# Patient Record
Sex: Male | Born: 1935 | Race: White | Hispanic: No | Smoking: Never smoker
Health system: Southern US, Community
[De-identification: ages and names within clinical notes are randomized; demographics above are authoritative.]

## PROBLEM LIST (undated history)

## (undated) DIAGNOSIS — G8929 Other chronic pain: Secondary | ICD-10-CM

## (undated) DIAGNOSIS — I714 Abdominal aortic aneurysm, without rupture, unspecified: Secondary | ICD-10-CM

## (undated) DIAGNOSIS — M545 Low back pain, unspecified: Secondary | ICD-10-CM

## (undated) DIAGNOSIS — C801 Malignant (primary) neoplasm, unspecified: Secondary | ICD-10-CM

## (undated) DIAGNOSIS — I2510 Atherosclerotic heart disease of native coronary artery without angina pectoris: Secondary | ICD-10-CM

## (undated) DIAGNOSIS — I10 Essential (primary) hypertension: Secondary | ICD-10-CM

## (undated) DIAGNOSIS — C679 Malignant neoplasm of bladder, unspecified: Secondary | ICD-10-CM

## (undated) DIAGNOSIS — E785 Hyperlipidemia, unspecified: Secondary | ICD-10-CM

## (undated) DIAGNOSIS — J984 Other disorders of lung: Secondary | ICD-10-CM

## (undated) DIAGNOSIS — K219 Gastro-esophageal reflux disease without esophagitis: Secondary | ICD-10-CM

## (undated) DIAGNOSIS — I739 Peripheral vascular disease, unspecified: Secondary | ICD-10-CM

## (undated) DIAGNOSIS — R1310 Dysphagia, unspecified: Secondary | ICD-10-CM

## (undated) HISTORY — DX: Other disorders of lung: J98.4

## (undated) HISTORY — PX: ABDOMINAL AORTIC ANEURYSM REPAIR: SUR1152

## (undated) HISTORY — DX: Hyperlipidemia, unspecified: E78.5

## (undated) HISTORY — DX: Essential (primary) hypertension: I10

## (undated) HISTORY — DX: Abdominal aortic aneurysm, without rupture: I71.4

## (undated) HISTORY — DX: Other chronic pain: G89.29

## (undated) HISTORY — DX: Low back pain: M54.5

## (undated) HISTORY — PX: INGUINAL HERNIA REPAIR: SUR1180

## (undated) HISTORY — DX: Gastro-esophageal reflux disease without esophagitis: K21.9

## (undated) HISTORY — PX: ABDOMINAL AORTIC ENDOVASCULAR STENT GRAFT: SHX5707

## (undated) HISTORY — DX: Atherosclerotic heart disease of native coronary artery without angina pectoris: I25.10

## (undated) HISTORY — PX: CORONARY ARTERY BYPASS GRAFT: SHX141

## (undated) HISTORY — DX: Peripheral vascular disease, unspecified: I73.9

## (undated) HISTORY — DX: Low back pain, unspecified: M54.50

## (undated) HISTORY — DX: Abdominal aortic aneurysm, without rupture, unspecified: I71.40

## (undated) HISTORY — DX: Malignant neoplasm of bladder, unspecified: C67.9

---

## 2002-10-05 HISTORY — PX: CORONARY ARTERY BYPASS GRAFT: SHX141

## 2003-10-06 HISTORY — PX: ABDOMINAL AORTIC ANEURYSM REPAIR: SUR1152

## 2005-09-21 ENCOUNTER — Ambulatory Visit (HOSPITAL_COMMUNITY): Admission: RE | Admit: 2005-09-21 | Discharge: 2005-09-21 | Payer: Self-pay | Admitting: Urology

## 2005-10-05 DIAGNOSIS — C801 Malignant (primary) neoplasm, unspecified: Secondary | ICD-10-CM

## 2005-10-05 HISTORY — DX: Malignant (primary) neoplasm, unspecified: C80.1

## 2005-10-05 HISTORY — PX: TRANSURETHRAL RESECTION OF BLADDER: SUR1395

## 2005-10-09 ENCOUNTER — Encounter (INDEPENDENT_AMBULATORY_CARE_PROVIDER_SITE_OTHER): Payer: Self-pay | Admitting: *Deleted

## 2005-10-09 ENCOUNTER — Encounter (INDEPENDENT_AMBULATORY_CARE_PROVIDER_SITE_OTHER): Payer: Self-pay | Admitting: Specialist

## 2005-10-09 ENCOUNTER — Ambulatory Visit (HOSPITAL_COMMUNITY): Admission: RE | Admit: 2005-10-09 | Discharge: 2005-10-10 | Payer: Self-pay | Admitting: Urology

## 2006-10-08 ENCOUNTER — Encounter: Payer: Self-pay | Admitting: Emergency Medicine

## 2008-10-16 ENCOUNTER — Encounter: Payer: Self-pay | Admitting: Emergency Medicine

## 2010-05-06 ENCOUNTER — Encounter (INDEPENDENT_AMBULATORY_CARE_PROVIDER_SITE_OTHER): Payer: Self-pay | Admitting: Internal Medicine

## 2010-05-06 ENCOUNTER — Observation Stay (HOSPITAL_COMMUNITY): Admission: EM | Admit: 2010-05-06 | Discharge: 2010-05-09 | Payer: Self-pay | Admitting: Emergency Medicine

## 2010-05-06 ENCOUNTER — Ambulatory Visit: Payer: Self-pay | Admitting: Pulmonary Disease

## 2010-05-07 ENCOUNTER — Ambulatory Visit: Payer: Self-pay | Admitting: Vascular Surgery

## 2010-05-07 ENCOUNTER — Encounter (INDEPENDENT_AMBULATORY_CARE_PROVIDER_SITE_OTHER): Payer: Self-pay | Admitting: Internal Medicine

## 2010-05-27 DIAGNOSIS — C679 Malignant neoplasm of bladder, unspecified: Secondary | ICD-10-CM | POA: Insufficient documentation

## 2010-05-27 DIAGNOSIS — I739 Peripheral vascular disease, unspecified: Secondary | ICD-10-CM

## 2010-05-27 DIAGNOSIS — I2510 Atherosclerotic heart disease of native coronary artery without angina pectoris: Secondary | ICD-10-CM | POA: Insufficient documentation

## 2010-05-27 DIAGNOSIS — K219 Gastro-esophageal reflux disease without esophagitis: Secondary | ICD-10-CM | POA: Insufficient documentation

## 2010-05-27 DIAGNOSIS — I10 Essential (primary) hypertension: Secondary | ICD-10-CM | POA: Insufficient documentation

## 2010-05-28 ENCOUNTER — Ambulatory Visit: Payer: Self-pay | Admitting: Emergency Medicine

## 2010-05-28 DIAGNOSIS — I714 Abdominal aortic aneurysm, without rupture, unspecified: Secondary | ICD-10-CM | POA: Insufficient documentation

## 2010-05-28 DIAGNOSIS — J984 Other disorders of lung: Secondary | ICD-10-CM | POA: Insufficient documentation

## 2010-05-28 DIAGNOSIS — F17200 Nicotine dependence, unspecified, uncomplicated: Secondary | ICD-10-CM

## 2010-06-10 ENCOUNTER — Ambulatory Visit: Payer: Self-pay | Admitting: Internal Medicine

## 2010-06-10 DIAGNOSIS — E785 Hyperlipidemia, unspecified: Secondary | ICD-10-CM | POA: Insufficient documentation

## 2010-06-10 LAB — CONVERTED CEMR LAB: LDL Goal: 100 mg/dL

## 2010-08-12 ENCOUNTER — Ambulatory Visit: Payer: Self-pay | Admitting: Emergency Medicine

## 2010-08-12 LAB — CONVERTED CEMR LAB
BUN: 14 mg/dL (ref 6–23)
Chloride: 103 meq/L (ref 96–112)
Creatinine, Ser: 0.8 mg/dL (ref 0.4–1.5)

## 2010-08-20 ENCOUNTER — Ambulatory Visit: Payer: Self-pay | Admitting: Cardiology

## 2010-10-15 ENCOUNTER — Ambulatory Visit
Admission: RE | Admit: 2010-10-15 | Discharge: 2010-10-15 | Payer: Self-pay | Source: Home / Self Care | Attending: Internal Medicine | Admitting: Internal Medicine

## 2010-10-15 DIAGNOSIS — K625 Hemorrhage of anus and rectum: Secondary | ICD-10-CM | POA: Insufficient documentation

## 2010-10-15 DIAGNOSIS — K648 Other hemorrhoids: Secondary | ICD-10-CM | POA: Insufficient documentation

## 2010-10-22 ENCOUNTER — Encounter: Payer: Self-pay | Admitting: Internal Medicine

## 2010-11-06 NOTE — Letter (Signed)
Summary: [facility]  [facility]   Imported By: Lester Gramling 06/27/2010 08:23:51  _____________________________________________________________________  External Attachment:    Type:   Image     Comment:   External Document

## 2010-11-06 NOTE — Miscellaneous (Signed)
  Clinical Lists Changes  Orders: Added new Service order of Flu Vaccine 57yrs + MEDICARE PATIENTS ([id number]) - Signed Added new Service order of Administration Flu vaccine - MCR ([id number]) - Signed Added new Service order of Tdap => 9yrs IM (14782) - Signed Added new Service order of Admin 1st Vaccine (95621) - Signed Observations: Added new observation of TD BOOST VIS: 08/22/08 version given June 10, 2010. (06/10/2010 12:42) Added new observation of TD BOOSTERLO: [id number] (06/10/2010 12:42) Added new observation of TD BOOST EXP: 07/31/2010 (06/10/2010 12:42) Added new observation of TD BOOSTERBY: Lakisha Archie CMA (06/10/2010 12:42) Added new observation of TD BOOSTERRT: IM (06/10/2010 12:42) Added new observation of TDBOOSTERDSE: 0.5 ml (06/10/2010 12:42) Added new observation of TD BOOSTERMF: GlaxoSmithKline (06/10/2010 12:42) Added new observation of TD BOOST SIT: right deltoid (06/10/2010 12:42) Added new observation of TD BOOSTER: Tdap (06/10/2010 12:42) Added new observation of FLU VAX VIS: 04/29/2010 version (06/10/2010 12:42) Added new observation of FLU VAXLOT: [id number] (06/10/2010 12:42) Added new observation of FLU VAXMFR: Glaxosmithkline (06/10/2010 12:42) Added new observation of FLU VAX EXP: 04/04/2011 (06/10/2010 12:42) Added new observation of FLU VAX DSE: 0.38ml (06/10/2010 12:42) Added new observation of FLU VAX: Fluvax 3+ (06/10/2010 12:42)Flu Vaccine Consent Questions     Do you have a history of severe allergic reactions to this vaccine? no    Any prior history of allergic reactions to egg and/or gelatin? no    Do you have a sensitivity to the preservative Thimersol? no    Do you have a past history of Guillan-Barre Syndrome? no    Do you currently have an acute febrile illness? no    Have you ever had a severe reaction to latex? no    Vaccine information given and explained to patient? yes    Are you currently pregnant? no    Lot Number:[id number]   Exp  Date:04/04/2011   Site Given  Left Deltoid IMbservation of FLU VAX EXP: 04/04/2011 (06/10/2010 12:42) Added new observation of FLU VAX DSE: 0.52ml (06/10/2010 12:42) Added new observation of FLU VAX: Fluvax 3+ (06/10/2010 12:42)    .lbmedflu    Immunizations Administered:  Tetanus Vaccine:    Vaccine Type: Tdap    Site: right deltoid    Mfr: GlaxoSmithKline    Dose: 0.5 ml    Route: IM    Given by: Rock Nephew CMA    Exp. Date: 07/31/2010    Lot #: [id number]    VIS given: 08/22/08 version given June 10, 2010.

## 2010-11-06 NOTE — Assessment & Plan Note (Signed)
Summary: RECTUM BLEEDING X 2 - 3 WKS--STC   Vital Signs:  Patient profile:   77 year old male Height:      71 inches Weight:      217 pounds BMI:     30.37 O2 Sat:      95 % on Room air Temp:     97.5 degrees F oral Pulse rate:   72 / minute Pulse rhythm:   regular Resp:     16 per minute BP sitting:   128 / 78  (left arm) Cuff size:   large  Vitals Entered By: Rock Nephew CMA (October 15, 2010 11:05 AM)  Nutrition Counseling: Patient's BMI is greater than 25 and therefore counseled on weight management options.  O2 Flow:  Room air CC: Pt c/o bleeding from rectum x 2 wks, Hypertension Management Is Patient Diabetic? No Pain Assessment Patient in pain? no        Primary Care Provider:  Etta Grandchild MD  CC:  Pt c/o bleeding from rectum x 2 wks and Hypertension Management.  History of Present Illness: He returns c/o a several month hx. of painless rectal bleeding, with bright red blood associated with bowel movements. He has been trying Prep H without much relief. He rarely has constipation that resolves after a few doses of a stool softener.  Hypertension History:      He denies headache, chest pain, palpitations, dyspnea with exertion, orthopnea, PND, peripheral edema, visual symptoms, neurologic problems, syncope, and side effects from treatment.  He notes no problems with any antihypertensive medication side effects.        Positive major cardiovascular risk factors include male age 48 years old or older, hyperlipidemia, and hypertension.  Negative major cardiovascular risk factors include negative family history for ischemic heart disease and non-tobacco-user status.        Positive history for target organ damage include ASHD (either angina/prior MI/prior CABG) and peripheral vascular disease.  Further assessment for target organ damage reveals no history of stroke/TIA.     Current Medications (verified): 1)  Xanax 0.5 Mg Tabs (Alprazolam) .Marland Kitchen.. 1 By Mouth Three  Times A Day As Needed 2)  Aspirin 81 Mg Tabs (Aspirin) .Marland Kitchen.. 1 By Mouth Daily 3)  Hydrochlorothiazide 25 Mg Tabs (Hydrochlorothiazide) .Marland Kitchen.. 1 By Mouth Daily 4)  Omeprazole 20 Mg Cpdr (Omeprazole) .Marland Kitchen.. 1 By Mouth Two Times A Day 5)  Ranitidine Hcl 150 Mg Caps (Ranitidine Hcl) .Marland Kitchen.. 1 By Mouth Two Times A Day 6)  Crestor 20 Mg Tabs (Rosuvastatin Calcium) .... Take 1 Tab By Mouth At Bedtime 7)  Fish Oil .Marland Kitchen.. 1-2 Three Times A Day  Allergies (verified): No Known Drug Allergies  Past History:  Past Medical History: Last updated: 06/10/2010 Current Problems:  ABDOMINAL ANEURYSM WITHOUT MENTION OF RUPTURE (ICD-441.4) PULMONARY NODULE (ICD-518.89) BLADDER CANCER (ICD-188.9) CORONARY ARTERY DISEASE (ICD-414.00) PERIPHERAL VASCULAR DISEASE (ICD-443.9) HYPERTENSION (ICD-401.9) G E R D (ICD-530.81) Hyperlipidemia  Past Surgical History: Last updated: 05/27/2010 C A B G: S/P AAA repair Bilateral inguinal hernia repair  Family History: Last updated: 05/27/2010 Family History Hypertension Family History Prostate Cancer  Social History: Last updated: 05/28/2010 Widowed Grandson lives with the patient No ETOH use Patient states former smoker.  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  significant 2nd hand smoker exposure.   Risk Factors: Alcohol Use: 0 (06/10/2010)  Risk Factors: Smoking Status: quit (06/10/2010) Packs/Day: 1.0 (05/28/2010)  Family History: Reviewed history from 05/27/2010 and no changes required.  Family History Hypertension Family History Prostate Cancer  Social History: Reviewed history from 05/28/2010 and no changes required. Widowed Grandson lives with the patient No ETOH use Patient states former smoker.  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  significant 2nd hand smoker exposure.   Review of Systems       The patient complains of hematochezia.  The patient denies anorexia, fever, weight  loss, weight gain, chest pain, syncope, dyspnea on exertion, peripheral edema, prolonged cough, headaches, hemoptysis, abdominal pain, melena, severe indigestion/heartburn, hematuria, suspicious skin lesions, unusual weight change, abnormal bleeding, and enlarged lymph nodes.   GI:  Complains of bloody stools, constipation, and hemorrhoids; denies abdominal pain, change in bowel habits, dark tarry stools, diarrhea, excessive appetite, gas, indigestion, loss of appetite, nausea, vomiting, vomiting blood, and yellowish skin color.  Physical Exam  General:  alert, well-developed, well-nourished, well-hydrated, appropriate dress, normal appearance, healthy-appearing, and cooperative to examination.   Head:  normocephalic, atraumatic, no abnormalities observed, and no abnormalities palpated.   Mouth:  Oral mucosa and oropharynx without lesions or exudates.  Teeth in good repair. Neck:  no masses, thyromegaly, or abnormal cervical nodes Lungs:  normal respiratory effort, no intercostal retractions, no accessory muscle use, normal breath sounds, no dullness, no crackles, and no wheezes.   Heart:  regular, 2/6 Mnormal rate, regular rhythm, no gallop, and no rub.   Abdomen:  soft, non-tender, normal bowel sounds, no distention, no masses, no guarding, no rigidity, no rebound tenderness, no hepatomegaly, no splenomegaly, abdominal scar(s), and incisional hernia.   Rectal:  normal sphincter tone, no masses, no tenderness, no fissures, no fistulae, no perianal rash, stool positive for occult blood, external hemorrhoid(s), and internal hemorrhoid(s).   Genitalia:  uncircumcised, no hydrocele, no varicocele, no scrotal masses, no testicular masses or atrophy, no cutaneous lesions, and no urethral discharge.   Prostate:  Prostate gland firm and smooth, no enlargement, nodularity, tenderness, mass, asymmetry or induration. Msk:  normal ROM, no joint tenderness, no joint swelling, no joint warmth, no redness over  joints, no joint deformities, no joint instability, no crepitation, and no muscle atrophy.   Pulses:  R and L carotid,radial,femoral,dorsalis pedis and posterior tibial pulses are full and equal bilaterally Extremities:  No clubbing, cyanosis, edema, or deformity noted with normal full range of motion of all joints.   Neurologic:  No cranial nerve deficits noted. Station and gait are normal. Plantar reflexes are down-going bilaterally. DTRs are symmetrical throughout. Sensory, motor and coordinative functions appear intact. Skin:  Intact without suspicious lesions or rashes Cervical Nodes:  no anterior cervical adenopathy and no posterior cervical adenopathy.   Axillary Nodes:  no R axillary adenopathy and no L axillary adenopathy.   Psych:  Cognition and judgment appear intact. Alert and cooperative with normal attention span and concentration. No apparent delusions, illusions, hallucinations   Impression & Recommendations:  Problem # 1:  RECTAL BLEEDING (ICD-569.3) Assessment New  Orders: DRE (G0102) Hemoccult Guaiac-1 spec.(in office) (82270)  Problem # 2:  INTERNAL HEMORRHOIDS (ICD-455.0) Assessment: New I think his hemorrhoids are large enough and cause enough symptoms that he should consider a surgical intervention. Orders: Surgical Referral (Surgery) DRE (G0102) Hemoccult Guaiac-1 spec.(in office) (82270)  Problem # 3:  HYPERTENSION (ICD-401.9) Assessment: Unchanged  His updated medication list for this problem includes:    Hydrochlorothiazide 25 Mg Tabs (Hydrochlorothiazide) .Marland Kitchen... 1 by mouth daily  BP today: 128/78 Prior BP: 112/70 (06/10/2010)  Prior 10 Yr Risk Heart Disease: N/A (06/10/2010)  Labs Reviewed: K+: 3.9 (08/12/2010) Creat: : 0.8 (08/12/2010)     Complete Medication List: 1)  Xanax 0.5 Mg Tabs (Alprazolam) .Marland Kitchen.. 1 by mouth three times a day as needed 2)  Aspirin 81 Mg Tabs (Aspirin) .Marland Kitchen.. 1 by mouth daily 3)  Hydrochlorothiazide 25 Mg Tabs  (Hydrochlorothiazide) .Marland Kitchen.. 1 by mouth daily 4)  Omeprazole 20 Mg Cpdr (Omeprazole) .Marland Kitchen.. 1 by mouth two times a day 5)  Ranitidine Hcl 150 Mg Caps (Ranitidine hcl) .Marland Kitchen.. 1 by mouth two times a day 6)  Crestor 20 Mg Tabs (Rosuvastatin calcium) .... Take 1 tab by mouth at bedtime 7)  Fish Oil  .Marland Kitchen.. 1-2 three times a day  Hypertension Assessment/Plan:      The patient's hypertensive risk group is category C: Target organ damage and/or diabetes.  Today's blood pressure is 128/78.  His blood pressure goal is < 140/90.  Patient Instructions: 1)  Please schedule a follow-up appointment in 1 month. 2)  Check your Blood Pressure regularly. If it is above 130/80: you should make an appointment.   Orders Added: 1)  Surgical Referral [Surgery] 2)  DRE [G0102] 3)  Hemoccult Guaiac-1 spec.(in office) [82270] 4)  Est. Patient Level IV [09811]

## 2010-11-06 NOTE — Assessment & Plan Note (Signed)
Summary: pulm nodules, LAD   Visit Type:  Hospital Follow-up Copy to:  [facility]  CC:  Hospital follow-up.  Miguel Swanson  History of Present Illness: 77 yo man, former smoker, hx HTN, CAD/CABG, GERD, thoracoabdominal AA, bladder CA followed by Dr Laverle Patter. Was hospitalized 8/2 - 8/6 for presyncope, found to be bradycardic and his b-blockade was stopped. Part of that workup included a CT chest abd and pelvis, showed bilateral scattered small (sub-cm) nodules with some mild paratracheal and mediastinal LAD. Has done fairly well since d/c from hosp, has had a single episode pre-syncope when standing. Remains off b-blocker. No breathing complaints. Some occas dry cough. Some nasal congestion and allergies. Able to get out and walk. No phlegm or hemoptysis. He believes that he has had imiaging in the past, [facility] or [facility] [place]   Preventive Screening-Counseling & Management  Alcohol-Tobacco     Smoking Status: quit     Smoking Cessation Counseling: yes     Smoke Cessation Stage: quit     Packs/Day: 1.0     Year Started: 1950?     Year Quit: 1977  Current Medications (verified): 1)  Xanax 0.5 Mg Tabs (Alprazolam) .Miguel Swanson.. 1 By Mouth Three Times A Day As Needed 2)  Aspirin 81 Mg Tabs (Aspirin) .Miguel Swanson.. 1 By Mouth Daily 3)  Gemfibrozil 600 Mg Tabs (Gemfibrozil) .Miguel Swanson.. 1 By Mouth Two Times A Day 4)  Hydrochlorothiazide 25 Mg Tabs (Hydrochlorothiazide) .Miguel Swanson.. 1 By Mouth Daily 5)  Omeprazole 20 Mg Cpdr (Omeprazole) .Miguel Swanson.. 1 By Mouth Two Times A Day 6)  Ranitidine Hcl 150 Mg Caps (Ranitidine Hcl) .Miguel Swanson.. 1 By Mouth Two Times A Day 7)  Simvastatin 80 Mg Tabs (Simvastatin) .Miguel Swanson.. 1 By Mouth At Bedtime  Allergies (verified): No Known Drug Allergies  Past History:  Past Medical History: Current Problems:  ABDOMINAL ANEURYSM WITHOUT MENTION OF RUPTURE (ICD-441.4) PULMONARY NODULE (ICD-518.89) BLADDER CANCER (ICD-188.9) CORONARY ARTERY DISEASE (ICD-414.00) PERIPHERAL VASCULAR DISEASE (ICD-443.9) HYPERTENSION  (ICD-401.9) G E R D (ICD-530.81)  Family History: Reviewed history from 05/27/2010 and no changes required. Family History Hypertension Family History Prostate Cancer  Social History: Reviewed history from 05/27/2010 and no changes required. Widowed Grandson lives with the patient No ETOH use Patient states former smoker.  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  significant 2nd hand smoker exposure.  Packs/Day:  1.0  Review of Systems       The patient complains of non-productive cough, nasal congestion/difficulty breathing through nose, and joint stiffness or pain.    Vital Signs:  Patient profile:   77 year old male Height:      71 inches (180.34 cm) Weight:      210.50 pounds (95.68 kg) BMI:     29.46 O2 Sat:      94 % on Room air Temp:     97.8 degrees F (36.56 degrees C) oral Pulse rate:   75 / minute BP sitting:   118 / 70  (left arm) Cuff size:   regular  Vitals Entered By: Michel Bickers CMA (May 28, 2010 9:00 AM)  O2 Sat at Rest %:  94 O2 Flow:  Room air CC: Hospital follow-up.   Comments Medications reviewed with the patient. Daytime phone verified. Michel Bickers CMA  May 28, 2010 9:16 AM   Physical Exam  General:  normal appearance, healthy appearing, and obese.   Head:  normocephalic and atraumatic Eyes:  conjunctiva and sclera clear Nose:  no deformity, discharge, inflammation,  or lesions Mouth:  no deformity or lesions Neck:  no masses, thyromegaly, or abnormal cervical nodes Lungs:  Few LLL insp crackles, otherwise clear.  Heart:  regular, 2/6 M Abdomen:  obese, benign Msk:  no deformity or scoliosis noted with normal posture Extremities:  no clubbing, cyanosis, edema, or deformity noted Neurologic:  non-focal Psych:  alert and cooperative; normal mood and affect; normal attention span and concentration   Impression & Recommendations:  Problem # 1:  PULMONARY NODULE (ICD-518.89) Conservative management at  this time, acknowledging the risk that this could be metastatic from bladder or primary lung CA - get old CT's from urology - repeat CT chest in November  Orders: Radiology Referral (Radiology) New Patient Level IV (16109)  Problem # 2:  Hx of TOBACCO ABUSE (ICD-305.1) - will defer meds or PFt' s for now as he does not feel limited by his breathing. May decide to pursue in future depending on symptoms.   Patient Instructions: 1)  We will repeat your CT scan of the chest in November 2011 to compare with 05/2010.  2)  We will obtain copies of your prior CT's from [facility] 3)  Follow with Dr Delton Coombes in November after your CT scan.

## 2010-11-06 NOTE — Assessment & Plan Note (Signed)
Summary: NEW/[employer]/#/LB   Vital Signs:  Patient profile:   77 year old male Height:      71 inches Weight:      205.75 pounds BMI:     28.80 O2 Sat:      95 % on Room air Temp:     97.6 degrees F oral Pulse rate:   73 / minute Pulse rhythm:   regular Resp:     16 per minute BP sitting:   112 / 70  (left arm) Cuff size:   large  Vitals Entered By: Rock Nephew CMA (June 10, 2010 11:01 AM)  Nutrition Counseling: Patient's BMI is greater than 25 and therefore counseled on weight management options.  O2 Flow:  Room air  Primary Care Jayin Derousse:  Etta Grandchild MD   History of Present Illness: New to me he needs a PCP. He was admitted one month ago after he had a spell of dizziness and diaphoresis, he was an inpt. for 7 days and was found to have 3 spots on his lungs that are being monitored for now. He feels well today and he tells me that he is back to his normal activities of gardening and yardwork.  Lipid Management History:      Positive NCEP/ATP III risk factors include male age 90 years old or older, hypertension, ASHD (either angina/prior MI/prior CABG), peripheral vascular disease, and aortic aneurysm.  Negative NCEP/ATP III risk factors include no family history for ischemic heart disease, non-tobacco-user status, and no prior stroke/TIA.        The patient states that he knows about the "Therapeutic Lifestyle Change" diet.  His compliance with the TLC diet is good.  The patient expresses understanding of adjunctive measures for cholesterol lowering.  Adjunctive measures started by the patient include aerobic exercise, fiber, ASA, omega-3 supplements, limit alcohol consumpton, and weight reduction.  He expresses no side effects from his lipid-lowering medication.  The patient denies any symptoms to suggest myopathy or liver disease.     Preventive Screening-Counseling & Management  Alcohol-Tobacco     Alcohol drinks/day: 0     Smoking Status:  quit     Year Quit: 1977     Tobacco Counseling: to remain off tobacco products  Hep-HIV-STD-Contraception     Hepatitis Risk: no risk noted     HIV Risk: no risk noted     STD Risk: no risk noted      Sexual History:  not active.        Drug Use:  never.        Blood Transfusions:  no.    Medications Prior to Update: 1)  Xanax 0.5 Mg Tabs (Alprazolam) .Marland Kitchen.. 1 By Mouth Three Times A Day As Needed 2)  Aspirin 81 Mg Tabs (Aspirin) .Marland Kitchen.. 1 By Mouth Daily 3)  Gemfibrozil 600 Mg Tabs (Gemfibrozil) .Marland Kitchen.. 1 By Mouth Two Times A Day 4)  Hydrochlorothiazide 25 Mg Tabs (Hydrochlorothiazide) .Marland Kitchen.. 1 By Mouth Daily 5)  Omeprazole 20 Mg Cpdr (Omeprazole) .Marland Kitchen.. 1 By Mouth Two Times A Day 6)  Ranitidine Hcl 150 Mg Caps (Ranitidine Hcl) .Marland Kitchen.. 1 By Mouth Two Times A Day 7)  Simvastatin 80 Mg Tabs (Simvastatin) .Marland Kitchen.. 1 By Mouth At Bedtime  Current Medications (verified): 1)  Xanax 0.5 Mg Tabs (Alprazolam) .Marland Kitchen.. 1 By Mouth Three Times A Day As Needed 2)  Aspirin 81 Mg Tabs (Aspirin) .Marland Kitchen.. 1 By Mouth Daily 3)  Hydrochlorothiazide 25 Mg Tabs (Hydrochlorothiazide) .Marland Kitchen.. 1 By  Mouth Daily 4)  Omeprazole 20 Mg Cpdr (Omeprazole) .Marland Kitchen.. 1 By Mouth Two Times A Day 5)  Ranitidine Hcl 150 Mg Caps (Ranitidine Hcl) .Marland Kitchen.. 1 By Mouth Two Times A Day 6)  Simvastatin 80 Mg Tabs (Simvastatin) .Marland Kitchen.. 1 By Mouth At Bedtime  Allergies (verified): No Known Drug Allergies  Past History:  Past Medical History: Current Problems:  ABDOMINAL ANEURYSM WITHOUT MENTION OF RUPTURE (ICD-441.4) PULMONARY NODULE (ICD-518.89) BLADDER CANCER (ICD-188.9) CORONARY ARTERY DISEASE (ICD-414.00) PERIPHERAL VASCULAR DISEASE (ICD-443.9) HYPERTENSION (ICD-401.9) G E R D (ICD-530.81) Hyperlipidemia  Social History: Hepatitis Risk:  no risk noted HIV Risk:  no risk noted STD Risk:  no risk noted Sexual History:  not active Drug Use:  never Blood Transfusions:  no  Review of Systems  The patient denies anorexia, fever, weight loss, weight  gain, chest pain, syncope, dyspnea on exertion, peripheral edema, prolonged cough, headaches, hemoptysis, abdominal pain, hematuria, suspicious skin lesions, difficulty walking, and depression.    Physical Exam  General:  alert, well-developed, well-nourished, well-hydrated, appropriate dress, normal appearance, healthy-appearing, and cooperative to examination.   Head:  normocephalic, atraumatic, no abnormalities observed, and no abnormalities palpated.   Mouth:  Oral mucosa and oropharynx without lesions or exudates.  Teeth in good repair. Neck:  no masses, thyromegaly, or abnormal cervical nodes Chest Wall:  no deformities, no masses, and sternotomy scar.   Lungs:  normal respiratory effort, no intercostal retractions, no accessory muscle use, normal breath sounds, no dullness, no crackles, and no wheezes.   Heart:  regular, 2/6 M Abdomen:  soft, non-tender, normal bowel sounds, no distention, no masses, no guarding, no rigidity, no rebound tenderness, no hepatomegaly, no splenomegaly, abdominal scar(s), and incisional hernia.   Msk:  normal ROM, no joint tenderness, no joint swelling, no joint warmth, no redness over joints, no joint deformities, no joint instability, no crepitation, and no muscle atrophy.   Pulses:  R and L carotid,radial,femoral,dorsalis pedis and posterior tibial pulses are full and equal bilaterally Extremities:  No clubbing, cyanosis, edema, or deformity noted with normal full range of motion of all joints.   Neurologic:  No cranial nerve deficits noted. Station and gait are normal. Plantar reflexes are down-going bilaterally. DTRs are symmetrical throughout. Sensory, motor and coordinative functions appear intact. Skin:  Intact without suspicious lesions or rashes Cervical Nodes:  no anterior cervical adenopathy and no posterior cervical adenopathy.   Axillary Nodes:  no R axillary adenopathy and no L axillary adenopathy.   Psych:  Cognition and judgment appear intact.  Alert and cooperative with normal attention span and concentration. No apparent delusions, illusions, hallucinations   Impression & Recommendations:  Problem # 1:  HYPERLIPIDEMIA (ICD-272.4) i stopped gemfib due to its hx of side effects and complications with myopathy, etc. The following medications were removed from the medication list:    Gemfibrozil 600 Mg Tabs (Gemfibrozil) .Marland Kitchen... 1 by mouth two times a day His updated medication list for this problem includes:    Simvastatin 80 Mg Tabs (Simvastatin) .Marland Kitchen... 1 by mouth at bedtime  Problem # 2:  HYPERTENSION (ICD-401.9) Assessment: Improved  His updated medication list for this problem includes:    Hydrochlorothiazide 25 Mg Tabs (Hydrochlorothiazide) .Marland Kitchen... 1 by mouth daily  BP today: 112/70 Prior BP: 118/70 (05/28/2010)  Complete Medication List: 1)  Xanax 0.5 Mg Tabs (Alprazolam) .Marland Kitchen.. 1 by mouth three times a day as needed 2)  Aspirin 81 Mg Tabs (Aspirin) .Marland Kitchen.. 1 by mouth daily 3)  Hydrochlorothiazide 25 Mg Tabs (Hydrochlorothiazide) .Marland KitchenMarland KitchenMarland Kitchen  1 by mouth daily 4)  Omeprazole 20 Mg Cpdr (Omeprazole) .Marland Kitchen.. 1 by mouth two times a day 5)  Ranitidine Hcl 150 Mg Caps (Ranitidine hcl) .Marland Kitchen.. 1 by mouth two times a day 6)  Simvastatin 80 Mg Tabs (Simvastatin) .Marland Kitchen.. 1 by mouth at bedtime  Lipid Assessment/Plan:      Based on NCEP/ATP III, the patient's risk factor category is "history of coronary disease, peripheral vascular disease, cerebrovascular disease, or aortic aneurysm".  The patient's lipid goals are as follows: Total cholesterol goal is 200; LDL cholesterol goal is 100; HDL cholesterol goal is 40; Triglyceride goal is 150.    Colorectal Screening:  Current Recommendations:    Hemoccult: NEG X 1 today  PSA Screening:    Reviewed PSA screening recommendations: patient defers PSA but will reconsider in the future  Patient Instructions: 1)  Please schedule a follow-up appointment in 3 months. 2)  It is important that you exercise  regularly at least 20 minutes 5 times a week. If you develop chest pain, have severe difficulty breathing, or feel very tired , stop exercising immediately and seek medical attention. 3)  You need to lose weight. Consider a lower calorie diet and regular exercise.   Preventive Care Screening  Colonoscopy:    Date:  10/05/2005    Results:  normal

## 2010-11-12 NOTE — Consult Note (Signed)
Summary: [facility]   Imported By: Sherian Rein 11/03/2010 15:00:09  _____________________________________________________________________  External Attachment:    Type:   Image     Comment:   External Document

## 2010-11-13 ENCOUNTER — Encounter: Payer: Self-pay | Admitting: Internal Medicine

## 2010-11-17 ENCOUNTER — Other Ambulatory Visit: Payer: 59

## 2010-11-17 ENCOUNTER — Other Ambulatory Visit: Payer: Self-pay | Admitting: Internal Medicine

## 2010-11-17 ENCOUNTER — Encounter: Payer: Self-pay | Admitting: Internal Medicine

## 2010-11-17 ENCOUNTER — Ambulatory Visit (INDEPENDENT_AMBULATORY_CARE_PROVIDER_SITE_OTHER): Payer: 59 | Admitting: Internal Medicine

## 2010-11-17 ENCOUNTER — Encounter (INDEPENDENT_AMBULATORY_CARE_PROVIDER_SITE_OTHER): Payer: Self-pay | Admitting: *Deleted

## 2010-11-17 DIAGNOSIS — C679 Malignant neoplasm of bladder, unspecified: Secondary | ICD-10-CM

## 2010-11-17 DIAGNOSIS — K219 Gastro-esophageal reflux disease without esophagitis: Secondary | ICD-10-CM

## 2010-11-17 DIAGNOSIS — K5900 Constipation, unspecified: Secondary | ICD-10-CM

## 2010-11-17 DIAGNOSIS — I10 Essential (primary) hypertension: Secondary | ICD-10-CM

## 2010-11-17 DIAGNOSIS — E785 Hyperlipidemia, unspecified: Secondary | ICD-10-CM

## 2010-11-17 LAB — CBC WITH DIFFERENTIAL/PLATELET
Basophils Relative: 0.8 % (ref 0.0–3.0)
Eosinophils Relative: 5.2 % — ABNORMAL HIGH (ref 0.0–5.0)
HCT: 44.1 % (ref 39.0–52.0)
Hemoglobin: 15.3 g/dL (ref 13.0–17.0)
Lymphs Abs: 3 10*3/uL (ref 0.7–4.0)
MCV: 96.7 fl (ref 78.0–100.0)
Monocytes Absolute: 0.6 10*3/uL (ref 0.1–1.0)
Neutro Abs: 4.1 10*3/uL (ref 1.4–7.7)
RBC: 4.56 Mil/uL (ref 4.22–5.81)
WBC: 8.2 10*3/uL (ref 4.5–10.5)

## 2010-11-17 LAB — HEPATIC FUNCTION PANEL
ALT: 20 U/L (ref 0–53)
AST: 18 U/L (ref 0–37)
Total Protein: 7.1 g/dL (ref 6.0–8.3)

## 2010-11-17 LAB — CONVERTED CEMR LAB: Bacteria, UA: NONE SEEN

## 2010-11-17 LAB — LIPID PANEL
Cholesterol: 148 mg/dL (ref 0–200)
LDL Cholesterol: 87 mg/dL (ref 0–99)
Total CHOL/HDL Ratio: 4

## 2010-11-17 LAB — TSH: TSH: 2.7 u[IU]/mL (ref 0.35–5.50)

## 2010-11-17 LAB — BASIC METABOLIC PANEL
BUN: 12 mg/dL (ref 6–23)
Chloride: 96 mEq/L (ref 96–112)
Potassium: 3.7 mEq/L (ref 3.5–5.1)
Sodium: 139 mEq/L (ref 135–145)

## 2010-11-26 NOTE — Assessment & Plan Note (Signed)
Summary: 1 MO FU / Miguel Swanson   Vital Signs:  Patient profile:   77 year old male Height:      71 inches Weight:      212 pounds BMI:     29.67 O2 Sat:      95 % on Room air Temp:     97.4 degrees F oral Pulse rate:   64 / minute Pulse rhythm:   regular Resp:     16 per minute BP sitting:   136 / 80  (left arm) Cuff size:   large  Vitals Entered By: Rock Nephew CMA (November 17, 2010 8:45 AM)  Nutrition Counseling: Patient's BMI is greater than 25 and therefore counseled on weight management options.  O2 Flow:  Room air CC: follow-up visit, Preventive Care, Lipid Management, Abdominal Pain Is Patient Diabetic? No Pain Assessment Patient in pain? no        Primary Care Provider:  Etta Grandchild MD  CC:  follow-up visit, Preventive Care, Lipid Management, and Abdominal Pain.  History of Present Illness: He returns to me c/o constipation and he tells me that he saw Dr. Abbey Chatters last week and he is having some rubber band ligations of his hemorrhoids done. He had some bleeding last week after the procedure was done and mild discomfort but that has improved. He tells me that for the last few days his stools have been hard and difficult to pass.  Dyspepsia History:      There is a prior history of GERD.  The patient does not have a prior history of documented ulcer disease.  The dominant symptom is heartburn or acid reflux.  An H-2 blocker medication is currently being taken.  He notes that the symptoms have improved with the H-2 blocker therapy.  Symptoms have not persisted after 4 weeks of H-2 blocker treatment.  He has no history of a positive H. Pylori serology.  No previous upper endoscopy has been done.    Lipid Management History:      Positive NCEP/ATP III risk factors include male age 91 years old or older, hypertension, ASHD (either angina/prior MI/prior CABG), peripheral vascular disease, and aortic aneurysm.  Negative NCEP/ATP III risk factors include no family  history for ischemic heart disease, non-tobacco-user status, and no prior stroke/TIA.        The patient states that he knows about the "Therapeutic Lifestyle Change" diet.  His compliance with the TLC diet is good.  The patient expresses understanding of adjunctive measures for cholesterol lowering.  Adjunctive measures started by the patient include aerobic exercise, fiber, limit alcohol consumpton, and weight reduction.  He expresses no side effects from his lipid-lowering medication.  The patient denies any symptoms to suggest myopathy or liver disease.    Preventive Screening-Counseling & Management  Alcohol-Tobacco     Alcohol drinks/day: 0     Alcohol Counseling: not indicated; patient does not drink     Smoking Status: quit     Smoking Cessation Counseling: yes     Smoke Cessation Stage: quit     Packs/Day: 1.0     Year Started: 1950?     Year Quit: 1977     Tobacco Counseling: to remain off tobacco products  Hep-HIV-STD-Contraception     Hepatitis Risk: no risk noted     HIV Risk: no risk noted     STD Risk: no risk noted      Sexual History:  not active.  Drug Use:  never.        Blood Transfusions:  no.    Clinical Review Panels:  Prevention   Last Colonoscopy:  normal (10/05/2005)  Immunizations   Last Tetanus Booster:  Tdap (06/10/2010)   Last Flu Vaccine:  Fluvax 3+ (06/10/2010)  Diabetes Management   Creatinine:  0.8 (08/12/2010)   Last Flu Vaccine:  Fluvax 3+ (06/10/2010)  Complete Metabolic Panel   Glucose:  114 (08/12/2010)   Sodium:  138 (08/12/2010)   Potassium:  3.9 (08/12/2010)   Chloride:  103 (08/12/2010)   CO2:  28 (08/12/2010)   BUN:  14 (08/12/2010)   Creatinine:  0.8 (08/12/2010)   Calcium:  9.1 (08/12/2010)   Medications Prior to Update: 1)  Xanax 0.5 Mg Tabs (Alprazolam) .Marland Kitchen.. 1 By Mouth Three Times A Day As Needed 2)  Aspirin 81 Mg Tabs (Aspirin) .Marland Kitchen.. 1 By Mouth Daily 3)  Hydrochlorothiazide 25 Mg Tabs (Hydrochlorothiazide)  .Marland Kitchen.. 1 By Mouth Daily 4)  Omeprazole 20 Mg Cpdr (Omeprazole) .Marland Kitchen.. 1 By Mouth Two Times A Day 5)  Ranitidine Hcl 150 Mg Caps (Ranitidine Hcl) .Marland Kitchen.. 1 By Mouth Two Times A Day 6)  Crestor 20 Mg Tabs (Rosuvastatin Calcium) .... Take 1 Tab By Mouth At Bedtime 7)  Fish Oil .Marland Kitchen.. 1-2 Three Times A Day  Current Medications (verified): 1)  Xanax 0.5 Mg Tabs (Alprazolam) .Marland Kitchen.. 1 By Mouth Three Times A Day As Needed 2)  Aspirin 81 Mg Tabs (Aspirin) .Marland Kitchen.. 1 By Mouth Daily 3)  Hydrochlorothiazide 25 Mg Tabs (Hydrochlorothiazide) .Marland Kitchen.. 1 By Mouth Daily 4)  Omeprazole 20 Mg Cpdr (Omeprazole) .Marland Kitchen.. 1 By Mouth Two Times A Day 5)  Ranitidine Hcl 150 Mg Caps (Ranitidine Hcl) .Marland Kitchen.. 1 By Mouth Two Times A Day 6)  Crestor 20 Mg Tabs (Rosuvastatin Calcium) .... Take 1 Tab By Mouth At Bedtime 7)  Fish Oil .Marland Kitchen.. 1-2 Three Times A Day 8)  Preparation H 0.25-3-14-71.9 % Oint (Phenyleph-Shark Liv Oil-Mo-Pet) 9)  Ibuprofen 10)  Miralax  Powd (Polyethylene Glycol 3350) .... One Scoop in 8 Ounces of Juice Once Daily For Constipation  Allergies (verified): No Known Drug Allergies  Past History:  Past Medical History: Last updated: 06/10/2010 Current Problems:  ABDOMINAL ANEURYSM WITHOUT MENTION OF RUPTURE (ICD-441.4) PULMONARY NODULE (ICD-518.89) BLADDER CANCER (ICD-188.9) CORONARY ARTERY DISEASE (ICD-414.00) PERIPHERAL VASCULAR DISEASE (ICD-443.9) HYPERTENSION (ICD-401.9) G E R D (ICD-530.81) Hyperlipidemia  Past Surgical History: Last updated: 05/27/2010 C A B G: S/P AAA repair Bilateral inguinal hernia repair  Family History: Last updated: 05/27/2010 Family History Hypertension Family History Prostate Cancer  Social History: Last updated: 05/28/2010 Widowed Grandson lives with the patient No ETOH use Patient states former smoker.  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  significant 2nd hand smoker exposure.   Risk Factors: Alcohol Use: 0 (11/17/2010)  Risk  Factors: Smoking Status: quit (11/17/2010) Packs/Day: 1.0 (11/17/2010)  Family History: Reviewed history from 05/27/2010 and no changes required. Family History Hypertension Family History Prostate Cancer  Social History: Reviewed history from 05/28/2010 and no changes required. Widowed Grandson lives with the patient No ETOH use Patient states former smoker.  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  significant 2nd hand smoker exposure.   Review of Systems  The patient denies anorexia, fever, weight loss, weight gain, chest pain, syncope, dyspnea on exertion, peripheral edema, prolonged cough, headaches, hemoptysis, abdominal pain, melena, hematochezia, severe indigestion/heartburn, hematuria, muscle weakness, suspicious skin lesions, difficulty walking, depression, enlarged  lymph nodes, and angioedema.   GI:  Complains of change in bowel habits and constipation; denies abdominal pain, bloody stools, dark tarry stools, diarrhea, gas, hemorrhoids, indigestion, loss of appetite, nausea, vomiting, vomiting blood, and yellowish skin color. GU:  Denies discharge, dysuria, erectile dysfunction, hematuria, incontinence, nocturia, urinary frequency, and urinary hesitancy.  Physical Exam  General:  alert, well-developed, well-nourished, well-hydrated, appropriate dress, normal appearance, healthy-appearing, cooperative to examination, and good hygiene.   Head:  normocephalic, atraumatic, no abnormalities observed, and no abnormalities palpated.   Mouth:  Oral mucosa and oropharynx without lesions or exudates.  Teeth in good repair. Neck:  supple, full ROM, no masses, no thyromegaly, no thyroid nodules or tenderness, no JVD, normal carotid upstroke, no carotid bruits, no cervical lymphadenopathy, and no neck tenderness.   Lungs:  normal respiratory effort, no intercostal retractions, no accessory muscle use, normal breath sounds, no dullness, no crackles, and no  wheezes.   Heart:  regular, 2/6 Mnormal rate, regular rhythm, no gallop, and no rub.   Abdomen:  soft, non-tender, normal bowel sounds, no distention, no masses, no guarding, no rigidity, no rebound tenderness, no hepatomegaly, no splenomegaly, abdominal scar(s), and incisional hernia.   Msk:  normal ROM, no joint tenderness, no joint swelling, no joint warmth, no redness over joints, no joint deformities, no joint instability, no crepitation, and no muscle atrophy.   Pulses:  R and L carotid,radial,femoral,dorsalis pedis and posterior tibial pulses are full and equal bilaterally Extremities:  No clubbing, cyanosis, edema, or deformity noted with normal full range of motion of all joints.   Neurologic:  No cranial nerve deficits noted. Station and gait are normal. Plantar reflexes are down-going bilaterally. DTRs are symmetrical throughout. Sensory, motor and coordinative functions appear intact. Skin:  Intact without suspicious lesions or rashes Cervical Nodes:  no anterior cervical adenopathy and no posterior cervical adenopathy.   Axillary Nodes:  no R axillary adenopathy and no L axillary adenopathy.   Psych:  Cognition and judgment appear intact. Alert and cooperative with normal attention span and concentration. No apparent delusions, illusions, hallucinations   Impression & Recommendations:  Problem # 1:  CONSTIPATION (ICD-564.00) Assessment New  His updated medication list for this problem includes:    Miralax Powd (Polyethylene glycol 3350) ..... One scoop in 8 ounces of juice once daily for constipation  Orders: Venipuncture (16109) TLB-Lipid Panel (80061-LIPID) TLB-BMP (Basic Metabolic Panel-BMET) (80048-METABOL) TLB-CBC Platelet - w/Differential (85025-CBCD) TLB-Hepatic/Liver Function Pnl (80076-HEPATIC) TLB-TSH (Thyroid Stimulating Hormone) (84443-TSH) T-Urine Microscopic (60454-09811)  Problem # 2:  HYPERLIPIDEMIA (ICD-272.4) Assessment: Unchanged  His updated  medication list for this problem includes:    Crestor 20 Mg Tabs (Rosuvastatin calcium) .Marland Kitchen... Take 1 tab by mouth at bedtime  Orders: Venipuncture (91478) TLB-Lipid Panel (80061-LIPID) TLB-BMP (Basic Metabolic Panel-BMET) (80048-METABOL) TLB-CBC Platelet - w/Differential (85025-CBCD) TLB-Hepatic/Liver Function Pnl (80076-HEPATIC) TLB-TSH (Thyroid Stimulating Hormone) (84443-TSH) T-Urine Microscopic (29562-13086)  Problem # 3:  BLADDER CANCER (ICD-188.9) Assessment: Unchanged  Orders: Venipuncture (57846) TLB-Lipid Panel (80061-LIPID) TLB-BMP (Basic Metabolic Panel-BMET) (80048-METABOL) TLB-CBC Platelet - w/Differential (85025-CBCD) TLB-Hepatic/Liver Function Pnl (80076-HEPATIC) TLB-TSH (Thyroid Stimulating Hormone) (84443-TSH) T-Urine Microscopic (96295-28413)  Problem # 4:  HYPERTENSION (ICD-401.9) Assessment: Improved  His updated medication list for this problem includes:    Hydrochlorothiazide 25 Mg Tabs (Hydrochlorothiazide) .Marland Kitchen... 1 by mouth daily  Orders: Venipuncture (24401) TLB-Lipid Panel (80061-LIPID) TLB-BMP (Basic Metabolic Panel-BMET) (80048-METABOL) TLB-CBC Platelet - w/Differential (85025-CBCD) TLB-Hepatic/Liver Function Pnl (80076-HEPATIC) TLB-TSH (Thyroid Stimulating Hormone) (84443-TSH) T-Urine Microscopic (02725-36644)  BP today: 136/80 Prior BP: 128/78 (  10/15/2010)  Prior 10 Yr Risk Heart Disease: N/A (06/10/2010)  Labs Reviewed: K+: 3.9 (08/12/2010) Creat: : 0.8 (08/12/2010)     Problem # 5:  G E R D (ICD-530.81) Assessment: Improved  His updated medication list for this problem includes:    Omeprazole 20 Mg Cpdr (Omeprazole) .Marland Kitchen... 1 by mouth two times a day    Ranitidine Hcl 150 Mg Caps (Ranitidine hcl) .Marland Kitchen... 1 by mouth two times a day  Orders: Venipuncture (21308) TLB-Lipid Panel (80061-LIPID) TLB-BMP (Basic Metabolic Panel-BMET) (80048-METABOL) TLB-CBC Platelet - w/Differential (85025-CBCD) TLB-Hepatic/Liver Function Pnl  (80076-HEPATIC) TLB-TSH (Thyroid Stimulating Hormone) (84443-TSH) T-Urine Microscopic (65784-69629)  Complete Medication List: 1)  Xanax 0.5 Mg Tabs (Alprazolam) .Marland Kitchen.. 1 by mouth three times a day as needed 2)  Aspirin 81 Mg Tabs (Aspirin) .Marland Kitchen.. 1 by mouth daily 3)  Hydrochlorothiazide 25 Mg Tabs (Hydrochlorothiazide) .Marland Kitchen.. 1 by mouth daily 4)  Omeprazole 20 Mg Cpdr (Omeprazole) .Marland Kitchen.. 1 by mouth two times a day 5)  Ranitidine Hcl 150 Mg Caps (Ranitidine hcl) .Marland Kitchen.. 1 by mouth two times a day 6)  Crestor 20 Mg Tabs (Rosuvastatin calcium) .... Take 1 tab by mouth at bedtime 7)  Fish Oil  .Marland Kitchen.. 1-2 three times a day 8)  Preparation H 0.25-3-14-71.9 % Oint (Phenyleph-shark liv oil-mo-pet) 9)  Ibuprofen  10)  Miralax Powd (Polyethylene glycol 3350) .... One scoop in 8 ounces of juice once daily for constipation  Lipid Assessment/Plan:      Based on NCEP/ATP III, the patient's risk factor category is "history of coronary disease, peripheral vascular disease, cerebrovascular disease, or aortic aneurysm".  The patient's lipid goals are as follows: Total cholesterol goal is 200; LDL cholesterol goal is 100; HDL cholesterol goal is 40; Triglyceride goal is 150.    Colorectal Screening:  Current Recommendations:    Hemoccult: patient refused  PSA Screening:    Reviewed PSA screening recommendations: patient defers PSA but will reconsider in the future  Immunization & Chemoprophylaxis:    Tetanus vaccine: Tdap  (06/10/2010)    Influenza vaccine: Fluvax 3+  (06/10/2010)    Patient Instructions: 1)  Please schedule a follow-up appointment in 2 months. 2)  Check your Blood Pressure regularly. If it is above 130/80: you should make an appointment. Prescriptions: MIRALAX  POWD (POLYETHYLENE GLYCOL 3350) One scoop in 8 ounces of juice once daily for constipation  #1 month x 11   Entered and Authorized by:   Etta Grandchild MD   Signed by:   Etta Grandchild MD on 11/17/2010   Method used:   Print then  Give to Patient   RxID:   [id number]  POWD (POLYETHYLENE GLYCOL 3350) One scoop in 8 ounces of juice once daily for constipation  #1 month x 11   Entered and Authorized by:   Etta Grandchild MD   Signed by:   Etta Grandchild MD on 11/17/2010   Method used:   Electronically to        [facility] [id number] (retail)       [address] [place], [place]  [address]       Ph: [phone]       Fax: [phone]   RxID:   [id number]    Orders Added: 1)  Venipuncture [73220] 2)  TLB-Lipid Panel [80061-LIPID] 3)  TLB-BMP (Basic Metabolic Panel-BMET) [80048-METABOL] 4)  TLB-CBC Platelet - w/Differential [85025-CBCD] 5)  TLB-Hepatic/Liver Function Pnl [80076-HEPATIC] 6)  TLB-TSH (Thyroid Stimulating Hormone) [84443-TSH] 7)  T-Urine Microscopic [16109-60454] 8)  Est. Patient Level IV [09811]

## 2010-12-02 NOTE — Letter (Signed)
Summary: Adolph Pollack MD  Adolph Pollack MD   Imported By: Lester [place] 11/26/2010 08:03:00  _____________________________________________________________________  External Attachment:    Type:   Image     Comment:   External Document

## 2010-12-03 ENCOUNTER — Encounter: Payer: Self-pay | Admitting: Internal Medicine

## 2010-12-16 NOTE — Letter (Signed)
Summary: [facility]   Imported By: Sherian Rein 12/12/2010 11:46:50  _____________________________________________________________________  External Attachment:    Type:   Image     Comment:   External Document

## 2010-12-19 LAB — DIFFERENTIAL
Basophils Absolute: 0.1 10*3/uL (ref 0.0–0.1)
Eosinophils Relative: 3 % (ref 0–5)
Lymphocytes Relative: 29 % (ref 12–46)
Lymphs Abs: 1.8 10*3/uL (ref 0.7–4.0)
Neutro Abs: 3.7 10*3/uL (ref 1.7–7.7)

## 2010-12-19 LAB — POCT I-STAT, CHEM 8
BUN: 16 mg/dL (ref 6–23)
Chloride: 106 mEq/L (ref 96–112)
Creatinine, Ser: 0.8 mg/dL (ref 0.4–1.5)
Sodium: 140 mEq/L (ref 135–145)
TCO2: 22 mmol/L (ref 0–100)

## 2010-12-19 LAB — COMPREHENSIVE METABOLIC PANEL
BUN: 13 mg/dL (ref 6–23)
CO2: 22 mEq/L (ref 19–32)
Calcium: 8.7 mg/dL (ref 8.4–10.5)
Chloride: 106 mEq/L (ref 96–112)
Creatinine, Ser: 0.83 mg/dL (ref 0.4–1.5)
GFR calc Af Amer: >60 mL/min (ref >60)
GFR calc non Af Amer: >60 mL/min (ref >60)
Glucose, Bld: 125 mg/dL — ABNORMAL HIGH (ref 70–99)
Total Bilirubin: 0.9 mg/dL (ref 0.3–1.2)

## 2010-12-19 LAB — URINE CULTURE: Colony Count: 9000

## 2010-12-19 LAB — GLUCOSE, CAPILLARY
Glucose-Capillary: 118 mg/dL — ABNORMAL HIGH (ref 70–99)
Glucose-Capillary: 119 mg/dL — ABNORMAL HIGH (ref 70–99)
Glucose-Capillary: 143 mg/dL — ABNORMAL HIGH (ref 70–99)
Glucose-Capillary: 145 mg/dL — ABNORMAL HIGH (ref 70–99)
Glucose-Capillary: 92 mg/dL (ref 70–99)

## 2010-12-19 LAB — CARDIAC PANEL(CRET KIN+CKTOT+MB+TROPI)
CK, MB: 1.5 ng/mL (ref 0.3–4.0)
CK, MB: 1.6 ng/mL (ref 0.3–4.0)
Relative Index: 1.1 (ref 0.0–2.5)
Relative Index: 1.4 (ref 0.0–2.5)
Relative Index: 1.5 (ref 0.0–2.5)
Total CK: 104 U/L (ref 7–232)
Total CK: 110 U/L (ref 7–232)
Troponin I: 0.01 ng/mL (ref 0.00–0.06)
Troponin I: 0.02 ng/mL (ref 0.00–0.06)

## 2010-12-19 LAB — BASIC METABOLIC PANEL
BUN: 9 mg/dL (ref 6–23)
CO2: 27 mEq/L (ref 19–32)
Chloride: 105 mEq/L (ref 96–112)
Chloride: 107 mEq/L (ref 96–112)
Creatinine, Ser: 0.82 mg/dL (ref 0.4–1.5)
Creatinine, Ser: 0.84 mg/dL (ref 0.4–1.5)
GFR calc Af Amer: >60 mL/min (ref >60)
GFR calc Af Amer: >60 mL/min (ref >60)
GFR calc non Af Amer: >60 mL/min (ref >60)
Potassium: 3.4 mEq/L — ABNORMAL LOW (ref 3.5–5.1)
Potassium: 3.7 mEq/L (ref 3.5–5.1)

## 2010-12-19 LAB — URINALYSIS, ROUTINE W REFLEX MICROSCOPIC
Bilirubin Urine: NEGATIVE
Glucose, UA: NEGATIVE mg/dL
Hgb urine dipstick: NEGATIVE
Protein, ur: NEGATIVE mg/dL
Urobilinogen, UA: 1 mg/dL (ref 0.0–1.0)

## 2010-12-19 LAB — CBC
HCT: 39.9 % (ref 39.0–52.0)
HCT: 40.5 % (ref 39.0–52.0)
MCV: 92.7 fL (ref 78.0–100.0)
MCV: 93.4 fL (ref 78.0–100.0)
Platelets: 160 10*3/uL (ref 150–400)
Platelets: 161 10*3/uL (ref 150–400)
Platelets: 161 10*3/uL (ref 150–400)
RBC: 4.21 MIL/uL — ABNORMAL LOW (ref 4.22–5.81)
RBC: 4.27 MIL/uL (ref 4.22–5.81)
RBC: 4.37 MIL/uL (ref 4.22–5.81)
RDW: 12.8 % (ref 11.5–15.5)
RDW: 12.8 % (ref 11.5–15.5)
WBC: 6.1 10*3/uL (ref 4.0–10.5)
WBC: 7.7 10*3/uL (ref 4.0–10.5)
WBC: 7.8 10*3/uL (ref 4.0–10.5)

## 2010-12-19 LAB — POCT CARDIAC MARKERS
CKMB, poc: <1 ng/mL — ABNORMAL LOW (ref 1.0–8.0)
Myoglobin, poc: 50.1 ng/mL (ref 12–200)
Myoglobin, poc: 77.4 ng/mL (ref 12–200)
Troponin i, poc: <0.05 ng/mL (ref 0.00–0.09)

## 2010-12-19 LAB — PROTIME-INR
INR: 1.13 (ref 0.00–1.49)
Prothrombin Time: 14.7 seconds (ref 11.6–15.2)

## 2010-12-19 LAB — LIPID PANEL
Cholesterol: 148 mg/dL (ref 0–200)
HDL: 27 mg/dL — ABNORMAL LOW (ref >39)
LDL Cholesterol: 83 mg/dL (ref 0–99)
Triglycerides: 192 mg/dL — ABNORMAL HIGH (ref <150)

## 2010-12-19 LAB — MAGNESIUM: Magnesium: 1.8 mg/dL (ref 1.5–2.5)

## 2010-12-19 LAB — APTT: aPTT: 28 seconds (ref 24–37)

## 2011-01-16 ENCOUNTER — Encounter: Payer: Self-pay | Admitting: Internal Medicine

## 2011-01-16 ENCOUNTER — Ambulatory Visit (INDEPENDENT_AMBULATORY_CARE_PROVIDER_SITE_OTHER): Payer: 59 | Admitting: Internal Medicine

## 2011-01-16 VITALS — BP 110/64 | HR 66 | Temp 97.7°F | Ht 71.0 in | Wt 213.0 lb

## 2011-01-16 DIAGNOSIS — K5900 Constipation, unspecified: Secondary | ICD-10-CM

## 2011-01-16 DIAGNOSIS — M1990 Unspecified osteoarthritis, unspecified site: Secondary | ICD-10-CM

## 2011-01-16 DIAGNOSIS — I10 Essential (primary) hypertension: Secondary | ICD-10-CM

## 2011-01-16 MED ORDER — TRAMADOL HCL 50 MG PO TABS: 50.0000 mg | ORAL_TABLET | Freq: Four times a day (QID) | ORAL | Status: DC | PRN

## 2011-01-16 NOTE — Progress Notes (Signed)
Subjective:    Patient ID: Miguel Swanson, male    DOB: 08-26-1926, 77 y.o.   MRN: [id number]  HPI He returns c/o arthritis pain in his hands and hips for 40 years, he gets some relief with ibuprofen but he wants to try something else for the more severe pain. The joints do not get swollen. He does not have muscle aches or weakness.    Review of Systems  Constitutional: Negative for fever, chills, diaphoresis, activity change, appetite change, fatigue and unexpected weight change.  HENT: Negative for facial swelling, neck pain and neck stiffness.   Respiratory: Negative for apnea, cough, choking, chest tightness, shortness of breath, wheezing and stridor.   Cardiovascular: Negative for chest pain, palpitations and leg swelling.  Gastrointestinal: Negative for nausea, vomiting, abdominal pain, diarrhea, constipation, blood in stool and abdominal distention.  Genitourinary: Negative for dysuria, urgency, frequency, hematuria, decreased urine volume and difficulty urinating.  Musculoskeletal: Positive for arthralgias. Negative for myalgias, back pain, joint swelling and gait problem.  Skin: Negative for color change, pallor and rash.  Neurological: Negative for dizziness, tremors, seizures, syncope, facial asymmetry, speech difficulty, weakness, light-headedness, numbness and headaches.  Hematological: Negative for adenopathy. Does not bruise/bleed easily.  Psychiatric/Behavioral: Negative for hallucinations, behavioral problems, confusion, sleep disturbance, self-injury, dysphoric mood, decreased concentration and agitation. The patient is not nervous/anxious and is not hyperactive.    Lab Results  Component Value Date   WBC 8.2 11/17/2010   HGB 15.3 11/17/2010   HCT 44.1 11/17/2010   PLT 192.0 11/17/2010   CHOL 148 11/17/2010   TRIG 135.0 11/17/2010   HDL 33.90* 11/17/2010   ALT 20 11/17/2010   AST 18 11/17/2010   NA 139 11/17/2010   K 3.7 11/17/2010   CL 96 11/17/2010   CREATININE 0.8  11/17/2010   BUN 12 11/17/2010   CO2 28 11/17/2010   TSH 2.70 11/17/2010   INR 1.13 05/06/2010   HGBA1C  Value: 5.7 (NOTE)                                                                       According to the ADA Clinical Practice Recommendations for 2011, when HbA1c is used as a screening test:   >=6.5%   Diagnostic of Diabetes Mellitus           (if abnormal result  is confirmed)  5.7-6.4%   Increased risk of developing Diabetes Mellitus  References:Diagnosis and Classification of Diabetes Mellitus,Diabetes Care,2011,34(Suppl 1):S62-S69 and Standards of Medical Care in         Diabetes - 2011,Diabetes Care,2011,34  (Suppl 1):S11-S61.* 05/06/2010      Objective:   Physical Exam  Constitutional: He is oriented to person, place, and time. He appears well-developed and well-nourished. No distress.  HENT:  Head: Normocephalic and atraumatic.  Right Ear: External ear normal.  Left Ear: External ear normal.  Mouth/Throat: No oropharyngeal exudate.  Eyes: Conjunctivae and EOM are normal. Pupils are equal, round, and reactive to light. Right eye exhibits no discharge. Left eye exhibits no discharge. No scleral icterus.  Neck: Normal range of motion. Neck supple. No JVD present. No thyromegaly present.  Cardiovascular: Normal rate, regular rhythm, normal heart sounds and intact distal pulses.  Exam reveals no gallop  and no friction rub.   No murmur heard. Pulmonary/Chest: Effort normal and breath sounds normal. No respiratory distress. He has no wheezes. He has no rales. He exhibits no tenderness.  Abdominal: Soft. Bowel sounds are normal. He exhibits no distension. There is no tenderness. There is no rebound and no guarding.  Musculoskeletal: Normal range of motion. He exhibits no edema and no tenderness.       Right hand: He exhibits normal range of motion, no tenderness, no bony tenderness and no deformity. normal sensation noted. Normal strength noted.       Left hand: He exhibits normal range of  motion, no tenderness, no bony tenderness and no deformity. normal sensation noted. Normal strength noted.  Lymphadenopathy:    He has no cervical adenopathy.  Neurological: He is alert and oriented to person, place, and time. He has normal reflexes. He displays normal reflexes. No cranial nerve deficit. He exhibits normal muscle tone. Coordination normal.  Skin: Skin is warm and dry. No rash noted. He is not diaphoretic. No erythema. No pallor.  Psychiatric: He has a normal mood and affect. His behavior is normal. Judgment and thought content normal.          Assessment & Plan:

## 2011-01-16 NOTE — Assessment & Plan Note (Signed)
He can continue ibuprofen prn and will add tramadol as well, he was given pt ed material about OA

## 2011-01-16 NOTE — Assessment & Plan Note (Signed)
This has improved.

## 2011-01-16 NOTE — Patient Instructions (Signed)
Arthritis - Degenerative, Osteoarthritis You have osteoarthritis. This is the wear and tear arthritis that comes with aging. It is also called degenerative arthritis. This is common in people past middle age. It is caused by stress on the joints from living. The large weight bearing joints of the lower extremities are most often affected. The knees, hips, back, neck, and hands can become painful, swollen, and stiff. This is the most common type of arthritis. It comes on with age, carrying too much weight, and from injury. Treatment includes resting the sore joint until the pain and swelling improve. Crutches or a walker may be needed for severe flares. Only take over-the-counter or prescription medicines for pain, discomfort, or fever as directed by your caregiver. Local heat therapy may improve motion. Cortisone shots into the joint are sometimes used to reduce pain and swelling during flares. Osteoarthritis is usually not crippling and progresses slowly. There are things you can do to decrease pain:  Avoid high impact activities.   Exercise regularly.   Low impact exercises such as walking, biking and swimming help to keep the muscles strong and keep normal joint function.   Stretching helps to keep your range of motion.   Lose weight if you are overweight. This reduces joint stress.  In severe cases when you have pain at rest or increasing disability, joint surgery may be helpful. See your caregiver for follow-up treatment as recommended.  SEEK IMMEDIATE MEDICAL CARE IF:  You have severe joint pain.   Marked swelling and redness in your joint develops.   You develop a high fever.  Document Released: 09/21/2005 Document Re-Released: 01/21/2008 [employer] Patient Information 2011 [employer]. 

## 2011-01-16 NOTE — Assessment & Plan Note (Signed)
His BP is well controlled 

## 2011-02-20 NOTE — Discharge Summary (Signed)
NAMELOI, RENNAKER NO.:  [id number]   MEDICAL RECORD NO.:  [id number]          PATIENT TYPE:  OIB   LOCATION:  1409                         FACILITY:  [facility]   PHYSICIAN:  Heloise Purpura, MD      DATE OF BIRTH:  01-12-1926   DATE OF ADMISSION:  10/09/2005  DATE OF DISCHARGE:  10/10/2005                                 DISCHARGE SUMMARY   ADMISSION DIAGNOSIS:  Bladder tumor.   DISCHARGE DIAGNOSIS:  Bladder tumor.   PROCEDURES:  1.  Cystoscopy.  2.  Transurethral resection of bladder tumor.  3.  Bilateral retrograde pyelograms  4.  Examination under anesthesia.  5.  Right ureteral stent placement.   HISTORY:  For full details please see admission history and physical.  Briefly, Mr. Fiorella who is a gentleman who was recently found to have a  bladder tumor noted on cystoscopy. After discussing management options, it  was decided to proceed with transurethral resection.   HOSPITAL COURSE:  The patient was admitted to the hospital after undergoing  an uneventful transurethral resection of his bladder tumor. He did receive  postoperative mitomycin C in the recovery room. He was then transferred to a  regular hospital room for postoperative care. On postoperative day one, his  urine had cleared; and he was otherwise doing well. He was tolerating a  regular diet and able to be discharged home in good condition.   DISPOSITION:  Home.   DISCHARGE MEDICATIONS:  The patient was instructed to resume his regular  medications except for any aspirin or nonsteroidal anti-inflammatory drugs  for a period of 2 weeks. He was given a prescription for Vicodin to take as  needed for pain; and told to take Colace as a stool softener. He was also  given a prescription for Cipro to begin 1 day prior to his return visit.   FOLLOWUP:  An appointment will be made for Mr. Normington to followup in 1 week  for Foley catheter removal and to review his pathology.     ______________________________  Heloise Purpura, MD  Electronically Signed     LB/MEDQ  D:  10/12/2005  T:  10/13/2005  Job:  [id number]

## 2011-02-20 NOTE — H&P (Signed)
NAMEABRIAN, Miguel Swanson NO.:  [id number]   MEDICAL RECORD NO.:  [id number]          PATIENT TYPE:  OIB   LOCATION:  1409                         FACILITY:  [facility]   PHYSICIAN:  Heloise Purpura, MD      DATE OF BIRTH:  [birth year]   DATE OF ADMISSION:  10/09/2005  DATE OF DISCHARGE:                                HISTORY & PHYSICAL   CHIEF COMPLAINT:  Bladder tumor.   HISTORY OF PRESENT ILLNESS:  Miguel Swanson is a 77 year old gentleman who was  evaluated for urethral bleeding. He underwent a cystoscopy by Miguel Swanson and was found to have a bladder tumor. He was subsequently seen  in consultation for further management of his bladder tumor.  After  discussing options, the patient elected to proceed with transurethral  resection of his bladder tumor for diagnostic and staging purposes.   PAST MEDICAL HISTORY:  1.  Coronary artery disease.  2.  Gastroesophageal reflux disease.  3.  Peripheral vascular disease.  4.  Hypertension.   PAST SURGICAL HISTORY:  1.  Coronary artery bypass grafting.  2.  Abdominal aortic aneurysm repair.  3.  Bilateral inguinal hernia repair.   MEDICATIONS:  1.  Lipitor.  2.  Klonopin.  3.  Pepcid.  4.  Lopid.  5.  Hydrochlorothiazide.  6.  Lopressor.  7.  Protonix.  8.  Xanax.   ALLERGIES:  No known drug allergies.   FAMILY HISTORY:  Pertinent for hypertension, kidney stones, kidney failure  and prostate cancer.   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  He  does have a history of tobacco but quit in 1977.   PHYSICAL EXAMINATION:  GENERAL APPEARANCE:  Alert and oriented in no acute  distress.  HEENT:  Normocephalic, atraumatic.  Oropharynx is clear.  NECK:  No jugular venous distention or carotid bruits.  LUNGS:  Clear bilaterally.  CARDIOVASCULAR:  Regular rate and rhythm.  ABDOMEN:  Soft, nontender, nondistended without abdominal masses.  BACK:  No costovertebral angle tenderness.  GENITOURINARY:  The patient has a normal uncircumcised male phallus without  urethral discharge.  Testes are descended bilaterally and are nontender  without masses.  DIGITAL RECTAL:  The patient has normal sphincter tone and no rectal masses.  Prostate is 40 grams and without nodularity or induration.  EXTREMITIES:  No edema.  NEUROLOGICAL:  Grossly intact.   IMPRESSION:  Bladder tumor.   PLAN:  Miguel Swanson will undergo a cystoscopy with transurethral resection of  his bladder tumor, bilateral retrograde pyelograms, saline bladder wash and  any other necessary procedures.  He will then be admitted to the hospital  overnight with plans to be discharged home the next day.           ______________________________  Heloise Purpura, MD  Electronically Signed     LB/MEDQ  D:  10/10/2005  T:  10/10/2005  Job:  [id number]

## 2011-02-20 NOTE — Op Note (Signed)
NAMEJAGDEEP, ANCHETA NO.:  [id number]   MEDICAL RECORD NO.:  [id number]          PATIENT TYPE:  OIB   LOCATION:  1409                         [facility]ACILITY[facility]   PHYSICIAN:  Heloise Purpura, MD      DATE OF BIRTH:  03-15-1926   DATE OF PROCEDURE:  10/09/2005  DATE OF DISCHARGE:                                 OPERATIVE REPORT   PREOPERATIVE DIAGNOSIS:  Bladder tumor.   POSTOPERATIVE DIAGNOSIS:  Bladder tumor.   PROCEDURE:  1.  Cystoscopy.  2.  Transurethral resection of large bladder tumor greater than 5 cm.  3.  Bilateral retrograde pyelography.  4.  Saline bladder wash.  5.  Examination under anesthesia.  6.  Right ureteral stent placement (6 x 26 double-J)  7.  Instillation of intravesical Mitomycin C   SURGEON:  Dr. Heloise Purpura.   ANESTHESIA:  General.   COMPLICATIONS:  None.   INDICATIONS:  Mr. Miguel Swanson is a 77 year old gentleman with recent urethral  bleeding. He was evaluated and cystoscopy was performed as part of his  hematuria evaluation. This demonstrated a bladder tumor. After discussing  management options, the patient elected to proceed with the above  procedures. Potential risks and benefits of these procedures were discussed  with the patient and he consented.   DESCRIPTION OF PROCEDURE:  The patient was taken to the operating room and a  general anesthetic was administered. He was given preoperative antibiotics,  placed in the dorsal lithotomy position, and prepped and draped in the usual  sterile fashion. Next a preoperative time-out was performed and  cystourethroscopy was then performed. Both the 12 and 70 degree lens were  used to examine first the urethra and then the entire bladder  systematically. This revealed a normal anterior urethra. The posterior  urethra demonstrated bilateral lateral lobe enlargement of the prostate. On  examination of the bladder, the left ureteral orifice was in the normal  anatomic position and  effluxing clear urine. A large sessile bladder tumor  was noted from the medial aspect of the right trigone across the trigone of  the bladder and lateral to the right ureteral orifice. In fact, the right  ureteral orifice was not readily identifiable. The remainder of the bladder  was free of any tumor, stones, or other mucosal pathology. A saline bladder  wash was then obtained. A left retrograde pyelogram was performed with a 6-  French ureteral catheter. This demonstrated no evidence of ureteral or renal  pelvic filling defects. Indigo carmine was then administered intravenously.  This allowed identification of the location of the right ureteral orifice.  The cystoscope was removed and replaced with the resectoscope. Loop  electrocautery resection was then performed of the bladder tumor down to the  level of the muscularis propria. In the area of the right ureteral orifice,  care was taken to only use cutting current. Once the entire tumor was  resected and the specimen removed from the bladder for permanent pathologic  analysis, a 0.038 Glidewire was inserted into the right ureteral orifice. A  6-French stent was then placed over it and contrast  was injected. This  demonstrated no evidence of fixed filling defects in the ureter or renal  pelvis. However, there was evidence of air bubbles in the proximal ureter.  However, on real time imaging these did not appear to be fixed defects. The  Glidewire was inserted up into the renal pelvis and a 6 x 26 double-J  ureteral stent was placed over the wire and appropriately positioned under  fluoroscopic and cystoscopic guidance. The wire was then removed with a good  curl noted in the renal pelvis as well as in the bladder. Cautery was then  used to achieve hemostasis. The resectoscope was then removed and the  bladder was emptied.  An exam under anesthesia was performed and there was  no evidence of any 3-dimensional mass.  A 22-French  three-way Foley catheter  was inserted into the bladder. 40 mg of mitomycin was then instilled into  the bladder and the Foley catheter was capped. The patient appeared to  tolerate the procedure well and without complications. He was able to be  transferred to the recovery unit in satisfactory condition.           ______________________________  Heloise Purpura, MD  Electronically Signed     LB/MEDQ  D:  10/09/2005  T:  10/09/2005  Job:  [id number]   cc:   Martina Sinner, MD  Fax: [phone]

## 2011-05-19 ENCOUNTER — Ambulatory Visit (INDEPENDENT_AMBULATORY_CARE_PROVIDER_SITE_OTHER): Payer: 59 | Admitting: Internal Medicine

## 2011-05-19 ENCOUNTER — Encounter: Payer: Self-pay | Admitting: Internal Medicine

## 2011-05-19 DIAGNOSIS — M1990 Unspecified osteoarthritis, unspecified site: Secondary | ICD-10-CM

## 2011-05-19 DIAGNOSIS — E785 Hyperlipidemia, unspecified: Secondary | ICD-10-CM

## 2011-05-19 DIAGNOSIS — I10 Essential (primary) hypertension: Secondary | ICD-10-CM

## 2011-05-19 MED ORDER — CELECOXIB 200 MG PO CAPS: 200.0000 mg | ORAL_CAPSULE | Freq: Every day | ORAL | Status: DC

## 2011-05-19 NOTE — Assessment & Plan Note (Signed)
Try celebrex

## 2011-05-19 NOTE — Patient Instructions (Signed)
Arthritis - Degenerative, Osteoarthritis You have osteoarthritis. This is the wear and tear arthritis that comes with aging. It is also called degenerative arthritis. This is common in people past middle age. It is caused by stress on the joints from living. The large weight bearing joints of the lower extremities are most often affected. The knees, hips, back, neck, and hands can become painful, swollen, and stiff. This is the most common type of arthritis. It comes on with age, carrying too much weight, and from injury. Treatment includes resting the sore joint until the pain and swelling improve. Crutches or a walker may be needed for severe flares. Only take over-the-counter or prescription medicines for pain, discomfort, or fever as directed by your caregiver. Local heat therapy may improve motion. Cortisone shots into the joint are sometimes used to reduce pain and swelling during flares. Osteoarthritis is usually not crippling and progresses slowly. There are things you can do to decrease pain:  Avoid high impact activities.   Exercise regularly.   Low impact exercises such as walking, biking and swimming help to keep the muscles strong and keep normal joint function.   Stretching helps to keep your range of motion.   Lose weight if you are overweight. This reduces joint stress.  In severe cases when you have pain at rest or increasing disability, joint surgery may be helpful. See your caregiver for follow-up treatment as recommended.  SEEK IMMEDIATE MEDICAL CARE IF:  You have severe joint pain.   Marked swelling and redness in your joint develops.   You develop a high fever.  Document Released: 09/21/2005 Document Re-Released: 01/21/2008 [employer] Patient Information 2011 [employer]. 

## 2011-05-19 NOTE — Assessment & Plan Note (Signed)
BP is well controlled 

## 2011-05-19 NOTE — Assessment & Plan Note (Signed)
Continue crestor 

## 2011-05-19 NOTE — Progress Notes (Signed)
Subjective:    Patient ID: Miguel Swanson, male    DOB: [birth year], 77 y.o.   MRN: [id number]  Knee Pain  The incident occurred more than 1 week ago. There was no injury mechanism. The pain is present in the right knee and left knee. The quality of the pain is described as aching. The pain is at a severity of 1/10. The pain is mild. The pain has been intermittent since onset. Pertinent negatives include no inability to bear weight, loss of motion, loss of sensation, muscle weakness, numbness or tingling. He reports no foreign bodies present. The symptoms are aggravated by movement. He has tried nothing for the symptoms.      Review of Systems  Constitutional: Negative for fever, chills, diaphoresis, activity change, appetite change, fatigue and unexpected weight change.  HENT: Negative for sore throat, facial swelling, neck pain, neck stiffness and voice change.   Eyes: Negative for photophobia, redness and visual disturbance.  Respiratory: Negative for apnea, cough, choking, chest tightness, shortness of breath, wheezing and stridor.   Cardiovascular: Negative for chest pain, palpitations and leg swelling.  Gastrointestinal: Negative for nausea, vomiting, abdominal pain, diarrhea, constipation and abdominal distention.  Genitourinary: Negative.   Musculoskeletal: Positive for back pain (chronic, unchanged low back pain) and arthralgias (shoulders and knees). Negative for myalgias, joint swelling and gait problem.  Neurological: Negative.  Negative for tingling and numbness.  Hematological: Negative.   Psychiatric/Behavioral: Negative.        Objective:   Physical Exam  Vitals reviewed. Constitutional: He is oriented to person, place, and time. He appears well-developed and well-nourished. No distress.  HENT:  Head: Normocephalic and atraumatic.  Right Ear: External ear normal.  Left Ear: External ear normal.  Nose: Nose normal.  Mouth/Throat: No oropharyngeal exudate.  Eyes:  Conjunctivae and EOM are normal. Pupils are equal, round, and reactive to light. Right eye exhibits no discharge. Left eye exhibits no discharge. No scleral icterus.  Neck: Normal range of motion. Neck supple. No JVD present. No tracheal deviation present. No thyromegaly present.  Cardiovascular: Normal rate, regular rhythm, normal heart sounds and intact distal pulses.  Exam reveals no gallop and no friction rub.   No murmur heard. Pulmonary/Chest: Effort normal and breath sounds normal. No stridor. No respiratory distress. He has no wheezes. He has no rales. He exhibits no tenderness.  Abdominal: Soft. Bowel sounds are normal. He exhibits no distension and no mass. There is no tenderness. There is no rebound and no guarding.  Musculoskeletal: Normal range of motion. He exhibits no edema and no tenderness.  Lymphadenopathy:    He has no cervical adenopathy.  Neurological: He is alert and oriented to person, place, and time. He has normal reflexes.  Skin: Skin is warm and dry. No rash noted. He is not diaphoretic. No erythema. No pallor.  Psychiatric: He has a normal mood and affect. His behavior is normal. Judgment and thought content normal.     Lab Results  Component Value Date   WBC 8.2 11/17/2010   HGB 15.3 11/17/2010   HCT 44.1 11/17/2010   PLT 192.0 11/17/2010   CHOL 148 11/17/2010   TRIG 135.0 11/17/2010   HDL 33.90* 11/17/2010   ALT 20 11/17/2010   AST 18 11/17/2010   NA 139 11/17/2010   K 3.7 11/17/2010   CL 96 11/17/2010   CREATININE 0.8 11/17/2010   BUN 12 11/17/2010   CO2 28 11/17/2010   TSH 2.70 11/17/2010   INR 1.13 05/06/2010  HGBA1C  Value: 5.7 (NOTE)                                                                       According to the ADA Clinical Practice Recommendations for 2011, when HbA1c is used as a screening test:   >=6.5%   Diagnostic of Diabetes Mellitus           (if abnormal result  is confirmed)  5.7-6.4%   Increased risk of developing Diabetes Mellitus   References:Diagnosis and Classification of Diabetes Mellitus,Diabetes Care,2011,34(Suppl 1):S62-S69 and Standards of Medical Care in         Diabetes - 2011,Diabetes Care,2011,34  (Suppl 1):S11-S61.* 05/06/2010       Assessment & Plan:

## 2011-07-21 ENCOUNTER — Other Ambulatory Visit (INDEPENDENT_AMBULATORY_CARE_PROVIDER_SITE_OTHER): Payer: Medicare Other

## 2011-07-21 ENCOUNTER — Encounter: Payer: Self-pay | Admitting: Internal Medicine

## 2011-07-21 ENCOUNTER — Ambulatory Visit (INDEPENDENT_AMBULATORY_CARE_PROVIDER_SITE_OTHER): Payer: Medicare Other | Admitting: Internal Medicine

## 2011-07-21 DIAGNOSIS — I714 Abdominal aortic aneurysm, without rupture: Secondary | ICD-10-CM

## 2011-07-21 DIAGNOSIS — M1990 Unspecified osteoarthritis, unspecified site: Secondary | ICD-10-CM

## 2011-07-21 DIAGNOSIS — R739 Hyperglycemia, unspecified: Secondary | ICD-10-CM | POA: Insufficient documentation

## 2011-07-21 DIAGNOSIS — R7309 Other abnormal glucose: Secondary | ICD-10-CM

## 2011-07-21 DIAGNOSIS — F0390 Unspecified dementia without behavioral disturbance: Secondary | ICD-10-CM | POA: Insufficient documentation

## 2011-07-21 DIAGNOSIS — R079 Chest pain, unspecified: Secondary | ICD-10-CM | POA: Insufficient documentation

## 2011-07-21 DIAGNOSIS — I10 Essential (primary) hypertension: Secondary | ICD-10-CM

## 2011-07-21 DIAGNOSIS — R609 Edema, unspecified: Secondary | ICD-10-CM

## 2011-07-21 DIAGNOSIS — R0609 Other forms of dyspnea: Secondary | ICD-10-CM

## 2011-07-21 DIAGNOSIS — E785 Hyperlipidemia, unspecified: Secondary | ICD-10-CM

## 2011-07-21 DIAGNOSIS — F068 Other specified mental disorders due to known physiological condition: Secondary | ICD-10-CM

## 2011-07-21 DIAGNOSIS — I2510 Atherosclerotic heart disease of native coronary artery without angina pectoris: Secondary | ICD-10-CM

## 2011-07-21 LAB — CBC WITH DIFFERENTIAL/PLATELET
Basophils Absolute: 0.1 10*3/uL (ref 0.0–0.1)
Eosinophils Absolute: 0.7 10*3/uL (ref 0.0–0.7)
Lymphocytes Relative: 36.3 % (ref 12.0–46.0)
MCHC: 33.5 g/dL (ref 30.0–36.0)
Neutrophils Relative %: 46.6 % (ref 43.0–77.0)
Platelets: 160 10*3/uL (ref 150.0–400.0)
RBC: 4.3 Mil/uL (ref 4.22–5.81)
RDW: 14.1 % (ref 11.5–14.6)

## 2011-07-21 LAB — COMPREHENSIVE METABOLIC PANEL
Albumin: 4.3 g/dL (ref 3.5–5.2)
CO2: 28 mEq/L (ref 19–32)
Calcium: 9.1 mg/dL (ref 8.4–10.5)
GFR: 85.55 mL/min (ref >60.00)
Glucose, Bld: 101 mg/dL — ABNORMAL HIGH (ref 70–99)
Potassium: 3.7 mEq/L (ref 3.5–5.1)
Sodium: 139 mEq/L (ref 135–145)
Total Protein: 7.3 g/dL (ref 6.0–8.3)

## 2011-07-21 LAB — TSH: TSH: 3.28 u[IU]/mL (ref 0.35–5.50)

## 2011-07-21 LAB — LIPID PANEL
LDL Cholesterol: 77 mg/dL (ref 0–99)
Total CHOL/HDL Ratio: 4

## 2011-07-21 NOTE — Assessment & Plan Note (Signed)
He DID NOT report any chest pain today, this diagnosis was entered by the CMA for coding the EKG

## 2011-07-21 NOTE — Assessment & Plan Note (Signed)
He is due for a f/up US of this

## 2011-07-21 NOTE — Assessment & Plan Note (Signed)
This is age related and I do not think meds will help him

## 2011-07-21 NOTE — Assessment & Plan Note (Signed)
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

## 2011-07-21 NOTE — Patient Instructions (Signed)

## 2011-07-21 NOTE — Progress Notes (Signed)
Subjective:    Patient ID: Miguel Swanson, male    DOB: August 27, 1926, 77 y.o.   MRN: [id number]  Hypertension This is a chronic problem. The current episode started more than 1 year ago. The problem is unchanged. The problem is controlled. Associated symptoms include peripheral edema. Pertinent negatives include no anxiety, blurred vision, chest pain, headaches, malaise/fatigue, neck pain, orthopnea, palpitations, PND, shortness of breath or sweats. There are no associated agents to hypertension. Past treatments include diuretics. The current treatment provides moderate improvement. Compliance problems include exercise and diet.  Hypertensive end-organ damage includes CAD/MI.      Review of Systems  Constitutional: Negative for fever, chills, malaise/fatigue, diaphoresis, activity change, appetite change, fatigue and unexpected weight change.  HENT: Negative for sore throat, facial swelling, trouble swallowing, neck pain and voice change.   Eyes: Negative.  Negative for blurred vision.  Respiratory: Negative for apnea, cough, choking, chest tightness, shortness of breath, wheezing and stridor.   Cardiovascular: Negative for chest pain, palpitations, orthopnea, leg swelling and PND.  Gastrointestinal: Negative for nausea, vomiting, abdominal pain, diarrhea, constipation, blood in stool, abdominal distention and anal bleeding.  Genitourinary: Negative for dysuria, urgency, frequency, hematuria, flank pain, decreased urine volume, enuresis and difficulty urinating.  Musculoskeletal: Positive for arthralgias (hand joints are painful). Negative for myalgias, back pain, joint swelling and gait problem.  Skin: Negative for color change, pallor, rash and wound.  Neurological: Negative for dizziness, tremors, seizures, syncope, facial asymmetry, speech difficulty, weakness, light-headedness, numbness and headaches.  Hematological: Negative for adenopathy. Does not bruise/bleed easily.    Psychiatric/Behavioral: Positive for behavioral problems (forgetful) and decreased concentration. Negative for suicidal ideas, hallucinations, confusion, sleep disturbance, self-injury, dysphoric mood and agitation. The patient is not nervous/anxious and is not hyperactive.        Objective:   Physical Exam  Vitals reviewed. Constitutional: He is oriented to person, place, and time. He appears well-developed. No distress.  HENT:  Head: Normocephalic and atraumatic.  Mouth/Throat: Oropharynx is clear and moist. No oropharyngeal exudate.  Eyes: Conjunctivae are normal. Right eye exhibits no discharge. Left eye exhibits no discharge. No scleral icterus.  Neck: Normal range of motion. Neck supple. No JVD present. No tracheal deviation present. No thyromegaly present.  Cardiovascular: Normal rate, regular rhythm and intact distal pulses.  Exam reveals no gallop and no friction rub.   Murmur heard. Pulmonary/Chest: Effort normal and breath sounds normal. No stridor. No respiratory distress. He has no wheezes. He has no rales. He exhibits no tenderness.  Abdominal: Soft. Bowel sounds are normal. He exhibits no distension and no mass. There is no tenderness. There is no rebound and no guarding.  Musculoskeletal: Normal range of motion. He exhibits edema (trace edema in both legs). He exhibits no tenderness.       Right hand: Normal. He exhibits no tenderness, no bony tenderness and no deformity.       Left hand: Normal. He exhibits no tenderness, no bony tenderness and no deformity.  Lymphadenopathy:    He has no cervical adenopathy.  Neurological: He is oriented to person, place, and time. He displays normal reflexes. He exhibits normal muscle tone. Coordination normal.  Skin: Skin is warm and dry. No rash noted. He is not diaphoretic. No erythema. No pallor.  Psychiatric: He has a normal mood and affect. Thought content normal. His mood appears not anxious. His affect is not angry, not blunt, not  labile and not inappropriate. His speech is delayed and tangential. His speech is not  rapid and/or pressured and not slurred. He is slowed. He is not agitated, not aggressive, is not hyperactive, not withdrawn, not actively hallucinating and not combative. Thought content is not paranoid and not delusional. Cognition and memory are impaired. He does not express impulsivity or inappropriate judgment. He does not exhibit a depressed mood. He expresses no homicidal and no suicidal ideation. He expresses no suicidal plans and no homicidal plans. He is communicative. He exhibits abnormal recent memory and abnormal remote memory. He is inattentive.      Lab Results  Component Value Date   WBC 8.2 11/17/2010   HGB 15.3 11/17/2010   HCT 44.1 11/17/2010   PLT 192.0 11/17/2010   GLUCOSE 108* 11/17/2010   CHOL 148 11/17/2010   TRIG 135.0 11/17/2010   HDL 33.90* 11/17/2010   LDLCALC 87 11/17/2010   ALT 20 11/17/2010   AST 18 11/17/2010   NA 139 11/17/2010   K 3.7 11/17/2010   CL 96 11/17/2010   CREATININE 0.8 11/17/2010   BUN 12 11/17/2010   CO2 28 11/17/2010   TSH 2.70 11/17/2010   INR 1.13 05/06/2010   HGBA1C  Value: 5.7 (NOTE)                                                                       According to the ADA Clinical Practice Recommendations for 2011, when HbA1c is used as a screening test:   >=6.5%   Diagnostic of Diabetes Mellitus           (if abnormal result  is confirmed)  5.7-6.4%   Increased risk of developing Diabetes Mellitus  References:Diagnosis and Classification of Diabetes Mellitus,Diabetes Care,2011,34(Suppl 1):S62-S69 and Standards of Medical Care in         Diabetes - 2011,Diabetes Care,2011,34  (Suppl 1):S11-S61.* 05/06/2010      Assessment & Plan:

## 2011-07-21 NOTE — Assessment & Plan Note (Signed)
i will check his labs to look for chf, renal insufficiency, abnormal lytes, etc

## 2011-07-22 ENCOUNTER — Encounter: Payer: Self-pay | Admitting: Internal Medicine

## 2011-07-22 NOTE — Assessment & Plan Note (Signed)
He needs to have an A1C done

## 2011-07-22 NOTE — Assessment & Plan Note (Signed)
Continue OTC meds

## 2011-07-22 NOTE — Assessment & Plan Note (Signed)
I will check his FLP today 

## 2011-07-22 NOTE — Assessment & Plan Note (Signed)
His BP is well controlled, I will check his lytes and renal function 

## 2011-07-24 ENCOUNTER — Ambulatory Visit
Admission: RE | Admit: 2011-07-24 | Discharge: 2011-07-24 | Disposition: A | Discharge status: Home / Self Care | Payer: Medicare Other | Source: Ambulatory Visit | Attending: Internal Medicine | Admitting: Internal Medicine

## 2011-07-24 DIAGNOSIS — I714 Abdominal aortic aneurysm, without rupture: Secondary | ICD-10-CM

## 2011-08-14 ENCOUNTER — Encounter: Payer: Self-pay | Admitting: Cardiovascular Disease

## 2011-08-14 ENCOUNTER — Ambulatory Visit (INDEPENDENT_AMBULATORY_CARE_PROVIDER_SITE_OTHER): Payer: Medicare Other | Admitting: Cardiovascular Disease

## 2011-08-14 DIAGNOSIS — I2510 Atherosclerotic heart disease of native coronary artery without angina pectoris: Secondary | ICD-10-CM

## 2011-08-14 DIAGNOSIS — I714 Abdominal aortic aneurysm, without rupture: Secondary | ICD-10-CM

## 2011-08-14 DIAGNOSIS — I10 Essential (primary) hypertension: Secondary | ICD-10-CM

## 2011-08-14 DIAGNOSIS — E785 Hyperlipidemia, unspecified: Secondary | ICD-10-CM

## 2011-08-14 DIAGNOSIS — R9431 Abnormal electrocardiogram [ECG] [EKG]: Secondary | ICD-10-CM | POA: Insufficient documentation

## 2011-08-14 NOTE — Assessment & Plan Note (Signed)
Commented on by Dr Yetta Barre but no change from 8/11.  Appears to have SSS and agree with stopping beta blockers.  F/U ECG in 6 months

## 2011-08-14 NOTE — Assessment & Plan Note (Signed)
No details of previous surgery.  Needs F/U with vascular Someone like [facility] who does covered stents Will forward to Dr Yetta Barre who ordered US in October to make sure appr F/U done

## 2011-08-14 NOTE — Assessment & Plan Note (Signed)
Well controlled.  Continue current medications and low sodium Dash type diet.    

## 2011-08-14 NOTE — Assessment & Plan Note (Signed)
Cholesterol is at goal.  Continue current dose of statin and diet Rx.  No myalgias or side effects.  F/U  LFT's in 6 months. Lab Results  Component Value Date   LDLCALC 77 07/21/2011

## 2011-08-14 NOTE — Progress Notes (Signed)
77 yo previously seen by Dr Ladona Ridgel in [place].  Distant history of CABG ? 95 with Dr Andrey Campanile No records in [place].  Seen by Dr Yetta Barre with abnormal ECG. ECG reviewed from 07/21/11  NSR rate 57 poor R wave progression LAD and IVCD.  Compared to ECG done 05/06/10 no significant change except rate then only 50. In August last year had presyncope and beta blocker dose decreased.  Activity limited by arthritis.  No SSCP or dyspnea.  Pain in legs is arthritic not PVD.  Prevoius AAA repair with no abdominal pain US done 10/19 reviewed  Report by Dr Denny Levy indicates aneurysm over 6cm.  Review of images to my eye shows cross sectional diameter closer to 5-5.5 cm.  ? F/U arranged.  His activity is limited by arthritis.  Widowed last 4 years has a son that looks on him regularly.  No presyncope since ER visit last August when beta blocker was decreased.  Echo at that time showed EF 50-55% with mild AR and mild MR  ROS: Denies fever, malais, weight loss, blurry vision, decreased visual acuity, cough, sputum, SOB, hemoptysis, pleuritic pain, palpitaitons, heartburn, abdominal pain, melena, lower extremity edema, claudication, or rash.  All other systems reviewed and negative   General: Affect appropriate Healthy:  appears stated age HEENT: normal Neck supple with no adenopathy JVP normal no bruits no thyromegaly Lungs clear with no wheezing and good diaphragmatic motion Heart:  S1/S2 SEM murmur,rub, gallop or click PMI normal Abdomen: benighn, BS positve, no tenderness,  AAA palpable previous repair not tender no bruit.  No HSM or HJR Distal pulses intact with no bruits No edema Neuro non-focal Skin warm and dry Arthritis in hand No muscular weakness  Medications Current Outpatient Prescriptions  Medication Sig Dispense Refill  . ALPRAZolam (XANAX) 0.5 MG tablet Take 0.5 mg by mouth 3 (three) times daily as needed.        Marland Kitchen aspirin 81 MG EC tablet Take 81 mg by mouth daily.        . fish oil-omega-3  fatty acids 1000 MG capsule Take 1-2 g by mouth 3 (three) times daily.        . hydrochlorothiazide 25 MG tablet Take 25 mg by mouth daily.        Marland Kitchen ibuprofen (ADVIL,MOTRIN) 200 MG tablet Take 200 mg by mouth every 6 (six) hours as needed.        Marland Kitchen omeprazole (PRILOSEC) 20 MG capsule Take 20 mg by mouth 2 (two) times daily.        . phenylephrine-shark liver oil-mineral oil-petrolatum (PREPARATION H) 0.25-3-14-71.9 % rectal ointment        . polyethylene glycol powder (GLYCOLAX/MIRALAX) powder Take 17 g by mouth daily.        . ranitidine (ZANTAC) 150 MG capsule Take 150 mg by mouth 2 (two) times daily.        . rosuvastatin (CRESTOR) 20 MG tablet Take 20 mg by mouth at bedtime.        . traMADol (ULTRAM) 50 MG tablet prn        Allergies Review of patient's allergies indicates no known allergies.  Family History: Family History  Problem Relation Age of Onset  . Prostate cancer Other   . Hypertension Other     Social History: History   Social History  . Marital Status: Widowed    Spouse Name: N/A    Number of Children: N/A  . Years of Education: N/A   Occupational History  .  Retired/[employer]    Social History Main Topics  . Smoking status: Former Smoker    Quit date: 10/06/1975  . Smokeless tobacco: Not on file  . Alcohol Use: No  . Drug Use: No  . Sexually Active: Not Currently   Other Topics Concern  . 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

## 2011-08-14 NOTE — Assessment & Plan Note (Signed)
Distant CABG.  No angina  ASA no indication for further w/u

## 2011-08-14 NOTE — Patient Instructions (Signed)
Your physician wants you to follow-up in: ONE YEAR You will receive a reminder letter in the mail two months in advance. If you don't receive a letter, please call our office to schedule the follow-up appointment.  

## 2011-08-17 ENCOUNTER — Other Ambulatory Visit: Payer: Self-pay | Admitting: Internal Medicine

## 2011-08-17 DIAGNOSIS — I714 Abdominal aortic aneurysm, without rupture: Secondary | ICD-10-CM

## 2011-09-18 ENCOUNTER — Encounter: Payer: Self-pay | Admitting: Surgery

## 2011-10-02 ENCOUNTER — Encounter: Payer: Self-pay | Admitting: Surgery

## 2011-10-05 ENCOUNTER — Other Ambulatory Visit: Payer: Medicare Other

## 2011-10-05 ENCOUNTER — Encounter: Payer: Self-pay | Admitting: Surgery

## 2011-10-05 ENCOUNTER — Ambulatory Visit (INDEPENDENT_AMBULATORY_CARE_PROVIDER_SITE_OTHER): Payer: Medicare Other | Admitting: Surgery

## 2011-10-05 VITALS — BP 140/84 | HR 74 | Resp 16 | Ht 69.0 in | Wt 205.0 lb

## 2011-10-05 DIAGNOSIS — I714 Abdominal aortic aneurysm, without rupture: Secondary | ICD-10-CM | POA: Insufficient documentation

## 2011-10-05 NOTE — Progress Notes (Signed)
[facility]   Patient name: Miguel Swanson MRN: [id number] DOB: 1926/04/30 Sex: male   Referred by: Dr. Eden Emms  Reason for referral:  Chief Complaint  Patient presents with  . Aneurysm    Abdom. aneurysm Ref by Dr. Eden Emms    HISTORY OF PRESENT ILLNESS: The patient comes in today for evaluation of an abdominal aortic aneurysm. The patient has undergone open abdominal aortic aneurysm by Dr. early in the mid 1990s. A recent ultrasound was done in October which indicates a greater than 6 cm aneurysm. This is similar and appearance to CT angiogram performed in 2011. The patient denies any abdominal or back pain.  The patient is status post coronary artery bypass grafting in the 1990s. He has a recent echo which shows an ejection fraction of 50-55% with mild aortic and mitral regurgitation. Otherwise, the patient has no cardiac symptoms.  The patient suffers from hypertension which is well controlled on his current medication and low-sodium diet. He also suffers from hyperlipidemia his most recent LDL was 77. He continues to take a statin. The patient is a former smoker he quit in 1977.  Past Medical History  Diagnosis Date  . Abdominal aneurysm without mention of rupture   . Other diseases of lung, not elsewhere classified   . Malignant neoplasm of bladder, part unspecified   . Coronary atherosclerosis of unspecified type of vessel, native or graft   . Peripheral vascular disease, unspecified   . Unspecified essential hypertension   . Esophageal reflux   . Hyperlipidemia   . AAA (abdominal aortic aneurysm)     Past Surgical History  Procedure Date  . Coronary artery bypass graft   . Abdominal aortic aneurysm repair   . Inguinal hernia repair     Bilateral    History   Social History  . Marital Status: Widowed    Spouse Name: N/A    Number of Children: N/A  . Years of Education: N/A   Occupational History  . Retired/[employer]     Social History Main Topics  . Smoking status: Former Smoker    Quit date: 10/06/1975  . Smokeless tobacco: Not on file  . Alcohol Use: No  . Drug Use: No  . Sexually Active: Not Currently   Other Topics Concern  . 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 Prostate cancer Other   . Hypertension Other   . Cancer Father   . Heart disease Son     Allergies as of 10/05/2011  . (No Known Allergies)    Current Outpatient Prescriptions on File Prior to Visit  Medication Sig Dispense Refill  . ALPRAZolam (XANAX) 0.5 MG tablet Take 0.5 mg by mouth 3 (three) times daily as needed.        Marland Kitchen aspirin 81 MG EC tablet Take 81 mg by mouth daily.        . fish oil-omega-3 fatty acids 1000 MG capsule Take 1-2 g by mouth 3 (three) times daily.        . hydrochlorothiazide 25 MG tablet Take 25 mg by mouth daily.        Marland Kitchen ibuprofen (ADVIL,MOTRIN) 200 MG tablet Take 200 mg by mouth every 6 (six) hours as needed.        Marland Kitchen omeprazole (PRILOSEC) 20 MG capsule Take 20 mg by  mouth 2 (two) times daily.        . phenylephrine-shark liver oil-mineral oil-petrolatum (PREPARATION H) 0.25-3-14-71.9 % rectal ointment        . polyethylene glycol powder (GLYCOLAX/MIRALAX) powder Take 17 g by mouth daily.        . ranitidine (ZANTAC) 150 MG capsule Take 150 mg by mouth 2 (two) times daily.        . rosuvastatin (CRESTOR) 20 MG tablet Take 20 mg by mouth at bedtime.        . traMADol (ULTRAM) 50 MG tablet prn         REVIEW OF SYSTEMS: Positive for shortness of breath when lying flat and pain in his feet when lying flat on occasion. Positive for wheezing. Neuro: Positive for weakness in his arms and legs as well as numbness. Positive for dizziness. GI is negative for blood in the stool. GU is positive for blood in the urine. He is followed by urology for a bladder tumor. All other systems  are negative  PHYSICAL EXAMINATION: General: The patient appears their stated age.  Vital signs are BP 140/84  Pulse 74  Resp 16  Ht 5\' 9"  (1.753 m)  Wt 205 lb (92.987 kg)  BMI 30.27 kg/m2  SpO2 96% HEENT:  No gross abnormalities Pulmonary: Respirations are non-labored Abdomen: Soft and non-tender  Musculoskeletal: There are no major deformities.   Neurologic: No focal weakness or paresthesias are detected, Skin: There are no ulcer or rashes noted. Psychiatric: The patient has normal affect. Cardiovascular: There is a regular rate and rhythm without significant murmur appreciated. No carotid bruits palpable femoral popliteal and pedal pulses bilaterally  Diagnostic Studies: None performed today  Outside Studies/Documentation Historical records were reviewed.  They showed greater than 6 cm suprarenal abdominal aortic aneurysm  Medication Changes: None  Assessment:   supra-renal abdominal aortic aneurysm Plan: I discussed the severity of the size of his aneurysm with the patient. I do not feel the patient is an ideal open surgical candidate and based on his CT scan from a year ago he would require a complex branched graft in order to keep this minimally invasive. This has not procedure and we offer. I have elected to send the patient to [facility] to see Dr. Bennie Pierini for further discussions. I have also discussed this with Dr. early who is in agreement     V. Charlena Cross, M.D. [facility] [place] Office: [phone] Pager:  [phone]

## 2011-11-13 ENCOUNTER — Other Ambulatory Visit: Payer: Self-pay | Admitting: Internal Medicine

## 2011-12-18 ENCOUNTER — Other Ambulatory Visit: Payer: Self-pay | Admitting: Internal Medicine

## 2012-01-07 ENCOUNTER — Other Ambulatory Visit: Payer: Self-pay | Admitting: Internal Medicine

## 2012-02-09 ENCOUNTER — Other Ambulatory Visit: Payer: Self-pay | Admitting: Internal Medicine

## 2012-03-16 ENCOUNTER — Other Ambulatory Visit: Payer: Self-pay | Admitting: Internal Medicine

## 2012-04-20 ENCOUNTER — Other Ambulatory Visit: Payer: Self-pay | Admitting: Internal Medicine

## 2012-06-02 ENCOUNTER — Other Ambulatory Visit: Payer: Self-pay | Admitting: Internal Medicine

## 2012-07-08 ENCOUNTER — Encounter: Payer: Self-pay | Admitting: Internal Medicine

## 2012-07-08 ENCOUNTER — Telehealth: Payer: Self-pay | Admitting: Internal Medicine

## 2012-07-08 ENCOUNTER — Ambulatory Visit (INDEPENDENT_AMBULATORY_CARE_PROVIDER_SITE_OTHER): Payer: Medicare Other | Admitting: Internal Medicine

## 2012-07-08 VITALS — BP 122/70 | HR 75 | Temp 98.2°F | Ht 69.0 in | Wt 188.0 lb

## 2012-07-08 DIAGNOSIS — R5381 Other malaise: Secondary | ICD-10-CM

## 2012-07-08 DIAGNOSIS — M479 Spondylosis, unspecified: Secondary | ICD-10-CM

## 2012-07-08 DIAGNOSIS — I714 Abdominal aortic aneurysm, without rupture: Secondary | ICD-10-CM

## 2012-07-08 DIAGNOSIS — M47819 Spondylosis without myelopathy or radiculopathy, site unspecified: Secondary | ICD-10-CM | POA: Insufficient documentation

## 2012-07-08 MED ORDER — TRAMADOL HCL 50 MG PO TABS: 50.0000 mg | ORAL_TABLET | Freq: Four times a day (QID) | ORAL | Status: DC | PRN

## 2012-07-08 MED ORDER — HYDROCODONE-ACETAMINOPHEN 5-500 MG PO TABS: 1.0000 | ORAL_TABLET | Freq: Four times a day (QID) | ORAL | Status: DC | PRN

## 2012-07-08 NOTE — Telephone Encounter (Signed)
Caller: Michael/Son; Patient Name: Miguel Swanson; PCP: Sanda Linger (Adults only); Best Callback Phone Number: [phone]  Son states patient has history of arthritic back pain. States patient was prescribed Tramadol in the past for back pain. Patient has history of Abdominal Aortic Aneurysm Repair 01/2012. States patient is status post repair of stent in Aorta 07/04/12 per Dr. Bennie Pierini at [facility]. States patient developed increase in lower back pain since procedure 07/04/12. States he has spoken with Dr. Danae Orleans nurse 07/05/12 and placed another call on 07/06/12 but has not received a return call. States pain increases with movement. States pain occasionally radiates to abdomen. States pain has not increased since onset 07/04/12. Denies new weakness, dizziness, numbness or tingling. States patient is able to ambulate in an upright position. Afebrile. Deneis urinary symptoms or flank pain. Denies chest pain or shortness of breath. States pain does increase with coughing or straining. Triage per Back Symptoms Protocol. No emergent symptoms identified. Care advice given per guidelines related to positive triage assessment for " Pain intensifies with coughing, sneezing or straining." Call back parameters reviewed. Son verbalizes understanding. Appointment scheduled for 07/08/12 1430 with Dr. Rene Paci.

## 2012-07-08 NOTE — Progress Notes (Signed)
Subjective:    Patient ID: Miguel Swanson, male    DOB: October 05, 1926, 78 y.o.   MRN: [id number]  HPI  Complains of postoperative pain Onset Monday night following second repair of AAA at [facility] 07/04/12 Discharge instructions reviewed: Instructed to call for abdominal or back pain unrelieved by pain pills, no pain prescription provided at discharge Previously used oxycodone after first AAA repair in March 2013 Pt son has called [facility] surgeons office > told that they would call hydrocodone to his pharmacy, but pharmacy has not received prescription for same Reports current abdominal and back pain are different than usual back arthritis symptoms Denies radiation of pain into the legs or chest. Not associated with fever, nausea, vomiting, diarrhea -good by mouth intake Also reports he is out of tramadol which he takes for usual arthritis pain  Past Medical History  Diagnosis Date  . Other diseases of lung, not elsewhere classified   . Malignant neoplasm of bladder, part unspecified   . Coronary atherosclerosis of unspecified type of vessel, native or graft     cabg  . Peripheral vascular disease, unspecified   . Unspecified essential hypertension   . Esophageal reflux   . Hyperlipidemia   . AAA (abdominal aortic aneurysm) 12/2011, 9/20131    s/p endovasc graft repair [facility], repeat 06/2012  . Chronic low back pain     ddd    Review of Systems  Constitutional: Positive for fatigue. Negative for fever.  Respiratory: Negative for cough and shortness of breath.   Cardiovascular: Negative for chest pain and leg swelling.  Neurological: Negative for headaches.       Objective:   Physical Exam BP 122/70  Pulse 75  Temp 98.2 F (36.8 C) (Oral)  Ht 5\' 9"  (1.753 m)  Wt 188 lb (85.276 kg)  BMI 27.76 kg/m2  SpO2 97% Wt Readings from Last 3 Encounters:  07/08/12 188 lb (85.276 kg)  10/05/11 205 lb (92.987 kg)  08/14/11 208 lb (94.348 kg)   Constitutional:  He is  elderly and hard of hearing, but appears well-developed and well-nourished. No distress. Son at side Neck: Normal range of motion. Neck supple. No JVD present. No thyromegaly present.  Cardiovascular: Normal rate, regular rhythm and normal heart sounds.  No murmur heard. no BLE edema Pulmonary/Chest: Effort normal and breath sounds normal. No respiratory distress. no wheezes.  Abdominal: Soft. Bowel sounds are normal. Patient exhibits no distension. There is no tenderness.  Musculoskeletal: Thoracic and lumbar vertebra nontender to palpation. No gross deformities, no joint effusions Skin: Skin is warm and dry.  No erythema or ulceration.  Psychiatric: he has a normal mood and affect. behavior is normal. Judgment and thought content normal.   Lab Results  Component Value Date   WBC 7.4 07/21/2011   HGB 13.9 07/21/2011   HCT 41.7 07/21/2011   PLT 160.0 07/21/2011   GLUCOSE 101* 07/21/2011   CHOL 135 07/21/2011   TRIG 99.0 07/21/2011   HDL 37.90* 07/21/2011   LDLCALC 77 07/21/2011   ALT 22 07/21/2011   AST 23 07/21/2011   NA 139 07/21/2011   K 3.7 07/21/2011   CL 104 07/21/2011   CREATININE 0.9 07/21/2011   BUN 19 07/21/2011   CO2 28 07/21/2011   TSH 3.28 07/21/2011   INR 1.13 05/06/2010   HGBA1C 6.0 07/21/2011      Assessment & Plan:   Postop pain s/p AAA re-repair 07/04/12 -  symptoms in abdomen and back, reports different than  usual arthritis pain- GI and vascular exam intact/benign  Deconditioning, related to low back arthritis and acute decompensation following surgery   No pain medications prescribed at discharge 07/04/12 per patient instruction review, on oxycodone after 12/2011 discharge  Prescribe hydrocodone to use as needed for postop pain Refill tramadol to use after hydrocodone complete for usual arthritis and chronic low back pain Home health PT/OT for stability and conditioning following surgery  Patient and son reassured, and they agree to call if symptoms  worse or unimproved

## 2012-07-08 NOTE — Patient Instructions (Addendum)
It was good to see you today. We have reviewed your prior records including labs and tests today Use hydrocodone for control of postoperative pain as discussed After you have completed hydrocodone, okay to use tramadol as needed for arthritis pain Your prescription(s) have been submitted to your pharmacy. Please take as directed and contact our office if you believe you are having problem(s) with the medication(s). we'll make referral to home health for therapy to help with reconditioning and strength. Our office will contact you regarding appointment(s) once made. Followup with Dr. Yetta Barre and Dr. Pearlean Brownie at [facility] as planned, call sooner if problems

## 2012-08-23 ENCOUNTER — Encounter: Payer: Self-pay | Admitting: Internal Medicine

## 2012-08-23 ENCOUNTER — Ambulatory Visit (INDEPENDENT_AMBULATORY_CARE_PROVIDER_SITE_OTHER): Payer: Medicare Other | Admitting: Internal Medicine

## 2012-08-23 VITALS — BP 148/92 | HR 82 | Temp 97.8°F | Ht 69.0 in | Wt 181.0 lb

## 2012-08-23 DIAGNOSIS — Z23 Encounter for immunization: Secondary | ICD-10-CM

## 2012-08-23 DIAGNOSIS — G2581 Restless legs syndrome: Secondary | ICD-10-CM

## 2012-08-23 MED ORDER — ROPINIROLE HCL 0.25 MG PO TABS: ORAL_TABLET | ORAL | Status: DC

## 2012-08-23 NOTE — Progress Notes (Signed)
Subjective:    Patient ID: Miguel Swanson, male    DOB: March 12, 1926, 78 y.o.   MRN: [id number]  HPI  Pt presents to the clinic today with c/o insomnia due to a weird feeling in his legs. This started 1-2 years ago. It only occurs when he sleeps. He has a hard time describing the feeling in his legs but says it is not painful. It almost feels like something is crawling on me, a tingling kind of feeling. He is only able to sleep for a couple of hours at a time. He is constantly moving his legs to try to find a comfortable position. He comes in today looking for evaluation.  Review of Systems      Past Medical History  Diagnosis Date  . Other diseases of lung, not elsewhere classified   . Malignant neoplasm of bladder, part unspecified   . Coronary atherosclerosis of unspecified type of vessel, native or graft     cabg  . Peripheral vascular disease, unspecified   . Unspecified essential hypertension   . Esophageal reflux   . Hyperlipidemia   . AAA (abdominal aortic aneurysm) 12/2011, 9/20131    s/p endovasc graft repair [facility], repeat 06/2012  . Chronic low back pain     ddd    Current Outpatient Prescriptions  Medication Sig Dispense Refill  . ALPRAZolam (XANAX) 0.5 MG tablet Take 0.5 mg by mouth 3 (three) times daily as needed.        Marland Kitchen aspirin 81 MG EC tablet Take 81 mg by mouth daily.        . fish oil-omega-3 fatty acids 1000 MG capsule Take 1-2 g by mouth 3 (three) times daily.        . hydrochlorothiazide 25 MG tablet Take 25 mg by mouth daily.        . metoprolol succinate (TOPROL-XL) 25 MG 24 hr tablet Take 25 mg by mouth 2 (two) times daily.      Marland Kitchen omeprazole (PRILOSEC) 20 MG capsule Take 20 mg by mouth 2 (two) times daily.        . polyethylene glycol powder (GLYCOLAX/MIRALAX) powder Take 17 g by mouth daily.        . ranitidine (ZANTAC) 150 MG capsule Take 150 mg by mouth 2 (two) times daily.        . rosuvastatin (CRESTOR) 20 MG tablet Take 20 mg by mouth at bedtime.         . traMADol (ULTRAM) 50 MG tablet Take 1 tablet (50 mg total) by mouth every 6 (six) hours as needed for pain.  75 tablet  0  . HYDROcodone-acetaminophen (VICODIN) 5-500 MG per tablet Take 1 tablet by mouth every 6 (six) hours as needed for pain.  40 tablet  0  . rOPINIRole (REQUIP) 0.25 MG tablet Take 1 tablet 2 hours before bedtime  30 tablet  2    No Known Allergies  Family History  Problem Relation Age of Onset  . Prostate cancer Other   . Hypertension Other   . Cancer Father   . Heart disease Son     History   Social History  . Marital Status: Widowed    Spouse Name: N/A    Number of Children: N/A  . Years of Education: N/A   Occupational History  . Retired/[employer]    Social History Main Topics  . Smoking status: Former Smoker    Quit date: 10/06/1975  . Smokeless tobacco: Not on file  .  Alcohol Use: No  . Drug Use: No  . Sexually Active: Not Currently   Other Topics Concern  . 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  Musculoskeletal: Pt reports abnormal sensation in bilateral legs. Denies decrease in range of motion, difficulty with gait, muscle pain or joint pain and swelling.  Skin: Denies redness, rashes, lesions or ulcercations.  Neurological: Denies dizziness, difficulty with memory, difficulty with speech or problems with balance and coordination.   No other specific complaints in a complete review of systems (except as listed in HPI above).  Objective:   Physical Exam   BP 148/92  Pulse 82  Temp 97.8 F (36.6 C) (Oral)  Ht 5\' 9"  (1.753 m)  Wt 181 lb (82.101 kg)  BMI 26.73 kg/m2  SpO2 94% Wt Readings from Last 3 Encounters:  08/23/12 181 lb (82.101 kg)  07/08/12 188 lb (85.276 kg)  10/05/11 205 lb (92.987 kg)    General: Appears his stated age, well developed, well  nourished in NAD. Cardiovascular: Normal rate and rhythm. S1,S2 noted.  No murmur, rubs or gallops noted. No JVD or BLE edema. No carotid bruits noted. Pulmonary/Chest: Normal effort and positive vesicular breath sounds. No respiratory distress. No wheezes, rales or ronchi noted.  Musculoskeletal: Normal range of motion. No signs of joint swelling. No difficulty with gait.  Neurological: Alert and oriented. Cranial nerves II-XII intact. Coordination normal. +DTRs bilaterally. Psychiatric: Mood and affect normal. Behavior is normal. Judgment and thought content normal.       Assessment & Plan:   Restless Leg Syndrome, new onset with additional workup required:  Take Requip .25 mg 2 hours before bedtime  RTC in 2 weeks for follow up

## 2012-08-23 NOTE — Patient Instructions (Addendum)
Restless Legs Syndrome Restless legs syndrome is a movement disorder. It may also be called a sensori-motor disorder.  CAUSES  No one knows what specifically causes restless legs syndrome, but it tends to run in families. It is also more common in people with low iron, in pregnancy, in people who need dialysis, and those with nerve damage (neuropathy).Some medications may make restless legs syndrome worse.Those medications include drugs to treat high blood pressure, some heart conditions, nausea, colds, allergies, and depression. SYMPTOMS Symptoms include uncomfortable sensations in the legs. These leg sensations are worse during periods of inactivity or rest. They are also worse while sitting or lying down. Individuals that have the disorder describe sensations in the legs that feel like:  Pulling.  Drawing.  Crawling.  Worming.  Boring.  Tingling.  Pins and needles.  Prickling.  Pain. The sensations are usually accompanied by an overwhelming urge to move the legs. Sudden muscle jerks may also occur. Movement provides temporary relief from the discomfort. In rare cases, the arms may also be affected. Symptoms may interfere with going to sleep (sleep onset insomnia). Restless legs syndrome may also be related to periodic limb movement disorder (PLMD). PLMD is another more common motor disorder. It also causes interrupted sleep. The symptoms from PLMD usually occur most often when you are awake. TREATMENT  Treatment for restless legs syndrome is symptomatic. This means that the symptoms are treated.   Massage and cold compresses may provide temporary relief.  Walk, stretch, or take a cold or hot bath.  Get regular exercise and a good night's sleep.  Avoid caffeine, alcohol, nicotine, and medications that can make it worse.  Do activities that provide mental stimulation like discussions, needlework, and video games. These may be helpful if you are not able to walk or  stretch. Some medications are effective in relieving the symptoms. However, many of these medications have side effects. Ask your caregiver about medications that may help your symptoms. Correcting iron deficiency may improve symptoms for some patients. Document Released: 09/11/2002 Document Revised: 12/14/2011 Document Reviewed: 12/18/2010 Exit[employer] Patient Information 2013 [employer].  

## 2012-09-06 ENCOUNTER — Other Ambulatory Visit: Payer: Self-pay | Admitting: Internal Medicine

## 2012-09-23 ENCOUNTER — Telehealth: Payer: Self-pay

## 2012-09-23 NOTE — Telephone Encounter (Signed)
I don't know anything about this

## 2012-09-23 NOTE — Telephone Encounter (Signed)
Patient son called [facility] stating that they feel current strength of requip is too strong 2.5mg . They would like to decrease dose. Thanks Please advise

## 2012-09-26 NOTE — Telephone Encounter (Signed)
Pt's son informed of NP's advisement. Is it okay for pt to take 1/2 tablet instead to lessen the medication's side effects or if pt needs to come in for OV.

## 2012-09-26 NOTE — Telephone Encounter (Signed)
Ash, Can you please call Mr. Devan and let him know the dose is 0.25 mg not 2.5 mg. This is the standard dose. Thx! Rene Kocher

## 2012-09-26 NOTE — Telephone Encounter (Signed)
Left message for pt's son to callback office.

## 2012-09-26 NOTE — Telephone Encounter (Signed)
Ash, It is okay for the patient to take 1/2 tablet, but it may not be as effective. Thx! Rene Kocher

## 2012-09-27 NOTE — Telephone Encounter (Signed)
Pt's son informed of NP's advisement.

## 2012-10-06 ENCOUNTER — Other Ambulatory Visit (INDEPENDENT_AMBULATORY_CARE_PROVIDER_SITE_OTHER): Payer: Medicare Other

## 2012-10-06 ENCOUNTER — Ambulatory Visit (INDEPENDENT_AMBULATORY_CARE_PROVIDER_SITE_OTHER)
Admission: RE | Admit: 2012-10-06 | Discharge: 2012-10-06 | Disposition: A | Discharge status: Home / Self Care | Payer: Medicare Other | Source: Ambulatory Visit | Attending: Internal Medicine | Admitting: Internal Medicine

## 2012-10-06 ENCOUNTER — Encounter: Payer: Self-pay | Admitting: Internal Medicine

## 2012-10-06 ENCOUNTER — Ambulatory Visit (INDEPENDENT_AMBULATORY_CARE_PROVIDER_SITE_OTHER): Payer: Medicare Other | Admitting: Internal Medicine

## 2012-10-06 VITALS — BP 136/74 | HR 67 | Temp 97.9°F | Resp 10 | Wt 172.1 lb

## 2012-10-06 DIAGNOSIS — I714 Abdominal aortic aneurysm, without rupture, unspecified: Secondary | ICD-10-CM

## 2012-10-06 DIAGNOSIS — R0602 Shortness of breath: Secondary | ICD-10-CM

## 2012-10-06 DIAGNOSIS — I10 Essential (primary) hypertension: Secondary | ICD-10-CM

## 2012-10-06 DIAGNOSIS — R110 Nausea: Secondary | ICD-10-CM

## 2012-10-06 DIAGNOSIS — K219 Gastro-esophageal reflux disease without esophagitis: Secondary | ICD-10-CM

## 2012-10-06 LAB — CBC WITH DIFFERENTIAL/PLATELET
Basophils Relative: 0.7 % (ref 0.0–3.0)
Hemoglobin: 12.2 g/dL — ABNORMAL LOW (ref 13.0–17.0)
Lymphocytes Relative: 21.4 % (ref 12.0–46.0)
MCHC: 33.9 g/dL (ref 30.0–36.0)
Monocytes Relative: 5.9 % (ref 3.0–12.0)
Neutro Abs: 5.5 10*3/uL (ref 1.4–7.7)
RBC: 3.88 Mil/uL — ABNORMAL LOW (ref 4.22–5.81)

## 2012-10-06 LAB — HEPATIC FUNCTION PANEL
ALT: 16 U/L (ref 0–53)
Alkaline Phosphatase: 117 U/L (ref 39–117)
Bilirubin, Direct: 0.4 mg/dL — ABNORMAL HIGH (ref 0.0–0.3)
Total Protein: 7.5 g/dL (ref 6.0–8.3)

## 2012-10-06 LAB — COMPREHENSIVE METABOLIC PANEL
AST: 21 U/L (ref 0–37)
BUN: 24 mg/dL — ABNORMAL HIGH (ref 6–23)
Calcium: 9.1 mg/dL (ref 8.4–10.5)
Chloride: 96 mEq/L (ref 96–112)
Creatinine, Ser: 1.2 mg/dL (ref 0.4–1.5)
GFR: 62.4 mL/min (ref >60.00)
Total Bilirubin: 1.7 mg/dL — ABNORMAL HIGH (ref 0.3–1.2)

## 2012-10-06 NOTE — Patient Instructions (Addendum)
1. Stomach and digestion - exam is not revealing the problem. Plan X-ray of abdomen today  Increase prilosec to 2 doses (40 mg ) every morning.  Take Rantidine in the PM  Take milk of magnesia 1 capful every 4 hours x 3 doses and then once a day for constipation and regulation of your bowels  Lab work today to check for liver or pancrease problems.  2. Shortness of breath - lungs do not have the sound of fluid or pneumonia. Oxygen level is very good when we measure it. Plan Chest x-ray today  Will schedule you for a 2 D echo - an ultrasound of the heart to evaluate heart function as possible cause of shortness of breath.  3. Aneurysm - doubt any complications from this causing your symptoms.  Continue all your other medications.

## 2012-10-06 NOTE — Progress Notes (Signed)
Subjective:    Patient ID: Miguel Swanson, male    DOB: [birth year], 79 y.o.   MRN: [id number]  HPI Miguel Swanson presents with complaint of early satiety and nausea with eating. He has been constipated but reports that his stools are hard but not black or dark.  He also complains of having a hard time getting his breath - he feels short of breath. Oxygen saturation is 98%  He was seen by Mrs. Baity Nov 19th and was started on Requip for restless leg syndrome. He reports the onset of GI symptoms within several days of starting medication. He stopped taking medication after 4 days. He also reports that he couldn't sleep; complains of hallucinations.Marland Kitchen   He reports that he had repair of AAA in March '13. He was [facility] at [facility] for post-op care. Last note is from April '13 - he had anuerysmal leak at the renal artery. He had a repeat angiogram in Sept '13 followed by some type of outpatient surgery - ?stent placement. He has not felt well since.  He does report that he checks his blood pressure and weight daily. BP is variable but in range; weight is down 10 lbs since Christmas. He has no appetite but has been trying to eat for the calories.   Chart reviewed: Carotid dopplers in '11 with non-obstructive disease; 2 D echo Aug '11 - EF 50-55% w/o wall motion abnormality.   Past Medical History  Diagnosis Date  . Other diseases of lung, not elsewhere classified   . Malignant neoplasm of bladder, part unspecified   . Coronary atherosclerosis of unspecified type of vessel, native or graft     cabg  . Peripheral vascular disease, unspecified   . Unspecified essential hypertension   . Esophageal reflux   . Hyperlipidemia   . AAA (abdominal aortic aneurysm) 12/2011, 9/20131    s/p endovasc graft repair [facility], repeat 06/2012  . Chronic low back pain     ddd   Past Surgical History  Procedure Date  . Coronary artery bypass graft   . Abdominal aortic aneurysm repair 12/2011, 07/04/12  .  Inguinal hernia repair     Bilateral   Family History  Problem Relation Age of Onset  . Prostate cancer Other   . Hypertension Other   . Cancer Father   . Heart disease Son    History   Social History  . Marital Status: Widowed    Spouse Name: N/A    Number of Children: N/A  . Years of Education: N/A   Occupational History  . Retired/[employer]    Social History Main Topics  . Smoking status: Former Smoker    Quit date: 10/06/1975  . Smokeless tobacco: Not on file  . Alcohol Use: No  . Drug Use: No  . Sexually Active: Not Currently   Other Topics Concern  . 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 ALPRAZolam (XANAX) 0.5 MG tablet Take 0.5 mg by mouth 3 (three) times daily as needed.        Marland Kitchen aspirin 81 MG EC tablet Take 81 mg by mouth daily.        . fish oil-omega-3 fatty acids 1000 MG capsule Take 1-2 g by mouth 3 (three) times daily.        . hydrochlorothiazide 25  MG tablet Take 25 mg by mouth daily.        Marland Kitchen HYDROcodone-acetaminophen (VICODIN) 5-500 MG per tablet Take 1 tablet by mouth every 6 (six) hours as needed for pain.  40 tablet  0  . metoprolol succinate (TOPROL-XL) 25 MG 24 hr tablet Take 25 mg by mouth 2 (two) times daily.      Marland Kitchen omeprazole (PRILOSEC) 20 MG capsule Take 20 mg by mouth 2 (two) times daily.        . polyethylene glycol powder (GLYCOLAX/MIRALAX) powder Take 17 g by mouth daily.        . ranitidine (ZANTAC) 150 MG capsule Take 150 mg by mouth 2 (two) times daily.        . rosuvastatin (CRESTOR) 20 MG tablet Take 20 mg by mouth at bedtime.        . traMADol (ULTRAM) 50 MG tablet Take 1 tablet (50 mg total) by mouth every 6 (six) hours as needed for pain.  75 tablet  0  . traMADol (ULTRAM) 50 MG tablet TAKE 1 TABLET BY MOUTH EVERY 6 HOURS AS NEEDED FOR PAIN  75 tablet  0  .  rOPINIRole (REQUIP) 0.25 MG tablet Take 1 tablet 2 hours before bedtime  30 tablet  2        Review of Systems  Constitutional: Positive for activity change and fatigue.  HENT: Positive for trouble swallowing. Negative for facial swelling.   Eyes: Negative.   Respiratory: Positive for cough, shortness of breath and wheezing.   Cardiovascular: Positive for chest pain.  Gastrointestinal: Positive for nausea and abdominal pain.  Genitourinary: Negative for frequency, flank pain and difficulty urinating.  Musculoskeletal: Positive for joint swelling and arthralgias.  Neurological: Negative for tremors, facial asymmetry and numbness.  Hematological: Negative.   Psychiatric/Behavioral: Negative.        Objective:   Physical Exam Filed Vitals:   10/06/12 1533  BP: 136/74  Pulse: 67  Temp: 97.9 F (36.6 C)  Resp: 10   Wt Readings from Last 3 Encounters:  10/06/12 172 lb 1.3 oz (78.055 kg)  08/23/12 181 lb (82.101 kg)  07/08/12 188 lb (85.276 kg)   Gen'l- Chronically ill appearing older man in no acute distress HEENT - C&S clear, PERRLA Neck - supple, no thyromegaly Nodes - negative cervical, axillary Cor - 2+ radial, regular; no JVD, no carotid bruits, heart sounds a bit distant, S4 gallop, regular rate with frequent PVCs Pulm - normal respirations. No increased WOB. Lungs - CTAP Abd - scaphoid, large midline scar from chest to umbilicus, hypoactive BS, no guarding or rebound, no marked tenderness to palpation, no fixed masses, doughy intestine with moveable masses.  Neuro - HOH, CN II-XII normal, MS good grip, able to get up to exam table without assist. Speech is clear, cognition seems normal. Slow gait.  Lab Results  Component Value Date   WBC 7.9 10/06/2012   HGB 12.2* 10/06/2012   HCT 36.0* 10/06/2012   PLT 198.0 10/06/2012   GLUCOSE 110* 10/06/2012   CHOL 135 07/21/2011   TRIG 99.0 07/21/2011   HDL 37.90* 07/21/2011   LDLCALC 77 07/21/2011        ALT 16 10/06/2012   AST  21 10/06/2012        NA 134* 10/06/2012   K 3.3* 10/06/2012   CL 96 10/06/2012   CREATININE 1.2 10/06/2012   BUN 24* 10/06/2012   CO2 26 10/06/2012   TSH 3.28 07/21/2011   INR 1.13 05/06/2010  HGBA1C 6.0 07/21/2011       pBNP                      1958      Amylase and Lipase normal ABDOMEN - 1 VIEW  Comparison: Scout image for CT scan dated 05/06/2010  Findings: Multiple vascular stents are in place. No dilated large  or small bowel. Stool is seen in the ascending and transverse  portions of the nondistended colon. No fecal impaction.  Rotoscoliosis of the lumbar spine. No acute osseous abnormality.  IMPRESSION:  No acute abnormalities.  CHEST - 2 VIEW  Comparison: Chest radiograph 05/06/2010  Findings: Sternotomy wires overlie enlarged heart silhouette.  There is an endo vascular stent within the descending aorta which  appears patent. Bibasilar chronic appearing interstitial lung with  superimposed air space disease. Small bilateral effusions.  IMPRESSION:  1. Mild air space disease the lower lobe superimposed on chronic  interstitial lung disease. Favor edema over infection.       Assessment & Plan:  Shortness of Breath - exam is unrevealing. Lab with normal WBC but CXR with bibasilar ASD raising concern for atypical respiratory infection: H. Influenza vs. Mycoplasma vs. Viral. BNP is mildly elevated with bilateral pleural effusions on X-ray.  Plan Will cover with azithromycin 500 mg qd x 3  Furosemide 40 mg daily x 5 days then resume HCTZ  2 D echo pending (attempted to call patient 10/09/12 - left msg. will call patient with new instructions on Monday, Jan 6th. Rx's sent to pharmacy)

## 2012-10-09 MED ORDER — AZITHROMYCIN 500 MG PO TABS: 500.0000 mg | ORAL_TABLET | Freq: Every day | ORAL | Status: DC

## 2012-10-09 MED ORDER — FUROSEMIDE 40 MG PO TABS: 40.0000 mg | ORAL_TABLET | Freq: Every day | ORAL | Status: DC

## 2012-10-09 NOTE — Assessment & Plan Note (Signed)
BP Readings from Last 3 Encounters:  10/06/12 136/74  08/23/12 148/92  07/08/12 122/70   Adequate  Control on present medications but with elevated BNP will substitue furosemide for HCTZ temporarily

## 2012-10-09 NOTE — Assessment & Plan Note (Signed)
Hgb stable. Plain abdominal film and CXR w/o evidence of stent failure or leak. No critical abdominal or chest pain to suggest leak.

## 2012-10-10 ENCOUNTER — Telehealth: Payer: Self-pay | Admitting: Internal Medicine

## 2012-10-10 ENCOUNTER — Telehealth: Payer: Self-pay | Admitting: *Deleted

## 2012-10-10 MED ORDER — AZITHROMYCIN 500 MG PO TABS: 500.0000 mg | ORAL_TABLET | Freq: Every day | ORAL | Status: DC

## 2012-10-10 MED ORDER — FUROSEMIDE 40 MG PO TABS: 40.0000 mg | ORAL_TABLET | Freq: Every day | ORAL | Status: DC

## 2012-10-10 NOTE — Telephone Encounter (Signed)
Tried calling patient to schedule, left voice mail msg on machine.

## 2012-10-10 NOTE — Telephone Encounter (Signed)
Message copied by Newell Coral on Mon Oct 10, 2012  2:26 PM ------      Message from: Jacques Navy      Created: Sun Oct 09, 2012 11:24 AM       Needs follow up appointment with Dr. Yetta Barre Thursday or Friday. thanks

## 2012-10-10 NOTE — Telephone Encounter (Signed)
Complete

## 2012-10-10 NOTE — Telephone Encounter (Signed)
Patient's son called back , pt scheduled 10/14/2012

## 2012-10-12 ENCOUNTER — Other Ambulatory Visit: Payer: Self-pay | Admitting: Internal Medicine

## 2012-10-14 ENCOUNTER — Encounter: Payer: Self-pay | Admitting: Internal Medicine

## 2012-10-14 ENCOUNTER — Ambulatory Visit (INDEPENDENT_AMBULATORY_CARE_PROVIDER_SITE_OTHER)
Admission: RE | Admit: 2012-10-14 | Discharge: 2012-10-14 | Disposition: A | Discharge status: Home / Self Care | Payer: Medicare Other | Source: Ambulatory Visit | Attending: Internal Medicine | Admitting: Internal Medicine

## 2012-10-14 ENCOUNTER — Ambulatory Visit (INDEPENDENT_AMBULATORY_CARE_PROVIDER_SITE_OTHER): Payer: Medicare Other | Admitting: Internal Medicine

## 2012-10-14 VITALS — BP 128/68 | HR 83 | Temp 98.4°F | Resp 12 | Wt 164.0 lb

## 2012-10-14 DIAGNOSIS — R0609 Other forms of dyspnea: Secondary | ICD-10-CM

## 2012-10-14 DIAGNOSIS — R0989 Other specified symptoms and signs involving the circulatory and respiratory systems: Secondary | ICD-10-CM

## 2012-10-14 DIAGNOSIS — I2510 Atherosclerotic heart disease of native coronary artery without angina pectoris: Secondary | ICD-10-CM

## 2012-10-14 DIAGNOSIS — I10 Essential (primary) hypertension: Secondary | ICD-10-CM

## 2012-10-14 DIAGNOSIS — M479 Spondylosis, unspecified: Secondary | ICD-10-CM

## 2012-10-14 DIAGNOSIS — M47819 Spondylosis without myelopathy or radiculopathy, site unspecified: Secondary | ICD-10-CM

## 2012-10-14 DIAGNOSIS — M1990 Unspecified osteoarthritis, unspecified site: Secondary | ICD-10-CM

## 2012-10-14 MED ORDER — TRAMADOL HCL 50 MG PO TABS: 50.0000 mg | ORAL_TABLET | Freq: Three times a day (TID) | ORAL | Status: DC | PRN

## 2012-10-14 NOTE — Progress Notes (Signed)
Subjective:    Patient ID: Miguel Swanson, male    DOB: 01-25-1926, 79 y.o.   MRN: [id number]  Arthritis Presents for follow-up visit. He complains of pain. He reports no stiffness, joint swelling or joint warmth. The symptoms have been worsening. Affected locations include the right knee, left knee, left hip and right hip. His pain is at a severity of 4/10. Associated symptoms include fatigue, pain at night and pain while resting. Pertinent negatives include no diarrhea, dry eyes, dry mouth, dysuria, fever, rash, Raynaud's syndrome, uveitis or weight loss.      Review of Systems  Constitutional: Positive for appetite change and fatigue. Negative for fever, chills, weight loss, diaphoresis, activity change and unexpected weight change.  HENT: Negative.   Eyes: Negative.   Respiratory: Positive for shortness of breath. Negative for apnea, cough, choking, chest tightness, wheezing and stridor.   Gastrointestinal: Negative for nausea, vomiting, abdominal pain, diarrhea and constipation.  Genitourinary: Negative.  Negative for dysuria.  Musculoskeletal: Positive for back pain, arthralgias and arthritis. Negative for myalgias, joint swelling, gait problem and stiffness.  Skin: Negative for color change, pallor, rash and wound.  Neurological: Negative for dizziness, tremors, syncope, weakness and light-headedness.  Hematological: Negative for adenopathy. Does not bruise/bleed easily.  Psychiatric/Behavioral: Negative.        Objective:   Physical Exam  Vitals reviewed. Constitutional: He is oriented to person, place, and time. He appears well-developed and well-nourished. No distress.  HENT:  Head: Normocephalic and atraumatic.  Mouth/Throat: Oropharynx is clear and moist. No oropharyngeal exudate.  Eyes: Conjunctivae normal are normal. Right eye exhibits no discharge. Left eye exhibits no discharge. No scleral icterus.  Neck: Normal range of motion. Neck supple. No JVD present. No  tracheal deviation present. No thyromegaly present.  Cardiovascular: Normal rate, regular rhythm, normal heart sounds and intact distal pulses.  Exam reveals no gallop and no friction rub.   No murmur heard. Pulmonary/Chest: Effort normal and breath sounds normal. No stridor. No respiratory distress. He has no wheezes. He has no rales. He exhibits no tenderness.  Abdominal: Soft. Bowel sounds are normal. He exhibits no distension and no mass. There is no tenderness. There is no rebound and no guarding.  Musculoskeletal: Normal range of motion. He exhibits no edema and no tenderness.  Lymphadenopathy:    He has no cervical adenopathy.  Neurological: He is oriented to person, place, and time.  Skin: Skin is warm and dry. No rash noted. He is not diaphoretic. No erythema. No pallor.  Psychiatric: He has a normal mood and affect. Judgment and thought content normal. His mood appears not anxious. His affect is not angry, not blunt, not labile and not inappropriate. His speech is delayed and tangential. His speech is not rapid and/or pressured and not slurred. He is slowed and withdrawn. He is not aggressive, is not hyperactive, not actively hallucinating and not combative. Cognition and memory are normal. He does not exhibit a depressed mood. He is communicative. He is inattentive.      Lab Results  Component Value Date   WBC 7.9 10/06/2012   HGB 12.2* 10/06/2012   HCT 36.0* 10/06/2012   PLT 198.0 10/06/2012   GLUCOSE 110* 10/06/2012   CHOL 135 07/21/2011   TRIG 99.0 07/21/2011   HDL 37.90* 07/21/2011   LDLCALC 77 07/21/2011   ALT 16 10/06/2012   ALT 16 10/06/2012   AST 21 10/06/2012   AST 21 10/06/2012   NA 134* 10/06/2012   K 3.3* 10/06/2012  CL 96 10/06/2012   CREATININE 1.2 10/06/2012   BUN 24* 10/06/2012   CO2 26 10/06/2012   TSH 3.28 07/21/2011   INR 1.13 05/06/2010   HGBA1C 6.0 07/21/2011      Assessment & Plan:

## 2012-10-14 NOTE — Patient Instructions (Signed)
Heart Failure  Heart failure (HF) is a condition in which the heart has trouble pumping blood. This means your heart does not pump blood efficiently for your body to work well. In some cases of HF, fluid may back up into your lungs or you may have swelling (edema) in your lower legs. HF is a long-term (chronic) condition. It is important for you to take good care of yourself and follow your caregiver's treatment plan.  CAUSES   · Health conditions:  · High blood pressure (hypertension) causes the heart muscle to work harder than normal. When pressure in the blood vessels is high, the heart needs to pump (contract) with more force in order to circulate blood throughout the body. High blood pressure eventually causes the heart to become stiff and weak.  · Coronary artery disease (CAD) is the buildup of cholesterol and fat (plaques) in the arteries of the heart. The blockage in the arteries deprives the heart muscle of oxygen and blood. This can cause chest pain and may lead to a heart attack. High blood pressure can also contribute to CAD.  · Heart attack (myocardial infarction) occurs when 1 or more arteries in the heart become blocked. The loss of oxygen damages the muscle tissue of the heart. When this happens, part of the heart muscle dies. The injured tissue does not contract as well and weakens the heart's ability to pump blood.  · Abnormal heart valves can cause HF when the heart valves do not open and close properly. This makes the heart muscle pump harder to keep the blood flowing.  · Heart muscle disease (cardiomyopathy or myocarditis) is damage to the heart muscle from a variety of causes. These can include drug or alcohol abuse, infections, or unknown reasons. These can increase the risk of HF.  · Lung disease makes the heart work harder because the lungs do not work properly. This can cause a strain on the heart leading it to fail.  · Diabetes increases the risk of HF. High blood sugar contributes to high  fat (lipid) levels in the blood. Diabetes can also cause slow damage to tiny blood vessels that carry important nutrients to the heart muscle. When the heart does not get enough oxygen and food, it can cause the heart to become weak and stiff. This leads to a heart that does not contract efficiently.  · Other diseases can contribute to HF. These include abnormal heart rhythms, thyroid problems, and low blood counts (anemia).  · Unhealthy lifestyle habits:  · Obesity.  · Smoking.  · Eating foods high in fat and cholesterol.  · Eating or drinking beverages high in salt.  · Drug or alcohol abuse.  · Lack of exercise.  SYMPTOMS   HF symptoms may vary and can be hard to detect. Symptoms may include:  · Shortness of breath with activity, such as climbing stairs.  · Persistent cough.  · Swelling of the feet, ankles, legs, or abdomen.  · Unexplained weight gain.  · Difficulty breathing when lying flat.  · Waking from sleep because of the need to sit up and get more air.  · Rapid heartbeat.  · Fatigue and loss of energy.  · Feeling lightheaded or close to fainting.  DIAGNOSIS   A diagnosis of HF is based on your history, symptoms, physical examination, and diagnostic tests.  Diagnostic tests for HF may include:  · EKG.  · Chest X-ray.  · Blood tests.  · Exercise stress test.  · Blood   oxygen test (arterial blood gas).  · Evaluation by a heart doctor (cardiologist).  · Ultrasound evaluation of the heart (echocardiogram).  · Heart artery test to look for blockages (angiogram).  · Radioactive imaging to look at the heart (radionuclide test).  TREATMENT   Treatment is aimed at managing the symptoms of HF. Medicines, lifestyle changes, or surgical intervention may be necessary to treat HF.  · Medicines to help treat HF may include:  · Angiotensin-converting enzyme (ACE) inhibitors. These block the effects of a blood protein called angiotensin-converting enzyme. ACE inhibitors relax (dilate) the blood vessels and help lower blood  pressure. This decreases the workload of the heart, slows the progression of HF, and improves symptoms.  · Angiotensin receptor blockers (ARBs). These medications work similar to ACE inhibitors. ARBs may be an alternative for people who cannot tolerate an ACE inhibitor.  · Aldosterone antagonists. This medication helps get rid of extra fluid from your body. This lowers the volume of blood the heart has to pump.  · Water pills (diuretics). Diuretics cause the kidneys to remove salt and water from the blood. The extra fluid is removed by urination. By removing extra fluid from the body, diuretics help lower the workload of the heart and help prevent fluid buildup in the lungs so breathing is easier.  · Beta blockers. These prevent the heart from beating too fast and improve heart muscle strength. Beta blockers help maintain a normal heart rate, control blood pressure, and improve HF symptoms.  · Digitalis. This increases the force of the heartbeat and may be helpful to people with HF or heart rhythm problems.  · Healthy lifestyle changes include:  · Stopping smoking.  · Eating a healthy diet. Avoid foods high in fat. Avoid foods fried in oil or made with fat. A dietician can help with healthy food choices.  · Limiting how much salt you eat.  · Limiting alcohol intake to no more than 1 drink per day for women and 2 drinks per day for men. Drinking more than that is harmful to your heart. If your heart has already been damaged by alcohol or you have severe HF, drinking alcohol should be stopped completely.  · Exercising as directed by your caregiver.  · Surgical treatment for HF may include:  · Procedures to open blocked arteries, repair damaged heart valves, or remove damaged heart muscle tissue.  · A pacemaker to help heart muscle function and to control certain abnormal heart rhythms.  · A defibrillator to possibly prevent sudden cardiac death.  HOME CARE INSTRUCTIONS   · Activity level. Your caregiver can help you  determine what type of exercise program may be helpful. It is important to maintain your strength. Pace your physical activity to avoid shortness of breath or chest pain. Rest for 1 hour before and after meals. A cardiac rehabilitation program may be helpful to some people with HF.  · Diet. Eat a heart healthy diet. Food choices should be low in saturated fat and cholesterol. Talk to a dietician to learn about heart healthy foods.  · Salt intake. When you have HF, you need to limit the amount of salt you eat. Eat less than 1500 milligrams (mg) of salt per day or as recommended by your caregiver.  · Weight monitoring. Weigh yourself every day. You should weigh yourself in the morning after you urinate and before you eat breakfast. Wear the same amount of clothing each time you weigh yourself. Record your weight daily. Bring your recorded   weights to your clinic visits. Tell your caregiver right away if you have gained 3 lb/1.4 kg in 1 day, or 5 lb/2.3 kg in a week or whatever amount you were told to report.  · Blood pressure monitoring. This should be done as directed by your caregiver. A home blood pressure cuff can be purchased at a drugstore. Record your blood pressure numbers and bring them to your clinic visits. Tell your caregiver if you become dizzy or lightheaded upon standing up.  · Smoking. If you are currently a smoker, it is time to quit. Nicotine makes your heart work harder by causing your blood vessels to constrict. Do not use nicotine gum or patches before talking to your caregiver.  · Follow up. Be sure to schedule a follow-up visit with your caregiver. Keep all your appointments.  SEEK MEDICAL CARE IF:   · Your weight increases by 3 lb/1.4 kg in 1 day or 5 lb/2.3 kg in a week.  · You notice increasing shortness of breath that is unusual for you. This may happen during rest, sleep, or with activity.  · You cough more than normal, especially with physical activity.  · You notice more swelling in your  hands, feet, ankles, or belly (abdomen).  · You are unable to sleep because it is hard to breathe.  · You cough up bloody mucus (sputum).  · You begin to feel "jumping" or "fluttering" sensations (palpitations) in your chest.  SEEK IMMEDIATE MEDICAL CARE IF:   · You have severe chest pain or pressure which may include symptoms such as:  · Pain or pressure in the arms, neck, jaw, or back.  · Feeling sweaty.  · Feeling sick to your stomach (nauseous).  · Feeling short of breath while at rest.  · Having a fast or irregular heartbeat.  · You experience stroke symptoms. These symptoms include:  · Facial weakness or numbness.  · Weakness or numbness in an arm, leg, or on one side of your body.  · Blurred vision.  · Difficulty talking or thinking.  · Dizziness or fainting.  · Severe headache.  MAKE SURE YOU:   · Understand these instructions.  · Will watch your condition.  · Will get help right away if you are not doing well or get worse.  Document Released: 09/21/2005 Document Revised: 03/22/2012 Document Reviewed: 01/03/2010  [employer]® Patient Information ©2013 [employer].

## 2012-10-15 NOTE — Assessment & Plan Note (Signed)
I am concerned that he is having some s/s of CHF so I have asked him to f/up with cardiology

## 2012-10-15 NOTE — Assessment & Plan Note (Signed)
I will recheck his CXR today to see if the edema and effusions have worsened

## 2012-10-15 NOTE — Assessment & Plan Note (Signed)
Continue tramadol as needed 

## 2012-10-15 NOTE — Assessment & Plan Note (Signed)
His BP is well controlled 

## 2012-10-17 ENCOUNTER — Other Ambulatory Visit: Payer: Self-pay | Admitting: *Deleted

## 2012-10-17 MED ORDER — FUROSEMIDE 40 MG PO TABS: 40.0000 mg | ORAL_TABLET | Freq: Every day | ORAL | Status: DC

## 2012-10-29 ENCOUNTER — Encounter (HOSPITAL_COMMUNITY): Payer: Self-pay | Admitting: Family Medicine

## 2012-10-29 ENCOUNTER — Emergency Department (HOSPITAL_COMMUNITY): Payer: Medicare Other

## 2012-10-29 ENCOUNTER — Inpatient Hospital Stay (HOSPITAL_COMMUNITY): Payer: Medicare Other

## 2012-10-29 ENCOUNTER — Other Ambulatory Visit: Payer: Self-pay

## 2012-10-29 ENCOUNTER — Inpatient Hospital Stay (HOSPITAL_COMMUNITY)
Admission: EM | Admit: 2012-10-29 | Discharge: 2012-11-05 | DRG: 871 | Disposition: A | Discharge status: Skilled Nursing Facility | Payer: Medicare Other | Attending: Internal Medicine | Admitting: Internal Medicine

## 2012-10-29 DIAGNOSIS — B3781 Candidal esophagitis: Secondary | ICD-10-CM

## 2012-10-29 DIAGNOSIS — J189 Pneumonia, unspecified organism: Secondary | ICD-10-CM

## 2012-10-29 DIAGNOSIS — S1093XA Contusion of unspecified part of neck, initial encounter: Secondary | ICD-10-CM | POA: Diagnosis present

## 2012-10-29 DIAGNOSIS — R1311 Dysphagia, oral phase: Secondary | ICD-10-CM | POA: Diagnosis present

## 2012-10-29 DIAGNOSIS — Z79899 Other long term (current) drug therapy: Secondary | ICD-10-CM

## 2012-10-29 DIAGNOSIS — I498 Other specified cardiac arrhythmias: Secondary | ICD-10-CM | POA: Diagnosis present

## 2012-10-29 DIAGNOSIS — I959 Hypotension, unspecified: Secondary | ICD-10-CM

## 2012-10-29 DIAGNOSIS — R001 Bradycardia, unspecified: Secondary | ICD-10-CM

## 2012-10-29 DIAGNOSIS — I5040 Unspecified combined systolic (congestive) and diastolic (congestive) heart failure: Secondary | ICD-10-CM | POA: Diagnosis present

## 2012-10-29 DIAGNOSIS — Z951 Presence of aortocoronary bypass graft: Secondary | ICD-10-CM

## 2012-10-29 DIAGNOSIS — R634 Abnormal weight loss: Secondary | ICD-10-CM | POA: Diagnosis present

## 2012-10-29 DIAGNOSIS — S0003XA Contusion of scalp, initial encounter: Secondary | ICD-10-CM | POA: Diagnosis present

## 2012-10-29 DIAGNOSIS — R1310 Dysphagia, unspecified: Secondary | ICD-10-CM

## 2012-10-29 DIAGNOSIS — Z8551 Personal history of malignant neoplasm of bladder: Secondary | ICD-10-CM

## 2012-10-29 DIAGNOSIS — I509 Heart failure, unspecified: Secondary | ICD-10-CM | POA: Diagnosis present

## 2012-10-29 DIAGNOSIS — J9600 Acute respiratory failure, unspecified whether with hypoxia or hypercapnia: Secondary | ICD-10-CM | POA: Diagnosis present

## 2012-10-29 DIAGNOSIS — A419 Sepsis, unspecified organism: Secondary | ICD-10-CM

## 2012-10-29 DIAGNOSIS — R7881 Bacteremia: Secondary | ICD-10-CM

## 2012-10-29 DIAGNOSIS — Z9889 Other specified postprocedural states: Secondary | ICD-10-CM

## 2012-10-29 DIAGNOSIS — X58XXXA Exposure to other specified factors, initial encounter: Secondary | ICD-10-CM | POA: Diagnosis present

## 2012-10-29 DIAGNOSIS — Y92009 Unspecified place in unspecified non-institutional (private) residence as the place of occurrence of the external cause: Secondary | ICD-10-CM

## 2012-10-29 DIAGNOSIS — R4182 Altered mental status, unspecified: Secondary | ICD-10-CM

## 2012-10-29 DIAGNOSIS — A4159 Other Gram-negative sepsis: Principal | ICD-10-CM | POA: Diagnosis present

## 2012-10-29 DIAGNOSIS — IMO0002 Reserved for concepts with insufficient information to code with codable children: Secondary | ICD-10-CM

## 2012-10-29 DIAGNOSIS — Z8679 Personal history of other diseases of the circulatory system: Secondary | ICD-10-CM

## 2012-10-29 DIAGNOSIS — I5043 Acute on chronic combined systolic (congestive) and diastolic (congestive) heart failure: Secondary | ICD-10-CM | POA: Diagnosis present

## 2012-10-29 DIAGNOSIS — J9601 Acute respiratory failure with hypoxia: Secondary | ICD-10-CM

## 2012-10-29 DIAGNOSIS — I059 Rheumatic mitral valve disease, unspecified: Secondary | ICD-10-CM | POA: Diagnosis present

## 2012-10-29 DIAGNOSIS — X31XXXA Exposure to excessive natural cold, initial encounter: Secondary | ICD-10-CM | POA: Diagnosis present

## 2012-10-29 DIAGNOSIS — M6282 Rhabdomyolysis: Secondary | ICD-10-CM | POA: Diagnosis present

## 2012-10-29 DIAGNOSIS — G4700 Insomnia, unspecified: Secondary | ICD-10-CM | POA: Diagnosis present

## 2012-10-29 DIAGNOSIS — E875 Hyperkalemia: Secondary | ICD-10-CM | POA: Diagnosis present

## 2012-10-29 DIAGNOSIS — R64 Cachexia: Secondary | ICD-10-CM | POA: Diagnosis present

## 2012-10-29 DIAGNOSIS — I2510 Atherosclerotic heart disease of native coronary artery without angina pectoris: Secondary | ICD-10-CM | POA: Diagnosis present

## 2012-10-29 DIAGNOSIS — D131 Benign neoplasm of stomach: Secondary | ICD-10-CM | POA: Diagnosis present

## 2012-10-29 DIAGNOSIS — R5381 Other malaise: Secondary | ICD-10-CM | POA: Diagnosis present

## 2012-10-29 DIAGNOSIS — J690 Pneumonitis due to inhalation of food and vomit: Secondary | ICD-10-CM | POA: Diagnosis present

## 2012-10-29 DIAGNOSIS — E785 Hyperlipidemia, unspecified: Secondary | ICD-10-CM

## 2012-10-29 DIAGNOSIS — I214 Non-ST elevation (NSTEMI) myocardial infarction: Secondary | ICD-10-CM | POA: Diagnosis present

## 2012-10-29 DIAGNOSIS — L02519 Cutaneous abscess of unspecified hand: Secondary | ICD-10-CM | POA: Diagnosis present

## 2012-10-29 DIAGNOSIS — Z7982 Long term (current) use of aspirin: Secondary | ICD-10-CM

## 2012-10-29 DIAGNOSIS — K219 Gastro-esophageal reflux disease without esophagitis: Secondary | ICD-10-CM | POA: Diagnosis present

## 2012-10-29 DIAGNOSIS — K224 Dyskinesia of esophagus: Secondary | ICD-10-CM | POA: Diagnosis present

## 2012-10-29 DIAGNOSIS — N179 Acute kidney failure, unspecified: Secondary | ICD-10-CM | POA: Diagnosis present

## 2012-10-29 DIAGNOSIS — T68XXXA Hypothermia, initial encounter: Secondary | ICD-10-CM | POA: Diagnosis present

## 2012-10-29 DIAGNOSIS — R195 Other fecal abnormalities: Secondary | ICD-10-CM | POA: Clinically undetermined

## 2012-10-29 HISTORY — DX: Malignant (primary) neoplasm, unspecified: C80.1

## 2012-10-29 HISTORY — DX: Atherosclerotic heart disease of native coronary artery without angina pectoris: I25.10

## 2012-10-29 HISTORY — DX: Dysphagia, unspecified: R13.10

## 2012-10-29 LAB — URINE MICROSCOPIC-ADD ON

## 2012-10-29 LAB — BASIC METABOLIC PANEL
BUN: 55 mg/dL — ABNORMAL HIGH (ref 6–23)
CO2: 22 mEq/L (ref 19–32)
Chloride: 94 mEq/L — ABNORMAL LOW (ref 96–112)
GFR calc Af Amer: 33 mL/min — ABNORMAL LOW (ref >90)
Glucose, Bld: 95 mg/dL (ref 70–99)
Potassium: 2.9 mEq/L — ABNORMAL LOW (ref 3.5–5.1)

## 2012-10-29 LAB — COMPREHENSIVE METABOLIC PANEL
ALT: 34 U/L (ref 0–53)
ALT: 35 U/L (ref 0–53)
AST: 49 U/L — ABNORMAL HIGH (ref 0–37)
Alkaline Phosphatase: 127 U/L — ABNORMAL HIGH (ref 39–117)
BUN: 51 mg/dL — ABNORMAL HIGH (ref 6–23)
CO2: 20 mEq/L (ref 19–32)
Calcium: 9.9 mg/dL (ref 8.4–10.5)
Chloride: 93 mEq/L — ABNORMAL LOW (ref 96–112)
GFR calc Af Amer: 32 mL/min — ABNORMAL LOW (ref >90)
GFR calc non Af Amer: 28 mL/min — ABNORMAL LOW (ref >90)
Glucose, Bld: 109 mg/dL — ABNORMAL HIGH (ref 70–99)
Potassium: 3.1 mEq/L — ABNORMAL LOW (ref 3.5–5.1)
Sodium: 136 mEq/L (ref 135–145)
Sodium: 136 mEq/L (ref 135–145)
Total Bilirubin: 1.1 mg/dL (ref 0.3–1.2)
Total Protein: 7 g/dL (ref 6.0–8.3)

## 2012-10-29 LAB — CBC WITH DIFFERENTIAL/PLATELET
Basophils Absolute: 0.1 10*3/uL (ref 0.0–0.1)
Basophils Relative: 0 % (ref 0–1)
Eosinophils Relative: 0 % (ref 0–5)
HCT: 35.5 % — ABNORMAL LOW (ref 39.0–52.0)
Hemoglobin: 12.4 g/dL — ABNORMAL LOW (ref 13.0–17.0)
Lymphocytes Relative: 11 % — ABNORMAL LOW (ref 12–46)
Lymphocytes Relative: 6 % — ABNORMAL LOW (ref 12–46)
Lymphs Abs: 1.3 10*3/uL (ref 0.7–4.0)
MCV: 88.3 fL (ref 78.0–100.0)
Monocytes Absolute: 1.2 10*3/uL — ABNORMAL HIGH (ref 0.1–1.0)
Monocytes Relative: 5 % (ref 3–12)
Neutro Abs: 16.1 10*3/uL — ABNORMAL HIGH (ref 1.7–7.7)
Neutro Abs: 19.8 10*3/uL — ABNORMAL HIGH (ref 1.7–7.7)
Neutrophils Relative %: 82 % — ABNORMAL HIGH (ref 43–77)
Neutrophils Relative %: 89 % — ABNORMAL HIGH (ref 43–77)
Platelets: 227 10*3/uL (ref 150–400)
RBC: 3.97 MIL/uL — ABNORMAL LOW (ref 4.22–5.81)
RDW: 13.9 % (ref 11.5–15.5)
WBC: 19.7 10*3/uL — ABNORMAL HIGH (ref 4.0–10.5)
WBC: 22.3 10*3/uL — ABNORMAL HIGH (ref 4.0–10.5)

## 2012-10-29 LAB — GLUCOSE, CAPILLARY: Glucose-Capillary: 97 mg/dL (ref 70–99)

## 2012-10-29 LAB — POCT I-STAT 3, ART BLOOD GAS (G3+)
Bicarbonate: 25 mEq/L — ABNORMAL HIGH (ref 20.0–24.0)
Bicarbonate: 28.2 mEq/L — ABNORMAL HIGH (ref 20.0–24.0)
TCO2: 26 mmol/L (ref 0–100)
TCO2: 30 mmol/L (ref 0–100)
pCO2 arterial: 34.3 mmHg — ABNORMAL LOW (ref 35.0–45.0)
pCO2 arterial: 45.3 mmHg — ABNORMAL HIGH (ref 35.0–45.0)
pH, Arterial: 7.395 (ref 7.350–7.450)
pH, Arterial: 7.463 — ABNORMAL HIGH (ref 7.350–7.450)

## 2012-10-29 LAB — URINALYSIS, ROUTINE W REFLEX MICROSCOPIC
Bilirubin Urine: NEGATIVE
Glucose, UA: NEGATIVE mg/dL
Leukocytes, UA: NEGATIVE
Nitrite: NEGATIVE
Specific Gravity, Urine: 1.014 (ref 1.005–1.030)
pH: 6.5 (ref 5.0–8.0)

## 2012-10-29 LAB — MRSA PCR SCREENING: MRSA by PCR: NEGATIVE

## 2012-10-29 LAB — POCT I-STAT, CHEM 8
Calcium, Ion: 1.04 mmol/L — ABNORMAL LOW (ref 1.13–1.30)
Chloride: 97 mEq/L (ref 96–112)
HCT: 41 % (ref 39.0–52.0)
TCO2: 24 mmol/L (ref 0–100)

## 2012-10-29 LAB — PROTIME-INR: INR: 1.42 (ref 0.00–1.49)

## 2012-10-29 LAB — CORTISOL: Cortisol, Plasma: 40.7 ug/dL

## 2012-10-29 LAB — LACTIC ACID, PLASMA: Lactic Acid, Venous: 5.7 mmol/L — ABNORMAL HIGH (ref 0.5–2.2)

## 2012-10-29 LAB — CK TOTAL AND CKMB (NOT AT ARMC)
CK, MB: 9.9 ng/mL (ref 0.3–4.0)
Relative Index: 1.5 (ref 0.0–2.5)

## 2012-10-29 MED ORDER — CHLORHEXIDINE GLUCONATE 0.12 % MT SOLN
15.0000 mL | Freq: Two times a day (BID) | OROMUCOSAL | Status: DC
  Administered 2012-10-29 – 2012-10-30 (×3): 15 mL via OROMUCOSAL
  Filled 2012-10-29 (×4): qty 15

## 2012-10-29 MED ORDER — SUCCINYLCHOLINE CHLORIDE 20 MG/ML IJ SOLN
INTRAMUSCULAR | Status: AC | PRN
  Administered 2012-10-29: 100 mg via INTRAVENOUS

## 2012-10-29 MED ORDER — SODIUM CHLORIDE 0.9 % IV SOLN
INTRAVENOUS | Status: DC
  Administered 2012-10-29: 17:00:00 via INTRAVENOUS
  Administered 2012-10-31: 20 mL/h via INTRAVENOUS

## 2012-10-29 MED ORDER — HYDROCORTISONE SOD SUCCINATE 100 MG IJ SOLR
50.0000 mg | Freq: Four times a day (QID) | INTRAMUSCULAR | Status: DC
  Administered 2012-10-29 – 2012-10-30 (×4): 50 mg via INTRAVENOUS
  Filled 2012-10-29 (×7): qty 1

## 2012-10-29 MED ORDER — POTASSIUM CHLORIDE 10 MEQ/100ML IV SOLN
10.0000 meq | INTRAVENOUS | Status: AC
  Administered 2012-10-29 (×4): 10 meq via INTRAVENOUS
  Filled 2012-10-29 (×4): qty 100

## 2012-10-29 MED ORDER — PIPERACILLIN-TAZOBACTAM 3.375 G IVPB
3.3750 g | Freq: Four times a day (QID) | INTRAVENOUS | Status: DC
  Filled 2012-10-29 (×3): qty 50

## 2012-10-29 MED ORDER — FENTANYL CITRATE 0.05 MG/ML IJ SOLN
25.0000 ug | INTRAMUSCULAR | Status: DC | PRN
  Administered 2012-10-30: 25 ug via INTRAVENOUS
  Filled 2012-10-29: qty 2

## 2012-10-29 MED ORDER — DEXTROSE 50 % IV SOLN: 25.0000 mL | Freq: Once | INTRAVENOUS | Status: DC | PRN

## 2012-10-29 MED ORDER — SODIUM CHLORIDE 0.9 % IV SOLN
1.0000 mg/h | INTRAVENOUS | Status: DC
  Administered 2012-10-29 (×2): 2 mg/h via INTRAVENOUS
  Filled 2012-10-29 (×2): qty 10

## 2012-10-29 MED ORDER — ETOMIDATE 2 MG/ML IV SOLN
INTRAVENOUS | Status: AC | PRN
  Administered 2012-10-29: 20 mg via INTRAVENOUS

## 2012-10-29 MED ORDER — VANCOMYCIN HCL IN DEXTROSE 1-5 GM/200ML-% IV SOLN
1000.0000 mg | Freq: Two times a day (BID) | INTRAVENOUS | Status: DC
  Filled 2012-10-29: qty 200

## 2012-10-29 MED ORDER — BIOTENE DRY MOUTH MT LIQD
15.0000 mL | Freq: Four times a day (QID) | OROMUCOSAL | Status: DC
  Administered 2012-10-30 – 2012-10-31 (×5): 15 mL via OROMUCOSAL

## 2012-10-29 MED ORDER — OSMOLITE 1.2 CAL PO LIQD
1000.0000 mL | ORAL | Status: DC
  Administered 2012-10-29: 1000 mL
  Filled 2012-10-29 (×2): qty 1000

## 2012-10-29 MED ORDER — POTASSIUM CHLORIDE 20 MEQ/15ML (10%) PO LIQD
40.0000 meq | Freq: Once | ORAL | Status: AC
  Administered 2012-10-29: 40 meq
  Filled 2012-10-29: qty 30

## 2012-10-29 MED ORDER — ADULT MULTIVITAMIN LIQUID CH
5.0000 mL | Freq: Every day | ORAL | Status: DC
  Administered 2012-10-29: 5 mL
  Filled 2012-10-29 (×2): qty 5

## 2012-10-29 MED ORDER — FENTANYL CITRATE 0.05 MG/ML IJ SOLN
25.0000 ug/h | INTRAMUSCULAR | Status: DC
  Administered 2012-10-29: 50 ug/h via INTRAVENOUS
  Filled 2012-10-29: qty 50

## 2012-10-29 MED ORDER — PRO-STAT SUGAR FREE PO LIQD
30.0000 mL | Freq: Four times a day (QID) | ORAL | Status: DC
  Administered 2012-10-29: 30 mL
  Filled 2012-10-29 (×6): qty 30

## 2012-10-29 MED ORDER — PANTOPRAZOLE SODIUM 40 MG IV SOLR
40.0000 mg | INTRAVENOUS | Status: DC
  Administered 2012-10-29 – 2012-10-31 (×3): 40 mg via INTRAVENOUS
  Filled 2012-10-29 (×3): qty 40

## 2012-10-29 MED ORDER — MIDAZOLAM HCL 2 MG/2ML IJ SOLN: 1.0000 mg | INTRAMUSCULAR | Status: DC | PRN

## 2012-10-29 MED ORDER — PIPERACILLIN-TAZOBACTAM 3.375 G IVPB
3.3750 g | Freq: Three times a day (TID) | INTRAVENOUS | Status: DC
  Administered 2012-10-29 – 2012-10-30 (×3): 3.375 g via INTRAVENOUS
  Filled 2012-10-29 (×5): qty 50

## 2012-10-29 MED ORDER — HEPARIN SODIUM (PORCINE) 5000 UNIT/ML IJ SOLN
5000.0000 [IU] | Freq: Three times a day (TID) | INTRAMUSCULAR | Status: AC
  Administered 2012-10-29 – 2012-11-02 (×14): 5000 [IU] via SUBCUTANEOUS
  Filled 2012-10-29 (×14): qty 1

## 2012-10-29 MED ORDER — SODIUM POLYSTYRENE SULFONATE 15 GM/60ML PO SUSP: 15.0000 g | Freq: Once | ORAL | Status: DC

## 2012-10-29 MED ORDER — VANCOMYCIN HCL 1000 MG IV SOLR
750.0000 mg | INTRAVENOUS | Status: DC
  Administered 2012-10-29: 750 mg via INTRAVENOUS
  Filled 2012-10-29 (×2): qty 750

## 2012-10-29 NOTE — ED Notes (Signed)
Started NS @ 157ml/h with hotline per EDP order, Bair hugger continues.  Pt reacted when applying NS to his eyes (due to them appearing dry), pt also moved both arms and hands during and after Foley placement.  Pt did not appear in any distress.  Fentanyl and versed drips started after this.  Temp foley (regular type) was inserted by EDP.  Continued care by RN and NT at the bedside.  Also c-collar changed to [place] by RN with NT holding C-spine (per EDP order)

## 2012-10-29 NOTE — ED Provider Notes (Addendum)
History     CSN: [id number]  Arrival date & time 10/29/12  1024   First MD Initiated Contact with Patient 10/29/12 1030      Chief Complaint  Patient presents with  . Cold Exposure    (Consider location/radiation/quality/duration/timing/severity/associated sxs/prior treatment) HPI Comments: Miguel Swanson was found laying on the ground next to his front stairs.  He was last seen normal yesterday evening and it is unknown how long he was down on the ground.  EMS reports him being completely dressed and state he is breathing on his own with faint carotid pulses.  The history is provided by the EMS personnel and a relative.    No past medical history on file.  No past surgical history on file.  No family history on file.  History  Substance Use Topics  . Smoking status: Not on file  . Smokeless tobacco: Not on file  . Alcohol Use: Not on file      Review of Systems  Unable to perform ROS   Allergies  Review of patient's allergies indicates not on file.  Home Medications  No current outpatient prescriptions on file.  BP 110/74  Pulse 41  Resp 18  SpO2 100%  Physical Exam  Nursing note and vitals reviewed. Constitutional: He appears cachectic. He appears toxic. He has a sickly appearance. He appears ill. He is not intubated. Cervical collar, backboard and face mask in place.  HENT:  Head: Normocephalic. Head is with abrasion (occipital) and with contusion (occipital). Head is without raccoon's eyes, without laceration, without right periorbital erythema and without left periorbital erythema.    Right Ear: External ear normal. No hemotympanum.  Left Ear: External ear normal. No hemotympanum.  Nose: Nose normal. No rhinorrhea or nasal septal hematoma. No epistaxis.  Mouth/Throat: Uvula is midline. Mucous membranes are pale, dry and cyanotic. He has dentures. Abnormal dentition. No dental abscesses, uvula swelling or dental caries. No oropharyngeal exudate, posterior  oropharyngeal edema, posterior oropharyngeal erythema or tonsillar abscesses.  Eyes: Lids are normal. Right eye exhibits no discharge and no exudate. Left eye exhibits no discharge and no exudate. Right conjunctiva is not injected. Right conjunctiva has no hemorrhage. Left conjunctiva is not injected. Left conjunctiva has no hemorrhage. Right eye exhibits no nystagmus. Left eye exhibits no nystagmus.       Pupils pinpoint and sluggish.  Neck: Trachea normal. No JVD present. No tracheal tenderness present. Carotid bruit is not present. No tracheal deviation present.  Cardiovascular: Bradycardia present.  PMI is not displaced.  Exam reveals distant heart sounds and decreased pulses.   Murmur heard. Pulmonary/Chest: No accessory muscle usage or stridor. Apnea and bradypnea noted. Not tachypneic. He is not intubated. No respiratory distress. He has decreased breath sounds in the right upper field, the right middle field, the right lower field, the left upper field, the left middle field and the left lower field.  Abdominal: Distention: scaphoid abdomen. Bowel sounds are absent. There is no tenderness. There is no rigidity, no rebound, no guarding, no CVA tenderness, no tenderness at McBurney's point and negative Murphy's sign. No hernia. Hernia confirmed negative in the right inguinal area and confirmed negative in the left inguinal area.  Genitourinary: Penis normal. Circumcised. No penile erythema. No discharge found.  Musculoskeletal: Normal range of motion. He exhibits no edema and no tenderness.       All extremities are cold, pallid, cyanotic, unable to appreciate peripheral pulses in arms/feet.  Lymphadenopathy:       Right:  No inguinal adenopathy present.       Left: No inguinal adenopathy present.  Neurological: He is unresponsive. He displays tremor. He displays no atrophy. He exhibits abnormal muscle tone. He displays no seizure activity. GCS eye subscore is 4. GCS verbal subscore is 1. GCS  motor subscore is 1. He displays no Babinski's sign on the right side. He displays no Babinski's sign on the left side.       Pt hyporeflexive.  Note no facial asymmetry.  Skin: Skin is dry. No rash noted. No erythema. There is pallor.    ED Course  INTUBATION Date/Time: 10/29/2012 11:29 AM Performed by: Dana Allan T Authorized by: Dana Allan T Consent: Verbal consent not obtained. The procedure was performed in an emergent situation. Time out: Immediately prior to procedure a "time out" was called to verify the correct patient, procedure, equipment, support staff and site/side marked as required. Indications: respiratory failure and airway protection Intubation method: video-assisted Patient status: paralyzed (RSI) Sedatives: etomidate Paralytic: succinylcholine Tube size: 8.0 mm Tube type: cuffed Number of attempts: 1 Ventilation between attempts: BVM Cricoid pressure: no Cords visualized: yes Post-procedure assessment: chest rise and CO2 detector Breath sounds: equal Cuff inflated: yes ETT to lip: 22 cm ETT to teeth: 24 cm Tube secured with: ETT holder Chest x-ray interpreted by radiologist. Chest x-ray findings: endotracheal tube in appropriate position Patient tolerance: Patient tolerated the procedure well with no immediate complications.   (including critical care time)  Labs Reviewed  CBC WITH DIFFERENTIAL - Abnormal; Notable for the following:    WBC 19.7 (*)     HCT 37.7 (*)     Neutrophils Relative 82 (*)     Neutro Abs 16.1 (*)     Lymphocytes Relative 11 (*)     Monocytes Absolute 1.3 (*)     All other components within normal limits  COMPREHENSIVE METABOLIC PANEL - Abnormal; Notable for the following:    Chloride 91 (*)     Glucose, Bld 119 (*)     BUN 48 (*)     Creatinine, Ser 2.07 (*)     Albumin 3.4 (*)     AST 49 (*)     Alkaline Phosphatase 137 (*)     GFR calc non Af Amer 28 (*)     GFR calc Af Amer 32 (*)     All other components  within normal limits  PROTIME-INR - Abnormal; Notable for the following:    Prothrombin Time 17.0 (*)     All other components within normal limits  LACTIC ACID, PLASMA - Abnormal; Notable for the following:    Lactic Acid, Venous 5.7 (*)     All other components within normal limits  CK TOTAL AND CKMB - Abnormal; Notable for the following:    Total CK 660 (*)     CK, MB 9.9 (*)     All other components within normal limits  APTT   Dg Pelvis Portable  10/29/2012  *RADIOLOGY REPORT*  Clinical Data: Found unresponsive earlier this morning outside. Hypothermia.  PORTABLE PELVIS AP VIEW 10/29/2012 1032 hours:  Comparison: None.  Findings: No acute fractures involving the pelvis or either proximal femur.  Symmetric mild axial joint space narrowing in both hips.  Sacroiliac joints and symphysis pubis intact.  Degenerative changes involving the visualized lower lumbar spine.  IMPRESSION: No acute or significant osseous abnormality for age.   Original Report Authenticated By: Hulan Saas, M.D.    Dg Chest Portable 1 View  10/29/2012  *RADIOLOGY REPORT*  Clinical Data: Acute respiratory failure.  Hypothermia. Unresponsive.  PORTABLE CHEST - 1 VIEW  Comparison: None.  Findings: Endotracheal tube is seen with tip approximately 4 cm above carina.  Moderate cardiomegaly is seen.  Both lungs are clear.  No evidence of pneumothorax or pleural effusion.  Ectasia of the thoracic aorta is noted.  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  IMPRESSION: Moderate cardiomegaly.  No active lung disease.   Original Report Authenticated By: Myles Rosenthal, M.D.      No diagnosis found.   Date: 10/29/2012 @1038   Rate: 44 bpm  Rhythm: sinus bradycardia  QRS Axis: left  Intervals: QT prolonged  ST/T Wave abnormalities: nonspecific ST changes  Conduction Disutrbances:left bundle branch block  Narrative Interpretation:   Old EKG Reviewed: none available      MDM  Pt presents  after being found down outside (temp 19 degrees F).  He was cold, gray, ashen, with barely palpable pulse, fixed pupils, and poor respiratory effort.  Rectal temp 88 degrees F.  Secondary to altered mental status and poor respiratory effort, moved to perform RSI intubation. Performed primary and secondary surveys.  Only evidence of trauma is an abrasion over the occiput with some mild oozing.  Broad labs including cardiac labs, CXR, pelvic xray, CT head and cervical spine ordered.  Warming measures including warm saline infusion and warming blankets placed.    1130.  Unsure of the etiology of this event.  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  Family reports recent issues with SOB and 40 lb wt loss since last summer.  He has been treated for CHF with diuretics recently.  There was an overturned chair inside his home and groceries on the back porch.  He was found on the front step and his wallet and cell phone were in his pockets.  It is unclear how long he was outside but per family, he does not usually get up early in the morning or run morning errands.  He spoke to his son yesterday afternoon about going grocery shopping.    Note elevated CK, and CK-MB.  He also has some mild renal failure without hyperkalemia and a significant leukocytosis.  CXR demonstrates no infiltrate.  ETT is well placed.  1150.  Discussed his evaluation with the on-call intensivist.  A bedside evaluation will be performed. 1200.  I inserted foley catheter with temp probe.  Intensivist is at the bedside.  CRITICAL CARE Performed by: Dana Allan T   Total critical care time: 60   Critical care time was exclusive of separately billable procedures and treating other patients.  Critical care was necessary to treat or prevent imminent or life-threatening deterioration.  Critical care was time spent personally by me on the following activities: development of  treatment plan with patient and/or surrogate as well as nursing, discussions with consultants, evaluation of patient's response to treatment, examination of patient, obtaining history from patient or surrogate, ordering and performing treatments and interventions, ordering and review of laboratory studies, ordering and review of radiographic studies, pulse oximetry and re-evaluation of patient's condition.        Tobin Chad, MD 10/29/12 1153  Tobin Chad, MD 10/29/12 0102  Tobin Chad, MD 11/19/12 [phone]

## 2012-10-29 NOTE — Progress Notes (Signed)
10:10 Pt intubated by ED MD 8.0ETT, 25Lip good color change on ETCO2, BBS equal, abg and chest xray to follow

## 2012-10-29 NOTE — Progress Notes (Signed)
Met pt's family in lobby and escorted them to consult room. Stayed w/family as needed and ckd back w/family as needed to keep them posted. Family was updated by ed physician and nurse.  Marjory Lies Chaplain

## 2012-10-29 NOTE — Progress Notes (Signed)
INITIAL NUTRITION ASSESSMENT  DOCUMENTATION CODES Per approved criteria  -Not Applicable   INTERVENTION: 1. Initiate Osmolite 1.2 at 10 ml/hr, advance by 10 ml/hr every 4 hours, to goal of 30 ml/hr. 30 ml Prostat via tube QID. Total regimen will provide: 1164 kcal, 85 grams protein, 590 ml free water. 2. MVI daily 3. RD to continue to follow nutrition care plan  NUTRITION DIAGNOSIS: Inadequate oral intake related to inability to eat as evidenced by NPO status.   Goal: Pt to meet >/= 90% of their estimated nutrition needs.  Monitor:  weight trends, lab trends, I/O's, PO intake, supplement tolerance  Reason for Assessment: MD Consult for Initiation and Management of Enteral Nutrition  79 y.o. male  Admitting Dx: hypothermia  ASSESSMENT: Pt found outside with hypothermia. Placed on the vent on 1/25. Hyperkalemia on admission, given kayexalate. Potassium now low, phosphorus and magnesium are elevated.  Patient is currently intubated on ventilator support.  MV: 10.4 Temp: current temperature is 34.1  Height: Ht Readings from Last 1 Encounters:  10/29/12 5\' 9"  (1.753 m)    Weight: Wt Readings from Last 1 Encounters:  10/29/12 154 lb 5.2 oz (70 kg)    Ideal Body Weight: 160 lb  % Ideal Body Weight: 96%  Wt Readings from Last 10 Encounters:  10/29/12 154 lb 5.2 oz (70 kg)    Usual Body Weight: unable to obtain  % Usual Body Weight: n/a  BMI:  Body mass index is 22.79 kg/(m^2). Weight is WNL.  Estimated Nutritional Needs: Kcal: 1130 Protein: 85 - 100 grams Fluid: 1.8 - 2 liters daily  Skin: nothing documented at this time  Diet Order:    EDUCATION NEEDS: -No education needs identified at this time  No intake or output data in the 24 hours ending 10/29/12 1501  Last BM: PTA  Labs:   Lab 10/29/12 1320 10/29/12 1159 10/29/12 1012  NA 136 133* 136  K 3.1* 5.2* 4.8  CL 93* 97 91*  CO2 22 -- 20  BUN 51* 67* 48*  CREATININE 2.09* 2.00* 2.07*  CALCIUM  8.8 -- 9.9  MG 2.8* -- --  PHOS 8.9* -- --  GLUCOSE 109* 102* 119*    CBG (last 3)   Basename 10/29/12 1403 10/29/12 1240  GLUCAP 85 97    Scheduled Meds:   . heparin subcutaneous  5,000 Units Subcutaneous Q8H  . hydrocortisone sodium succinate  50 mg Intravenous Q6H  . pantoprazole (PROTONIX) IV  40 mg Intravenous Q24H  . piperacillin-tazobactam (ZOSYN)  IV  3.375 g Intravenous Q8H  . vancomycin  750 mg Intravenous Q24H    Continuous Infusions:   . sodium chloride    . fentaNYL infusion INTRAVENOUS 50 mcg/hr (10/29/12 1236)  . midazolam (VERSED) infusion 5 mg/hr (10/29/12 1237)    History reviewed. No pertinent past medical history.  History reviewed. No pertinent past surgical history.  Jarold Motto MS, RD, LDN Pager: [phone] After-hours pager: [phone]

## 2012-10-29 NOTE — Progress Notes (Signed)
ANTIBIOTIC CONSULT NOTE - INITIAL  Pharmacy Consult for Vancomycin and Zosyn Indication: rule out sepsis  Allergies not on file  Patient Measurements: Height: 5\' 9"  (175.3 cm) Weight: 154 lb 5.2 oz (70 kg) IBW/kg (Calculated) : 70.7  Adjusted Body Weight:   Vital Signs: Temp src: Core (Comment) (01/25 1245) BP: 141/59 mmHg (01/25 1315) Pulse Rate: 66  (01/25 1410) Intake/Output from previous day:   Intake/Output from this shift:    Labs:  Basename 10/29/12 1320 10/29/12 1159 10/29/12 1012  WBC 22.3* -- 19.7*  HGB 12.4* 13.9 13.1  PLT 186 -- 227  LABCREA -- -- --  CREATININE 2.09* 2.00* 2.07*   Estimated Creatinine Clearance: 26 ml/min (by C-G formula based on Cr of 2.09). No results found for this basename: VANCOTROUGH:2,VANCOPEAK:2,VANCORANDOM:2,GENTTROUGH:2,GENTPEAK:2,GENTRANDOM:2,TOBRATROUGH:2,TOBRAPEAK:2,TOBRARND:2,AMIKACINPEAK:2,AMIKACINTROU:2,AMIKACIN:2, in the last 72 hours   Microbiology: No results found for this or any previous visit (from the past 720 hour(s)).  Medical History: History reviewed. No pertinent past medical history.  Medications:  Scheduled:    . heparin subcutaneous  5,000 Units Subcutaneous Q8H  . hydrocortisone sodium succinate  50 mg Intravenous Q6H  . pantoprazole (PROTONIX) IV  40 mg Intravenous Q24H  . piperacillin-tazobactam (ZOSYN)  IV  3.375 g Intravenous Q8H  . [DISCONTINUED] piperacillin-tazobactam (ZOSYN)  IV  3.375 g Intravenous Q6H  . [DISCONTINUED] sodium polystyrene  15 g Oral Once  . [DISCONTINUED] vancomycin  1,000 mg Intravenous Q12H   Assessment: 79yo male found down and hypothermic, to start Vanc and Zosyn for r/o sepsis.  No antibiotics given in ED.  Cr is 2.09, ~CrCl 25.  Blood and urine cultures have been drawn.  Goal of Therapy:  Vancomycin trough level 15-20 mcg/ml  Plan:  1.  Vancomycin 750mg  IV q24 2.  Zosyn 3.375gm IV q8, infuse over 4 hours 3.  Follow renal function closesly 4.  F/U culture  results.  Marisue Humble, PharmD Clinical Pharmacist [facility]

## 2012-10-29 NOTE — ED Notes (Signed)
Per EMS, pt found outside of his home by his neighbor. Pt hypothermic and altered LOC. GCS 3. IV 22g warm fluids started

## 2012-10-29 NOTE — ED Notes (Signed)
Attempted a 69fr temperature foley.  Unable to advance catheter greater than an inch.

## 2012-10-29 NOTE — ED Notes (Signed)
Abnormal labs given to RN Traci

## 2012-10-29 NOTE — Procedures (Addendum)
Arterial Catheter Insertion Procedure Note KINAN SAFLEY [id number] 1926-10-02  Procedure: Insertion of Arterial Catheter  Indications: Blood pressure monitoring  Procedure Details Consent: Unable to obtain consent because of altered level of consciousness. Time Out: Verified patient identification, verified procedure, site/side was marked, verified correct patient position, special equipment/implants available, medications/allergies/relevent history reviewed, required imaging and test results available.  Performed  Maximum sterile technique was used including antiseptics, cap, gloves, gown, hand hygiene, mask and sheet. Skin prep: Chlorhexidine; local anesthetic administered 20 gauge catheter was inserted into right radial artery using the Seldinger technique.  Evaluation Blood flow good; BP tracing good. Complications: No apparent complications.   Koren Bound 10/29/2012

## 2012-10-29 NOTE — Progress Notes (Signed)
Called to elink received order to bolus patient 1L NS done. CVP set up 5

## 2012-10-29 NOTE — Procedures (Signed)
Central Venous Catheter Insertion Procedure Note Miguel Swanson [id number] [birth year]  Procedure: Insertion of Central Venous Catheter Indications: Drug and/or fluid administration  Procedure Details Consent: Unable to obtain consent because of altered level of consciousness. Time Out: Verified patient identification, verified procedure, site/side was marked, verified correct patient position, special equipment/implants available, medications/allergies/relevent history reviewed, required imaging and test results available.  Performed  Maximum sterile technique was used including antiseptics, cap, gloves, gown, hand hygiene, mask and sheet. Skin prep: Chlorhexidine; local anesthetic administered A antimicrobial bonded/coated triple lumen catheter was placed in the left subclavian vein using the Seldinger technique.  Evaluation Blood flow good Complications: No apparent complications Patient did tolerate procedure well. Chest X-ray ordered to verify placement.  CXR: pending.  U/S used in placement.  YACOUB,WESAM 10/29/2012, 12:51 PM

## 2012-10-29 NOTE — ED Notes (Signed)
Family at bedside. 

## 2012-10-29 NOTE — H&P (Signed)
PULMONARY  / CRITICAL CARE MEDICINE  Name: Miguel Swanson MRN: [id number] DOB: 1926/07/03    ADMISSION DATE:  10/29/2012 CONSULTATION DATE:  10/29/12  REFERRING MD :  EDP  CHIEF COMPLAINT:  LOC and VDRF  BRIEF PATIENT DESCRIPTION: 79 year old male with unknown PMH, found down by neighbor outside.  EMS was called and patient was brought to the ED and patient was intubated.  Was found to be hypothermic.  PCCM was called to admit after patient was intubated.  No other history is available, patient is unconscious and no family is available.  SIGNIFICANT EVENTS / STUDIES:  1/25>>>Found outside, hypothermic  LINES / TUBES: L [place] TLC 1/25>>> L radial a-line 1/25>>> ET tube 1/25>>>  CULTURES: Blood 1/25>>> Urine 1/25>>> Sputum 1/25>>>  ANTIBIOTICS: Vanc 1/25>>> Zosyn 1/25>>>  PAST MEDICAL HISTORY :  No past medical history on file. No past surgical history on file. Prior to Admission medications   Not on File   Allergies not on file  FAMILY HISTORY:  No family history on file. SOCIAL HISTORY:  does not have a smoking history on file. He does not have any smokeless tobacco history on file. His alcohol and drug histories not on file.  REVIEW OF SYSTEMS:  Unattainable, patient is sedated and intubated.  SUBJECTIVE:   VITAL SIGNS: Pulse Rate:  [41-55] 45  (01/25 1045) Resp:  [18-22] 22  (01/25 1136) BP: (110-138)/(50-79) 138/79 mmHg (01/25 1136) SpO2:  [100 %] 100 % (01/25 1136) FiO2 (%):  [100 %] 100 % (01/25 1136) HEMODYNAMICS:   VENTILATOR SETTINGS: Vent Mode:  [-] PRVC FiO2 (%):  [100 %] 100 % Set Rate:  [22 bmp] 22 bmp Vt Set:  [570 mL] 570 mL PEEP:  [5 cmH20] 5 cmH20 INTAKE / OUTPUT: Intake/Output    None     PHYSICAL EXAMINATION: General:  Chronically ill appearing male, sedated and intubated, moving all ext to noxious stimuli. Neuro:  Sedated and intubated. HEENT:  [place]/AT, PERRL, EOM-spontaneous and DMM.  -JVD. Cardiovascular:  RRR, Nl S1/S2,  -M/R/G. Lungs:  Coarse BS diffusely. Abdomen:  Soft, NT, ND and +BS. Musculoskeletal:  -edema and -tenderness. Skin:  Blue extremities.  LABS:  Lab 10/29/12 1159 10/29/12 1012 10/29/12 0932  HGB 13.9 13.1 --  WBC -- 19.7* --  PLT -- 227 --  NA 133* 136 --  K 5.2* 4.8 --  CL 97 91* --  CO2 -- 20 --  GLUCOSE 102* 119* --  BUN 67* 48* --  CREATININE 2.00* 2.07* --  CALCIUM -- 9.9 --  MG -- -- --  PHOS -- -- --  AST -- 49* --  ALT -- 34 --  ALKPHOS -- 137* --  BILITOT -- 0.9 --  PROT -- 7.9 --  ALBUMIN -- 3.4* --  APTT -- 31 --  INR -- 1.42 --  LATICACIDVEN -- -- 5.7*  TROPONINI -- -- --  PROCALCITON -- -- --  PROBNP -- -- --  O2SATVEN -- -- --  PHART -- -- --  PCO2ART -- -- --  PO2ART -- -- --   No results found for this basename: GLUCAP:5 in the last 168 hours  CXR: ET tube ok, mild pulmonary edema.  ASSESSMENT / PLAN:  PULMONARY A: VDRF due to mental status compromise after being down in the cold. P:   Full vent support. F/U ABG and CXR. Daily WUA/SBT. Abx for ?aspiration.  CARDIOVASCULAR A: Hypotension and bradycardia due to hypothermia. P:  Check cortisol level and give stress  dose steroids. Place TLC. Pressors as needed. ?sepsis.  RENAL A:  Acute pre-renal renal failure and hyperkalemia. P:   K will increase further once rewarmed. Kayexalate. F/U BMET at 5 PM. BMET in AM.  GASTROINTESTINAL A:  No active issues. P:   Place OGT and start TF.  HEMATOLOGIC A:  Leukocytosis. P:  Likely stress response. See ID section.  INFECTIOUS A:  ?aspiration. P:   Pan culture. Vanc/zosyn.  ENDOCRINE A:  ?DM.   P:   ISS.  NEUROLOGIC A:  Found down with a  Negative head CT. P:   UDS. No focal findings, will monitor for now. Intermittent sedation.  I have personally obtained a history, examined the patient, evaluated laboratory and imaging results, formulated the assessment and plan and placed orders.  CRITICAL CARE: The patient is  critically ill with multiple organ systems failure and requires high complexity decision making for assessment and support, frequent evaluation and titration of therapies, application of advanced monitoring technologies and extensive interpretation of multiple databases. Critical Care Time devoted to patient care services described in this note is 45 minutes.   Alyson Reedy, M.D. Pulmonary and Critical Care Medicine [facility] Pager: [phone]  10/29/2012, 12:06 PM

## 2012-10-30 ENCOUNTER — Encounter (HOSPITAL_COMMUNITY): Payer: Self-pay | Admitting: Cardiology

## 2012-10-30 ENCOUNTER — Inpatient Hospital Stay (HOSPITAL_COMMUNITY): Payer: Medicare Other

## 2012-10-30 DIAGNOSIS — T68XXXA Hypothermia, initial encounter: Secondary | ICD-10-CM | POA: Diagnosis present

## 2012-10-30 DIAGNOSIS — Z951 Presence of aortocoronary bypass graft: Secondary | ICD-10-CM

## 2012-10-30 DIAGNOSIS — E785 Hyperlipidemia, unspecified: Secondary | ICD-10-CM

## 2012-10-30 DIAGNOSIS — R55 Syncope and collapse: Secondary | ICD-10-CM

## 2012-10-30 LAB — BASIC METABOLIC PANEL
CO2: 23 mEq/L (ref 19–32)
Calcium: 8.3 mg/dL — ABNORMAL LOW (ref 8.4–10.5)
Chloride: 99 mEq/L (ref 96–112)
Glucose, Bld: 139 mg/dL — ABNORMAL HIGH (ref 70–99)
Sodium: 137 mEq/L (ref 135–145)

## 2012-10-30 LAB — CK TOTAL AND CKMB (NOT AT ARMC)
CK, MB: 8.8 ng/mL (ref 0.3–4.0)
Relative Index: 0.6 (ref 0.0–2.5)

## 2012-10-30 LAB — PHOSPHORUS: Phosphorus: 6.5 mg/dL — ABNORMAL HIGH (ref 2.3–4.6)

## 2012-10-30 LAB — CBC
Hemoglobin: 12.4 g/dL — ABNORMAL LOW (ref 13.0–17.0)
MCH: 30.8 pg (ref 26.0–34.0)
MCV: 88.3 fL (ref 78.0–100.0)
RBC: 4.03 MIL/uL — ABNORMAL LOW (ref 4.22–5.81)

## 2012-10-30 MED ORDER — ALPRAZOLAM 0.5 MG PO TABS
0.5000 mg | ORAL_TABLET | Freq: Once | ORAL | Status: AC
  Administered 2012-10-31: 0.5 mg via ORAL
  Filled 2012-10-30: qty 1

## 2012-10-30 MED ORDER — LEVOFLOXACIN IN D5W 750 MG/150ML IV SOLN
750.0000 mg | INTRAVENOUS | Status: DC
  Administered 2012-10-30 – 2012-11-01 (×2): 750 mg via INTRAVENOUS
  Filled 2012-10-30 (×2): qty 150

## 2012-10-30 MED ORDER — POTASSIUM CHLORIDE CRYS ER 20 MEQ PO TBCR
40.0000 meq | EXTENDED_RELEASE_TABLET | Freq: Once | ORAL | Status: AC
  Administered 2012-10-30: 40 meq via ORAL
  Filled 2012-10-30: qty 2

## 2012-10-30 MED ORDER — ADULT MULTIVITAMIN W/MINERALS CH
1.0000 | ORAL_TABLET | Freq: Every day | ORAL | Status: DC
  Administered 2012-10-30 – 2012-11-05 (×6): 1 via ORAL
  Filled 2012-10-30 (×7): qty 1

## 2012-10-30 NOTE — Progress Notes (Signed)
ANTIBIOTIC CONSULT NOTE - FOLLOW UP  Pharmacy Consult for Levaquin Indication: Empiric PNA, r/o sepsis  No Known Allergies  Patient Measurements: Height: 5\' 9"  (175.3 cm) Weight: 160 lb 0.9 oz (72.6 kg) IBW/kg (Calculated) : 70.7   Vital Signs: Temp: 99.9 F (37.7 C) (01/26 1200) Temp src: Core (Comment) (01/26 0800) BP: 108/59 mmHg (01/26 1200) Pulse Rate: 79  (01/26 1200) Intake/Output from previous day: 01/25 0701 - 01/26 0700 In: 2335.8 [I.V.:1100.8; NG/GT:560; IV Piggyback:675] Out: 1390 [Urine:1390] Intake/Output from this shift: Total I/O In: 485 [I.V.:460; IV Piggyback:25] Out: 550 [Urine:550]  Labs:  [facility] 10/30/12 0418 10/29/12 1606 10/29/12 1320 10/29/12 1159 10/29/12 1012  WBC 15.7* -- 22.3* -- 19.7*  HGB 12.4* -- 12.4* 13.9 --  PLT 154 -- 186 -- 227  LABCREA -- -- -- -- --  CREATININE 2.03* 2.01* 2.09* -- --   Estimated Creatinine Clearance: 27.1 ml/min (by C-G formula based on Cr of 2.03). No results found for this basename: VANCOTROUGH:2,VANCOPEAK:2,VANCORANDOM:2,GENTTROUGH:2,GENTPEAK:2,GENTRANDOM:2,TOBRATROUGH:2,TOBRAPEAK:2,TOBRARND:2,AMIKACINPEAK:2,AMIKACINTROU:2,AMIKACIN:2, in the last 72 hours   Microbiology: Recent Results (from the past 720 hour(s))  CULTURE, RESPIRATORY     Status: Normal (Preliminary result)   Collection Time   10/29/12  3:40 PM      Component Value Range Status Comment   Specimen Description TRACHEAL ASPIRATE   Final    Special Requests NONE   Final    Gram Stain PENDING   Incomplete    Culture MODERATE GRAM NEGATIVE RODS   Final    Report Status PENDING   Incomplete   MRSA PCR SCREENING     Status: Normal   Collection Time   10/29/12  5:41 PM      Component Value Range Status Comment   MRSA by PCR NEGATIVE  NEGATIVE Final     Anti-infectives     Start     Dose/Rate Route Frequency Ordered Stop   10/30/12 1400   levofloxacin (LEVAQUIN) IVPB 750 mg        750 mg 100 mL/hr over 90 Minutes Intravenous Every 48 hours  10/30/12 1015     10/29/12 1530   vancomycin (VANCOCIN) 750 mg in sodium chloride 0.9 % 150 mL IVPB  Status:  Discontinued        750 mg 150 mL/hr over 60 Minutes Intravenous Every 24 hours 10/29/12 1447 10/30/12 1009   10/29/12 1500   piperacillin-tazobactam (ZOSYN) IVPB 3.375 g  Status:  Discontinued        3.375 g 12.5 mL/hr over 240 Minutes Intravenous 3 times per day 10/29/12 1424 10/30/12 1009   10/29/12 1245   vancomycin (VANCOCIN) IVPB 1000 mg/200 mL premix  Status:  Discontinued        1,000 mg 200 mL/hr over 60 Minutes Intravenous Every 12 hours 10/29/12 1250 10/29/12 1425   10/29/12 1245   piperacillin-tazobactam (ZOSYN) IVPB 3.375 g  Status:  Discontinued        3.375 g 12.5 mL/hr over 240 Minutes Intravenous 4 times per day 10/29/12 1250 10/29/12 1424          Assessment: 79 y.o M found down by a neighbor on 10/29/12. The patient was found to be hypothermic and required intubation. The patient has been extubated this morning and pharmacy was consulted to de-escalate from Vanc/Zosyn to Levaquin monotherapy for continued PNA/sepsis coverage.  Of noting, the patient's respiratory cultures are growing moderate gram negative rods (pending speciation). Double coverage was discussed with Dr. Molli Knock -- but he feels Levaquin monotherapy is sufficient at this time.  Levaquin 1/26 >> Vanc 1/25 >> 1/26  Zosyn 1/25 >>1/26   1/25 RCx >> moderate GNR (pending speciation) 1/25 UCx >> 1/25 BCx >>  Goal of Therapy:  Proper antibiotics for infection/cultures adjusted for renal/hepatic function   Plan:  1. Levaquin 750 mg IV every 48 hours 2. Will continue to follow renal function, culture results, LOT, and antibiotic de-escalation plans   Georgina Pillion, PharmD, BCPS Clinical Pharmacist Pager: [phone] 10/30/2012 1:06 PM

## 2012-10-30 NOTE — Consult Note (Signed)
CARDIOLOGY CONSULT NOTE  Patient ID: Miguel Swanson MRN: [id number] DOB/AGE: 1926-09-15 79 y.o.  Admit date: 10/29/2012 Referring Physician  PCCM Primary Physician:  Sanda Linger, MD Reason for Consultation  NSTEMI  HPI: Patient is a very active 79 year old Caucasian male with history of known coronary artery disease, who lives independently was doing well until day before he had gone to the store around 9:15 AM came home, and found himself to have been locked out.  He clearly remembers going to the back yard and coming back to the front, usually enters the house to the back, try to open the front door and she felt it was frozen and was trying to go back to the back door and does not recollect much of anything since then.  24 hour later, next-door neighbor had noticed that he is on the ground and activated the EMS.  Historically the events where reconstructed after patient's son found and received from a local grocery which is right across the house. Patient has been having shortness of breath and dyspnea on exertion for the last 4-5 months and was scheduled to see a cardiologist, appears to be [facility] cardiology which I also offered them that I'll be happy to call, but I was called upon to see the patient due to abnormal CPK, troponin. Patient complains of pain in his left arm and left hand after the fall.  He has mild dyspnea which is chronic denies any PND or orthopnea prior to the doesn't patient to the hospital, denies any chest pain or chest discomfort.  Is presently laying in bed comfortably and his son is present at the bedside.  Past Medical History  Diagnosis Date  . Hx of CABG   . CAD (coronary artery disease)   . Hyperlipidemia   . H/O abdominal aortic aneurysm repair     Family history:  No history of premature CAD or DM in family.   Social History: History   Social History  . Marital Status: Widowed    Spouse Name: N/A    Number of Children: N/A  . Years of Education: N/A     Occupational History  . Not on file.   Social History Main Topics  . Smoking status: Never Smoker   . Smokeless tobacco: Not on file  . Alcohol Use: No  . Drug Use:   . Sexually Active:    Other Topics Concern  . Not on file   Social History Narrative  . No narrative on file     Prescriptions prior to admission  Medication Sig Dispense Refill  . ALPRAZolam (XANAX) 0.5 MG tablet Take 0.5 mg by mouth 3 (three) times daily.      Marland Kitchen aspirin EC 81 MG tablet Take 81 mg by mouth daily.      Marland Kitchen atorvastatin (LIPITOR) 40 MG tablet Take 40 mg by mouth every evening.      . hydrochlorothiazide (HYDRODIURIL) 25 MG tablet Take 25 mg by mouth daily.      . Omega-3 Fatty Acids (FISH OIL) 1200 MG CAPS Take 1 capsule by mouth daily.      Marland Kitchen omeprazole (PRILOSEC) 20 MG capsule Take 20 mg by mouth daily.      . polyethylene glycol (MIRALAX / GLYCOLAX) packet Take 17 g by mouth daily.      . ranitidine (ZANTAC) 150 MG tablet Take 150 mg by mouth 2 (two) times daily.      Marland Kitchen rOPINIRole (REQUIP) 0.25 MG tablet Take 0.25 mg by  mouth at bedtime.      . rosuvastatin (CRESTOR) 20 MG tablet Take 20 mg by mouth at bedtime.      . traMADol (ULTRAM) 50 MG tablet Take 50 mg by mouth every 8 (eight) hours as needed. For pain        Scheduled Meds:   . antiseptic oral rinse  15 mL Mouth Rinse QID  . chlorhexidine  15 mL Mouth Rinse BID  . heparin subcutaneous  5,000 Units Subcutaneous Q8H  . levofloxacin (LEVAQUIN) IV  750 mg Intravenous Q48H  . multivitamin with minerals  1 tablet Oral Daily  . pantoprazole (PROTONIX) IV  40 mg Intravenous Q24H   Continuous Infusions:   . sodium chloride 100 mL/hr at 10/29/12 1645   PRN Meds:.fentaNYL  ROS: General: no fevers/chills/night sweats Eyes: no blurry vision, diplopia, or amaurosis ENT: no sore throat or mild hearing loss Resp: no cough, wheezing, or hemoptysis CV: no edema or palpitations GI: no abdominal pain, nausea, vomiting, diarrhea, or  constipation GU: no dysuria, frequency, or hematuria Skin: no rash Neuro: no headache, numbness, tingling, or weakness of extremities Musculoskeletal: Left hand pain since fall. Has generalized arthritis.  Heme: no bleeding, DVT, or easy bruising Endo: no polydipsia or polyuria    Physical Exam: Blood pressure 108/56, pulse 81, temperature 99.3 F (37.4 C), temperature source Core (Comment), resp. rate 21, height 5\' 9"  (1.753 m), weight 72.6 kg (160 lb 0.9 oz), SpO2 100.00%.   General appearance: alert, cooperative, appears stated age and no distress Lungs: clear to auscultation bilaterally Chest wall: no tenderness Heart: S1, S2 normal, no S3 or S4 and 3/6 systolic ejection mur, crescendo decrescendo  right sternal border. Abdomen: soft, non-tender; bowel sounds normal; no masses,  no organomegaly Extremities: Bulky hands and feet, left hand is edematous with blebs at the fingertips of second and third digits, no significant warmth or suggestion of acute tenderness with cellulitis.  Pulses well felt both hands. Pulses: 2+ and symmetric Carotid pulses appears to be normal, carotid bruit cannot be ruled out as it was extremely difficult to hear the sounds. Neurologic: Grossly normal  Labs:   Lab Results  Component Value Date   WBC 15.7* 10/30/2012   HGB 12.4* 10/30/2012   HCT 35.6* 10/30/2012   MCV 88.3 10/30/2012   PLT 154 10/30/2012    Lab 10/30/12 0418 10/29/12 1320  NA 137 --  K 3.6 --  CL 99 --  CO2 23 --  BUN 61* --  CREATININE 2.03* --  CALCIUM 8.3* --  PROT -- 7.0  BILITOT -- 1.1  ALKPHOS -- 127*  ALT -- 35  AST -- 65*  GLUCOSE 139* --   Lab Results  Component Value Date   CKTOTAL 1561* 10/30/2012   CKMB 8.8* 10/30/2012   TROPONINI 0.46* 10/30/2012     EKG: 10/30/2012 unchanged from previous tracings On 10/29/2012, Normal sinus rhythm, IVCD, incomplete left bundle  Branch block, LVH with repolarization abnormality cannot rule out lateral  ischemia.     Radiology: CT head and spine 10/29/12:  IMPRESSION:  1.  No acute intracranial abnormality. 2.  Moderate to severe generalized atrophy and moderate to severe chronic microvascular ischemic changes of the white matter.  IMPRESSION:  1.  No cervical spine fracture identified.  2.  Multilevel degenerative disc disease, spondylosis, and facet degenerative changes with multilevel foraminal stenoses as detailed above. 3.  Emphysematous changes in the visualized lung apices.  CXR 10/30/12:  IMPRESSION: Support apparatus satisfactory.  Improved  pulmonary edema, with only minimal interstitial edema persisting.  Suboptimal inspiration accounts for bibasilar atelectasis.  No new abnormalities.   Thoracic aortic stent graft noted.   ASSESSMENT AND PLAN:  1.  Syncope secondary to fall, patient was playing on the ground for 24 hours before being discovered, found to be in hypothalamic shock 2.  Probable sepsis with elevated white count, suspect left hand cellulitis 3.  Renal insufficiency, probably chronic, do not have baseline values. 4.  Shortness of breath and dyspnea on exertion probably due to chronic diastolic heart failure, progression of coronary artery disease cannot be excluded.  This has been ongoing for the past 3-4 months. 5.  Abnormal serum troponins, secondary elevation, non-ST elevation myocardial infarction due to subendocardial ischemia, acute on chronic diastolic heart failure. 6.  Coronary artery disease S/P CABG, details not known 7.  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  Recommendation: I do not suspect ongoing coronary ischemia.  Although the CPK is elevated, CK-MB index is normal for the numbers and I suspect no ongoing myocardial injury.  Given his advanced age, chronic renal failure, other acute comorbidities presently, would  continue supportive management for now and can consider outpatient medical therapy for CAD and probable outpatient stress testing at some point when he is more stable.  Once hemodynamics are stable, would recommend starting low-dose of beta blocker, continuing statin therapy, aspirin and possibly consider Plavix at discharge.  Agree with obtaining an echocardiogram to establish baseline LVEF.  Thank you for asking me to see the pleasant, fairly active 79 year old gentleman who lives independently.  Pamella Pert, MD 10/30/2012, 2:54 PM [facility] Pager: [phone] Office: [phone] If no answer Cell [phone]

## 2012-10-30 NOTE — Progress Notes (Signed)
  Echocardiogram 2D Echocardiogram has been performed.  Miguel Swanson Miguel Swanson 10/30/2012, 5:29 PM

## 2012-10-30 NOTE — Procedures (Signed)
Extubation Procedure Note  Patient Details:   Name: Miguel Swanson DOB: [birth year] MRN: [id number]   Airway Documentation:  Airway 8 mm (Active)  Secured at (cm) 25 cm 10/30/2012  7:36 AM  Measured From Lips 10/30/2012  7:36 AM  Secured Location Right 10/30/2012  7:36 AM  Secured By [employer] 10/30/2012  7:36 AM  Tube Holder Repositioned Yes 10/30/2012  7:36 AM  Cuff Pressure (cm H2O) 22 cm H2O 10/30/2012  7:36 AM  Site Condition Dry 10/29/2012  2:10 PM    Evaluation  O2 sats: stable throughout Complications: No apparent complications Patient did tolerate procedure well. Bilateral Breath Sounds: Diminished Suctioning: Airway Yes  Ok Anis, MA 10/30/2012, 9:27 AM

## 2012-10-30 NOTE — Progress Notes (Signed)
PULMONARY  / CRITICAL CARE MEDICINE RESIDENT NOTE  Name: Miguel Swanson MRN: [id number] DOB: [birth year]    ADMISSION DATE:  10/29/2012 CONSULTATION DATE:  10/29/12  REFERRING MD :  EDP  CHIEF COMPLAINT:  LOC and VDRF  BRIEF PATIENT DESCRIPTION: 79 year old male with unknown PMH, found down by neighbor outside.  EMS was called and patient was brought to the ED and patient was intubated.  Was found to be hypothermic.  PCCM was called to admit after patient was intubated.  No other history is available, patient is unconscious and no family is available.  SIGNIFICANT EVENTS / STUDIES:  1/25>>>Found outside, hypothermic, admitted to PCCM service, transferred to ICU  LINES / TUBES: L [place] TLC 1/25>>> L radial a-line 1/25>>> ET tube 1/25>>>  CULTURES: Blood 1/25>>> Urine 1/25>>> Sputum 1/25>>>  ANTIBIOTICS: Vanc 1/25>>>1/26 Zosyn 1/25>>>1/26 Levaquin 1/26>>>  SUBJECTIVE:  Awake and alert, intubated.  C-Collar in place, moving all four extremities.    VITAL SIGNS: Temp:  [87.3 F (30.7 C)-100.2 F (37.9 C)] 99.8 F (37.7 C) (01/26 0736) Pulse Rate:  [41-87] 87  (01/26 0736) Resp:  [13-25] 21  (01/26 0736) BP: (69-142)/(42-96) 133/61 mmHg (01/26 0736) SpO2:  [95 %-100 %] 100 % (01/26 0736) FiO2 (%):  [40 %-100 %] 40 % (01/26 0736) Weight:  [154 lb 5.2 oz (70 kg)-160 lb 0.9 oz (72.6 kg)] 160 lb 0.9 oz (72.6 kg) (01/26 0400) HEMODYNAMICS: CVP:  [4 mmHg-10 mmHg] 4 mmHg VENTILATOR SETTINGS: Vent Mode:  [-] CPAP FiO2 (%):  [40 %-100 %] 40 % Set Rate:  [16 bmp-22 bmp] 16 bmp Vt Set:  [570 mL] 570 mL PEEP:  [5 cmH20] 5 cmH20 Pressure Support:  [5 cmH20] 5 cmH20 Plateau Pressure:  [16 cmH20-17 cmH20] 16 cmH20 INTAKE / OUTPUT: Intake/Output      01/25 0701 - 01/26 0700 01/26 0701 - 01/27 0700   I.V. (mL/kg) 1100.8 (15.2)    NG/GT 560    IV Piggyback 675    Total Intake(mL/kg) 2335.8 (32.2)    Urine (mL/kg/hr) 1390 (0.8)    Total Output 1390    Net +945.8            PHYSICAL EXAMINATION: General:  Chronically ill appearing male, intubated, moving all ext Neuro:  Awake, alert, intubated. HEENT:  [place]/AT, PERRLA, EOMI, c-collar in place  -JVD. Cardiovascular:  RRR, Nl S1/S2, -M/R/G. Lungs:  Coarse BS diffusely. Abdomen:  Soft, NT, ND and +BS. Musculoskeletal:  -edema and -tenderness. Left hand cuts and bruises Skin:  warm  LABS:  Lab 10/30/12 0418 10/29/12 1606 10/29/12 1600 10/29/12 1547 10/29/12 1320 10/29/12 1159 10/29/12 1012 10/29/12 0932  HGB 12.4* -- -- -- 12.4* 13.9 -- --  WBC 15.7* -- -- -- 22.3* -- 19.7* --  PLT 154 -- -- -- 186 -- 227 --  NA 137 134* -- -- 136 -- -- --  K 3.6 2.9* -- -- -- -- -- --  CL 99 94* -- -- 93* -- -- --  CO2 23 22 -- -- 22 -- -- --  GLUCOSE 139* 95 -- -- 109* -- -- --  BUN 61* 55* -- -- 51* -- -- --  CREATININE 2.03* 2.01* -- -- 2.09* -- -- --  CALCIUM 8.3* 8.4 -- -- 8.8 -- -- --  MG 2.4 -- -- -- 2.8* -- -- --  PHOS 6.5* -- -- -- 8.9* -- -- --  AST -- -- -- -- 65* -- 49* --  ALT -- -- -- --  35 -- 34 --  ALKPHOS -- -- -- -- 127* -- 137* --  BILITOT -- -- -- -- 1.1 -- 0.9 --  PROT -- -- -- -- 7.0 -- 7.9 --  ALBUMIN -- -- -- -- 3.0* -- 3.4* --  APTT -- -- -- -- 33 -- 31 --  INR -- -- -- -- 1.47 -- 1.42 --  LATICACIDVEN -- -- -- -- 3.0* -- -- 5.7*  TROPONINI -- -- -- -- -- -- -- --  PROCALCITON -- -- -- -- -- -- -- --  PROBNP -- -- -- -- 21030.0* -- -- --  O2SATVEN -- -- -- -- -- -- -- --  PHART -- -- 7.395 7.463* -- -- -- --  PCO2ART -- -- 45.3* 34.3* -- -- -- --  PO2ART -- -- 459.0* 34.0* -- -- -- --    Lab 10/29/12 1403 10/29/12 1240  GLUCAP 85 97    CXR:  1/25 ET tube ok, mild pulmonary edema 1/26 Improved pulmonary edema  ASSESSMENT / PLAN:  PULMONARY A: 1) VDRF--due to mental status compromise after being down in the cold 2) ?Aspiration P:   - Wean to extubate today. - F/U ABG and CXR. - Daily WUA/SBT. - See ID.  CARDIOVASCULAR A: 1) Hypotension and bradycardia--likely 2/2 to  hypothermia. 2) ?Sepsis--Off pressors P:  Cortisol level--40.7, on solucortef 50mg  q6h D/C solucortef. Echo, EKG, carotid dopplers and ?orthostasis for syncope. Troponins, if positive will need cards.  RENAL A:  1) Acute pre-renal renal failure 2) Hyperkalemia. P:   IVF Trend BMET Replace K as needed.  GASTROINTESTINAL A:  No active issues. P:   D/C TF. Start diet.Marland Kitchen  HEMATOLOGIC A:  Leukocytosis--trending down P:  Likely stress response. Trend CBC See ID section.  INFECTIOUS A:  ?aspiration. P:   F/u cx's Vanc/zosyn--day 2, d/c Start levaquin, d/c if WBC normalizes and no fever.  ENDOCRINE A:  1) ?DM. 2) Steroid induced hyperglycemia  P:   ISS.  NEUROLOGIC A:  Found down with a  Negative head CT. P:   UDS. No focal findings, will monitor for now. Intermittent sedation--fentanyl and versed  Signed: Darden Palmer, MD PGY-I, Internal Medicine Resident Pager: [phone] (7PM-7AM) 10/30/2012,8:05 AM  I have personally obtained a history, examined the patient, evaluated laboratory and imaging results, formulated the assessment and plan and placed orders.  CRITICAL CARE: The patient is critically ill with multiple organ systems failure and requires high complexity decision making for assessment and support, frequent evaluation and titration of therapies, application of advanced monitoring technologies and extensive interpretation of multiple databases. Critical Care Time devoted to patient care services described in this note is 35 minutes.   Alyson Reedy, M.D. Pulmonary and Critical Care Medicine [facility] Pager: [phone]  10/30/2012, 7:56 AM

## 2012-10-30 NOTE — Progress Notes (Signed)
Jill with vascular team paged at 1030 for STAT Bilateral Doppler Carotid study.  Noreene Larsson advised that she will do the study as soon as possible.

## 2012-10-30 NOTE — Progress Notes (Signed)
*  PRELIMINARY RESULTS* Vascular Ultrasound Carotid Duplex (Doppler) has been completed. There is evidence of 40-59% stenosis of the right internal carotid artery. There is no obvious evidence of hemodynamically significant left internal carotid artery stenosis. There is evidence of elevated right external carotid artery velocities, suggestive of stenosis. Vertebral arteries are patent with antegrade flow.  10/30/2012 3:51 PM Gertie Fey, RDMS, RDCS

## 2012-10-31 ENCOUNTER — Inpatient Hospital Stay (HOSPITAL_COMMUNITY): Payer: Medicare Other

## 2012-10-31 ENCOUNTER — Ambulatory Visit: Payer: Medicare Other | Admitting: Cardiology

## 2012-10-31 ENCOUNTER — Ambulatory Visit: Payer: Medicare Other | Admitting: Nurse Practitioner

## 2012-10-31 DIAGNOSIS — Z9889 Other specified postprocedural states: Secondary | ICD-10-CM

## 2012-10-31 LAB — BASIC METABOLIC PANEL
BUN: 46 mg/dL — ABNORMAL HIGH (ref 6–23)
Calcium: 7.9 mg/dL — ABNORMAL LOW (ref 8.4–10.5)
Chloride: 103 mEq/L (ref 96–112)
GFR calc Af Amer: 59 mL/min — ABNORMAL LOW (ref >90)
GFR calc Af Amer: 71 mL/min — ABNORMAL LOW (ref >90)
GFR calc non Af Amer: 51 mL/min — ABNORMAL LOW (ref >90)
GFR calc non Af Amer: 61 mL/min — ABNORMAL LOW (ref >90)
Glucose, Bld: 105 mg/dL — ABNORMAL HIGH (ref 70–99)
Potassium: 2.8 mEq/L — ABNORMAL LOW (ref 3.5–5.1)
Potassium: 3.6 mEq/L (ref 3.5–5.1)
Sodium: 138 mEq/L (ref 135–145)
Sodium: 135 mEq/L (ref 135–145)

## 2012-10-31 LAB — URINE CULTURE: Colony Count: NO GROWTH

## 2012-10-31 LAB — PHOSPHORUS: Phosphorus: 1.9 mg/dL — ABNORMAL LOW (ref 2.3–4.6)

## 2012-10-31 LAB — POCT I-STAT TROPONIN I: Troponin i, poc: 0.1 ng/mL (ref 0.00–0.08)

## 2012-10-31 MED ORDER — POTASSIUM CHLORIDE CRYS ER 20 MEQ PO TBCR
40.0000 meq | EXTENDED_RELEASE_TABLET | Freq: Once | ORAL | Status: AC
  Administered 2012-10-31: 40 meq via ORAL
  Filled 2012-10-31: qty 2

## 2012-10-31 MED ORDER — POTASSIUM PHOSPHATE DIBASIC 3 MMOLE/ML IV SOLN
20.0000 mmol | Freq: Once | INTRAVENOUS | Status: AC
  Administered 2012-10-31: 20 mmol via INTRAVENOUS
  Filled 2012-10-31: qty 6.67

## 2012-10-31 MED ORDER — FAMOTIDINE 20 MG PO TABS
20.0000 mg | ORAL_TABLET | Freq: Every day | ORAL | Status: DC
  Administered 2012-11-01 – 2012-11-02 (×2): 20 mg via ORAL
  Filled 2012-10-31 (×2): qty 1

## 2012-10-31 MED ORDER — DIGOXIN 125 MCG PO TABS
0.1250 mg | ORAL_TABLET | Freq: Every day | ORAL | Status: DC
  Administered 2012-10-31 – 2012-11-05 (×6): 0.125 mg via ORAL
  Filled 2012-10-31 (×6): qty 1

## 2012-10-31 MED ORDER — POTASSIUM CHLORIDE 20 MEQ/15ML (10%) PO LIQD: 40.0000 meq | Freq: Two times a day (BID) | ORAL | Status: DC

## 2012-10-31 MED ORDER — POTASSIUM CHLORIDE CRYS ER 20 MEQ PO TBCR
40.0000 meq | EXTENDED_RELEASE_TABLET | ORAL | Status: AC
  Administered 2012-10-31 (×2): 40 meq via ORAL
  Filled 2012-10-31 (×2): qty 2

## 2012-10-31 MED ORDER — CARVEDILOL 3.125 MG PO TABS
3.1250 mg | ORAL_TABLET | Freq: Two times a day (BID) | ORAL | Status: DC
  Administered 2012-11-01 – 2012-11-05 (×5): 3.125 mg via ORAL
  Filled 2012-10-31 (×11): qty 1

## 2012-10-31 MED ORDER — POTASSIUM CHLORIDE 10 MEQ/100ML IV SOLN: 10.0000 meq | INTRAVENOUS | Status: DC

## 2012-10-31 MED ORDER — PANTOPRAZOLE SODIUM 40 MG PO TBEC: 40.0000 mg | DELAYED_RELEASE_TABLET | Freq: Every day | ORAL | Status: DC

## 2012-10-31 NOTE — Progress Notes (Addendum)
Subjective:  Patient complains of feeling generally weak. and fatigue. He also complains of left arm, left hand pain and mild abdominal discomfort. He is also complaining of shortness of breath which is chronic. Family is at the bedside, noticed worsening cough especially after he drinks.  Objective:  Vital Signs in the last 24 hours: Temp:  [97.9 F (36.6 C)-99.8 F (37.7 C)] 97.9 F (36.6 C) (01/27 1509) Pulse Rate:  [71-88] 84  (01/27 1509) Resp:  [18-33] 19  (01/27 1509) BP: (91-124)/(46-70) 124/64 mmHg (01/27 1509) SpO2:  [93 %-99 %] 97 % (01/27 1509) Weight:  [71.7 kg (158 lb 1.1 oz)] 71.7 kg (158 lb 1.1 oz) (01/27 0600)  Intake/Output from previous day: 01/26 0701 - 01/27 0700 In: 2393 [I.V.:2200; IV Piggyback:193] Out: 2275 [Urine:2275]  Physical Exam:  General appearance: alert, cooperative, appears stated age and no distress  Lungs: Faint bilateral basilar crackles, left base worse than the right, left base coarse crackles. Chest wall: no tenderness  Heart: S1, S2 normal, no S3 or S4 and 3/6 systolic ejection mur, crescendo decrescendo right sternal border.  Abdomen: soft, non-tender; bowel sounds normal; no masses, no organomegaly  Extremities: Bulky hands and feet, left hand is edematous with blebs at the fingertips of second and third digits, no significant warmth or suggestion of acute tenderness with cellulitis. Pulses well felt both hands.  Pulses: 2+ and symmetric. No evidence of acute arterial insufficiency of the left hand. Carotid pulses appears to be normal, carotid bruit cannot be ruled out as it was extremely difficult to hear the sounds.  Neurologic: Grossly normal  Lab Results:  Basename 10/31/12 0430 10/30/12 0418  WBC 12.3* 15.7*  HGB 10.2* 12.4*  PLT 123* 154    Basename 10/31/12 1320 10/31/12 0430  NA 135 138  K 3.6 2.8*  CL 101 103  CO2 24 24  GLUCOSE 105* 96  BUN 37* 46*  CREATININE 1.08 1.25    Basename 10/30/12 2210 10/30/12 1840    TROPONINI 0.43* 0.49*   Hepatic Function Panel  Basename 10/29/12 1320  PROT 7.0  ALBUMIN 3.0*  AST 65*  ALT 35  ALKPHOS 127*  BILITOT 1.1  BILIDIR --  IBILI --    Assessment/Plan:  1. shortness of breath and dyspnea on exertion of multifactorial etiology including acute on chronic  Systolic and diastolic heart failure, probable mild emphysema, advanced age and debility. Left ventricle: The cavity size was mildly to moderately dilated. Systolic function was severely reduced. The estimated ejection fraction was in the range of 20% to 25%. Severe diffuse hypokinesis. Doppler parameters are consistent with a reversible restrictive pattern, indicative of decreased left ventricular diastolic compliance and/or increased left atrial pressure (grade 3 diastolic dysfunction). - Aortic valve: Mild regurgitation. - Mitral valve: Moderate regurgitation. - Left atrium: The atrium was severely dilated. - Right ventricle: The cavity size was dilated. Systolicfunction was mildly reduced.   2. Coronary artery disease S/P CABG, details not known 3. Abnormal serum troponins, probably related to acute renal insufficiency and also acute on chronic systolic and diastolic heart failure, serum troponins are flat and there is no suggestion of acute myocardial injury.  Recommendation: From cardiac standpoint he appears to be doing well, he has new very coarse left basilar crackles and patient complains of cough with swallowing and hence aspiration needs to be considered. Will repeat chest x-ray in the morning to evaluate the same. Although he has significant cardiac comorbidity, including history of thoracic and abdominal aortic aneurysm repair and  CABG remotely, progression of coronary artery disease in this elderly gentleman is probably very likely, however patient is very frail and not stable for any aggressive measures with regard to cardiology. Hence at this point continue medical therapy and once medically  fit, begin consider further evaluation. Patient is not on a beta blocker or an ACE inhibitor presently, his blood pressure is borderline, hence I have not started this.  I will add digoxin and a low dose of beta blocker and see if he tolerates.  May need swallowing evaluation due to cough with drinking water, if CXR shows evidence of aspiration tomorrow.    Pamella Pert, M.D. 10/31/2012, 8:52 PM [facility] Pager: [phone] Office: [phone] If no answer: [phone]

## 2012-10-31 NOTE — Evaluation (Signed)
Physical Therapy Evaluation Patient Details Name: Miguel Swanson MRN: [id number] DOB: [birth year] Today's Date: 10/31/2012 Time: 4098-1191 PT Time Calculation (min): 16 min  PT Assessment / Plan / Recommendation Clinical Impression  Pt s/p hypothermia and VDRF with decr mobility secondary to weakness and decr endurance as well as decr balance.  Will benefit from PT to address endurance and balance issues.  Recommend Rehab c/s.      PT Assessment  Patient needs continued PT services    Follow Up Recommendations  CIR;Supervision/Assistance - 24 hour    Does the patient have the potential to tolerate intense rehabilitation    YES  Barriers to Discharge Decreased caregiver support      Equipment Recommendations  None recommended by PT    Recommendations for Other Services Rehab consult   Frequency Min 3X/week    Precautions / Restrictions Precautions Precautions: Fall Restrictions Weight Bearing Restrictions: No   Pertinent Vitals/Pain VSS, No pain      Mobility  Bed Mobility Bed Mobility: Rolling Right;Right Sidelying to Sit;Sitting - Scoot to [place] of Bed Rolling Right: 4: Min assist Right Sidelying to Sit: 4: Min assist;HOB elevated Sitting - Scoot to Edge of Bed: 4: Min assist Details for Bed Mobility Assistance: Assist needed to get to EOB as well as incr time and cuing needed. Transfers Transfers: Sit to Stand;Stand to Sit;Stand Pivot Transfers Sit to Stand: 1: +2 Total assist;With upper extremity assist;From bed Sit to Stand: Patient Percentage: 80% Stand to Sit: 1: +2 Total assist;With upper extremity assist;With armrests;To chair/3-in-1 Stand to Sit: Patient Percentage: 80% Stand Pivot Transfers: 1: +2 Total assist Stand Pivot Transfers: Patient Percentage: 60% Details for Transfer Assistance: Pt stood with  need of only steadying assist of 20%.  When patient began to step around to chair, lost balance posteriorly needing 40% assist to obtain balance to  upright standing.  Cues also needed to stand and pivot to chair.   Ambulation/Gait Ambulation/Gait Assistance: Not tested (comment) Stairs: No Wheelchair Mobility Wheelchair Mobility: No              PT Diagnosis: Generalized weakness  PT Problem List: Decreased activity tolerance;Decreased balance;Decreased mobility;Decreased safety awareness;Decreased knowledge of use of DME PT Treatment Interventions: DME instruction;Gait training;Functional mobility training;Therapeutic activities;Therapeutic exercise;Balance training;Patient/family education;Stair training   PT Goals Acute Rehab PT Goals PT Goal Formulation: With patient Time For Goal Achievement: 11/14/12 Potential to Achieve Goals: Good Pt will go Supine/Side to Sit: Independently PT Goal: Supine/Side to Sit - Progress: Goal set today Pt will go Sit to Stand: Independently PT Goal: Sit to Stand - Progress: Goal set today Pt will Transfer Bed to Chair/Chair to Bed: Independently PT Transfer Goal: Bed to Chair/Chair to Bed - Progress: Goal set today Pt will Ambulate: 51 - 150 feet;with supervision;with least restrictive assistive device PT Goal: Ambulate - Progress: Goal set today Pt will Go Up / Down Stairs: 1-2 stairs;with min assist;with least restrictive assistive device PT Goal: Up/Down Stairs - Progress: Goal set today  Visit Information  Last PT Received On: 10/31/12 Assistance Needed: +2    Subjective Data  Subjective: "I want to get up." Patient Stated Goal: To get home with my dog   Prior Functioning  Home Living Lives With: Alone Available Help at Discharge: Available PRN/intermittently (can stay with son per pt) Type of Home: House Home Access: Stairs to enter [employer] of Steps: 2 Home Layout: One level Bathroom Shower/Tub: Health visitor: Standard Home Adaptive Equipment: Environmental consultant -  four wheeled;Straight cane;Bedside commode/3-in-1 Prior Function Level of Independence:  Independent;Independent with assistive device(s) (used cane when he felt he needed it.) Able to Take Stairs?: Yes Driving: Yes Vocation: Retired Musician: No difficulties    Cognition  Overall Cognitive Status: Impaired Area of Impairment: Awareness of deficits;Safety/judgement Arousal/Alertness: Awake/alert Orientation Level: Appears intact for tasks assessed Behavior During Session: [facility] for tasks performed Safety/Judgement: Decreased safety judgement for tasks assessed;Decreased awareness of need for assistance Safety/Judgement - Other Comments: poor awareness of deficits today.    Extremity/Trunk Assessment Right Lower Extremity Assessment RLE ROM/Strength/Tone: WFL for tasks assessed Left Lower Extremity Assessment LLE ROM/Strength/Tone: WFL for tasks assessed   Balance Static Standing Balance Static Standing - Balance Support: Bilateral upper extremity supported;During functional activity Static Standing - Level of Assistance: 4: Min assist Static Standing - Comment/# of Minutes: Needed steadying assist for stability as pt losing balance posteriorly and could not sustain without assist.  End of Session PT - End of Session Equipment Utilized During Treatment: Gait belt Activity Tolerance: Patient limited by fatigue Patient left: in chair;with call bell/phone within reach Nurse Communication: Mobility status       INGOLD,Kealy Lewter 10/31/2012, 1:11 PM  [facility] [phone] (pager)

## 2012-10-31 NOTE — Progress Notes (Signed)
CRITICAL CARE RESIDENT NOTE Interim Progress Note     RESULT INTERVENTION TAKEN  1. K 2.6  10x2, 40 meQ x 2  2. Phos 1.9 K phos 20 mmol x 1  3.     Repeat BMET at 2 PM   Signed: Annett Gula, MD  PGY-1 Internal Medicine Resident Pager: [phone] (7PM-7AM) 10/31/2012, 6:00 AM

## 2012-10-31 NOTE — Progress Notes (Signed)
Rehab Admissions Coordinator Note:  Patient was screened by Trish Mage for appropriateness for an Inpatient Acute Rehab Consult.  At this time, we are recommending Skilled Nursing Facility or [facility] therapies if patient progresses well.  Patient has [employer] and they will not approve an acute inpatient rehab admission.    Trish Mage 10/31/2012, 1:51 PM  I can be reached at [phone].

## 2012-10-31 NOTE — Progress Notes (Signed)
PULMONARY  / CRITICAL CARE MEDICINE RESIDENT NOTE  Name: Miguel Swanson MRN: [id number] DOB: [birth year]    ADMISSION DATE:  10/29/2012 CONSULTATION DATE:  10/29/12  REFERRING MD :  EDP  CHIEF COMPLAINT:  LOC and VDRF  BRIEF PATIENT DESCRIPTION: 79 year old male with PMH CAD, found down by neighbor outside.  EMS was called and patient was brought to the ED and patient was intubated.  Was found to be hypothermic.  PCCM was called to admit after patient was intubated.  No other history is available, patient is unconscious and no family is available.  SIGNIFICANT EVENTS / STUDIES:  1/25>>>Found outside, hypothermic, admitted to PCCM service, transferred to ICU 1/26>>>Extubated 1/27>>>Transfer to tele--[facility]  LINES / TUBES: L [place] TLC 1/25>>>1/27 L radial a-line 1/25>>1/26 ET tube 1/25>>1/26  CULTURES: Blood 1/25>>> Urine 1/25>>neg Sputum 1/25>>moderate gram neg rods  ANTIBIOTICS: Vanc 1/25>>>1/26 Zosyn 1/25>>>1/26 Levaquin 1/26>>>  SUBJECTIVE:  Awake and alert, denies any pain or shortness of breath.    VITAL SIGNS: Temp:  [99.3 F (37.4 C)-100.1 F (37.8 C)] 99.3 F (37.4 C) (01/27 0500) Pulse Rate:  [71-87] 76  (01/27 0615) Resp:  [18-31] 22  (01/27 0615) BP: (93-133)/(46-79) 102/55 mmHg (01/27 0615) SpO2:  [93 %-100 %] 93 % (01/27 0615) FiO2 (%):  [40 %] 40 % (01/26 0900) Weight:  [158 lb 1.1 oz (71.7 kg)] 158 lb 1.1 oz (71.7 kg) (01/27 0600) HEMODYNAMICS: CVP:  [2 mmHg-5 mmHg] 4 mmHg VENTILATOR SETTINGS: Vent Mode:  [-] CPAP FiO2 (%):  [40 %] 40 % PEEP:  [5 cmH20] 5 cmH20 Pressure Support:  [5 cmH20] 5 cmH20 INTAKE / OUTPUT: Intake/Output      01/26 0701 - 01/27 0700 01/27 0701 - 01/28 0700   I.V. (mL/kg) 2160 (30.1)    NG/GT     IV Piggyback 109    Total Intake(mL/kg) 2269 (31.6)    Urine (mL/kg/hr) 2275 (1.3)    Total Output 2275    Net -6           PHYSICAL EXAMINATION: General:  Thin pleasant male, NAD Neuro:  AAOX3 HEENT:  [place]-- back of head  abrasion, PERRLA, EOMI Cardiovascular:  RRR, Nl S1/S2, 3/6 SEM RSB Lungs:  Coarse BS diffusely. Abdomen:  Soft, NT, ND and +BS. Musculoskeletal:  Left hand edema and abrasions, -tenderness.  Skin:  warm  LABS:  Lab 10/31/12 0430 10/30/12 2210 10/30/12 1840 10/30/12 1223 10/30/12 0418 10/29/12 1606 10/29/12 1600 10/29/12 1547 10/29/12 1320 10/29/12 1012 10/29/12 0932  HGB 10.2* -- -- -- 12.4* -- -- -- 12.4* -- --  WBC 12.3* -- -- -- 15.7* -- -- -- 22.3* -- --  PLT 123* -- -- -- 154 -- -- -- 186 -- --  NA 138 -- -- -- 137 134* -- -- -- -- --  K 2.8* -- -- -- 3.6 -- -- -- -- -- --  CL 103 -- -- -- 99 94* -- -- -- -- --  CO2 24 -- -- -- 23 22 -- -- -- -- --  GLUCOSE 96 -- -- -- 139* 95 -- -- -- -- --  BUN 46* -- -- -- 61* 55* -- -- -- -- --  CREATININE 1.25 -- -- -- 2.03* 2.01* -- -- -- -- --  CALCIUM 7.7* -- -- -- 8.3* 8.4 -- -- -- -- --  MG 2.3 -- -- -- 2.4 -- -- -- 2.8* -- --  PHOS 1.9* -- -- -- 6.5* -- -- -- 8.9* -- --  AST -- -- -- -- -- -- -- --  65* 49* --  ALT -- -- -- -- -- -- -- -- 35 34 --  ALKPHOS -- -- -- -- -- -- -- -- 127* 137* --  BILITOT -- -- -- -- -- -- -- -- 1.1 0.9 --  PROT -- -- -- -- -- -- -- -- 7.0 7.9 --  ALBUMIN -- -- -- -- -- -- -- -- 3.0* 3.4* --  APTT -- -- -- -- -- -- -- -- 33 31 --  INR -- -- -- -- -- -- -- -- 1.47 1.42 --  LATICACIDVEN -- -- -- -- -- -- -- -- 3.0* -- 5.7*  TROPONINI -- 0.43* 0.49* 0.46* -- -- -- -- -- -- --  PROCALCITON -- -- -- -- -- -- -- -- -- -- --  PROBNP -- -- -- -- -- -- -- -- 21030.0* -- --  O2SATVEN -- -- -- -- -- -- -- -- -- -- --  PHART -- -- -- -- -- -- 7.395 7.463* -- -- --  PCO2ART -- -- -- -- -- -- 45.3* 34.3* -- -- --  PO2ART -- -- -- -- -- -- 459.0* 34.0* -- -- --    Lab 10/29/12 1403 10/29/12 1240  GLUCAP 85 97    CXR:  1/25 ET tube ok, mild pulmonary edema 1/26 Improved pulmonary edema  ASSESSMENT / PLAN:  PULMONARY A: 1) VDRF--due to mental status compromise after being down in the cold 2)  ?Aspiration--gram negative rods tracheal aspirate P:   - extubated 1/26 - IS, no  Film needed further  CARDIOVASCULAR A: 1) Hypotension and bradycardia--likely 2/2 to hypothermia. 2) ?Sepsis--Off pressors 3) ?Syncope 4) NSTEMI--troponins trending down   Lab 10/30/12 2210 10/30/12 1840 10/30/12 1223 10/30/12 1105  TROPONINI 0.43* 0.49* 0.46* 0.51*   P:  Cortisol level--40.7 Cardiology following--Dr. Nadara Eaton: no ongoing MI, supportive management and outpatient follow up for CAD with possible stress test.  Consider low dose BB, statin, and ASA vs. plavix at discharge F/u Echo carotid dopplers: RICA 40-59% stenosis, RECA stenosis  D/C [place] line consider [place]?  RENAL A:  1) Acute pre-renal renal failure--Cr improving  P:   IVF Trend BMET Replace K as needed. kvo  GASTROINTESTINAL A:  No active issues. P:   Heart healthy diet  HEMATOLOGIC A:  1) Leukocytosis--trending down 2) Anemia--Hb trending down  Lab 10/31/12 0430 10/30/12 0418 10/29/12 1320  HGB 10.2* 12.4* 12.4*  HCT 29.6* 35.6* 35.5*  WBC 12.3* 15.7* 22.3*  PLT 123* 154 186   P:  Trend CBC See ID section. FOBT in future when BM noted  INFECTIOUS A:  ?aspiration--gram neg rods in tracheal aspirate. P:   F/u blood and sputum cx's Continue levaquin--day 2, add stop date total 7 days Wound care hand, may need ortho or derm, start with xray  ENDOCRINE A:  1) ?DM P:   ISS.  NEUROLOGIC A:  Found down with a  Negative head CT. P:   UDS. No focal findings, will monitor for now. Fentanyl prn pain, dc   Signed: Darden Palmer, MD PGY-I, Internal Medicine Resident Pager: [phone] (7PM-7AM) 10/31/2012,7:04 AM  I have personally obtained a history, examined the patient, evaluated laboratory and imaging results, formulated the assessment and plan and placed orders.  To [facility] when bed open, tele  Mcarthur Rossetti. Tyson Alias, MD, FACP Pgr: [phone] [facility]

## 2012-10-31 NOTE — Progress Notes (Signed)
UR COMPLETED  

## 2012-11-01 ENCOUNTER — Encounter (HOSPITAL_COMMUNITY): Payer: Self-pay | Admitting: Physician Assistant

## 2012-11-01 DIAGNOSIS — I5040 Unspecified combined systolic (congestive) and diastolic (congestive) heart failure: Secondary | ICD-10-CM | POA: Diagnosis present

## 2012-11-01 DIAGNOSIS — R634 Abnormal weight loss: Secondary | ICD-10-CM | POA: Diagnosis present

## 2012-11-01 DIAGNOSIS — R195 Other fecal abnormalities: Secondary | ICD-10-CM | POA: Clinically undetermined

## 2012-11-01 LAB — CULTURE, RESPIRATORY W GRAM STAIN

## 2012-11-01 LAB — URINE DRUGS OF ABUSE SCREEN W ALC, ROUTINE (REF LAB)
Benzodiazepines.: POSITIVE — AB
Cocaine Metabolites: NEGATIVE
Opiate Screen, Urine: NEGATIVE
Phencyclidine (PCP): NEGATIVE
Propoxyphene: NEGATIVE

## 2012-11-01 LAB — CBC
HCT: 29.6 % — ABNORMAL LOW (ref 39.0–52.0)
Hemoglobin: 10.2 g/dL — ABNORMAL LOW (ref 13.0–17.0)
MCH: 30.1 pg (ref 26.0–34.0)
MCHC: 33.4 g/dL (ref 30.0–36.0)
Platelets: 148 10*3/uL — ABNORMAL LOW (ref 150–400)
RDW: 14.1 % (ref 11.5–15.5)
RDW: 14.8 % (ref 11.5–15.5)
WBC: 12.3 10*3/uL — ABNORMAL HIGH (ref 4.0–10.5)

## 2012-11-01 LAB — BASIC METABOLIC PANEL
Calcium: 8.4 mg/dL (ref 8.4–10.5)
GFR calc non Af Amer: 64 mL/min — ABNORMAL LOW (ref >90)
Glucose, Bld: 113 mg/dL — ABNORMAL HIGH (ref 70–99)
Sodium: 142 mEq/L (ref 135–145)

## 2012-11-01 MED ORDER — ENSURE PUDDING PO PUDG
1.0000 | Freq: Three times a day (TID) | ORAL | Status: DC
  Administered 2012-11-02 – 2012-11-04 (×5): 1 via ORAL

## 2012-11-01 MED ORDER — ZOLPIDEM TARTRATE 5 MG PO TABS
5.0000 mg | ORAL_TABLET | Freq: Once | ORAL | Status: AC
  Administered 2012-11-01: 5 mg via ORAL
  Filled 2012-11-01: qty 1

## 2012-11-01 MED ORDER — TEMAZEPAM 7.5 MG PO CAPS
7.5000 mg | ORAL_CAPSULE | Freq: Every day | ORAL | Status: DC
  Administered 2012-11-01 – 2012-11-02 (×2): 7.5 mg via ORAL
  Filled 2012-11-01 (×3): qty 1

## 2012-11-01 NOTE — Progress Notes (Signed)
Pt NPO at this time until ST sees pt; no meds given at this time due to the possibility of aspiration; pt and family made aware; will cont. To monitor.

## 2012-11-01 NOTE — Consult Note (Signed)
[facility] Consult: 4:23 PM 11/01/2012  Has second MRN # [id number] which I reviewed for at least 20 minutes.   Referring Provider: Dr Butler Denmark Primary Care Physician:  Sanda Linger, MD Primary Gastroenterologist:  none   Reason for Consultation:  Anemia, FOB positive, reflux sxs.   HPI: Miguel Swanson is a 79 y.o. male. CABG around 1995, AAA repair ~ 1996 with subsequent intervention of aneurysm 2013.  Hx bladder cancer.  Lives independently at home.  Admitted 4 days ago after found down by neighbor.  Hypothermic and hypotensive.  1/26 - 1/27 intubated.  Opacities on CXR felt to represent aspiration or edema.   Has globus sensation, observed by family to cough with po's and has 40 # weight loss.  He tends to eat soft diet due to solid and liquid dysphagia.  For at least one year having post prandial abdominal pain. Takes daily Prilosec.   12/2011 and 06/2012 Dr Yetta Barre notes pt had endovascular graft repair of AAA.  Pt says that since then appetite has been poor and has developed the post prandial pain.     Has BMs QOD and has never seen blood in stool or in sputum.   No nausea.  No recall of prior colonoscopy or EGD.  However Dr Yetta Barre notes he had normal colonoscopy in 10/2005 but GI MD not listed.   SLP bedside eval on 1/28:      "Pt. demonstrates a suspected primary esophageal dysphagia evidenced by frequent wet vocal qualtiy and throat clearing. Swallow initiation and laryngeal elevation appear WFL's. Oral prep, manipulation and transit WFL's. Recommend MBS to assess impact of suspected esophageal deficits on pharyngeal phase of swallow function, followed by a barium esophagram. Pt. with suspected chronic esophageal impairment possibly now effecting pharyngeal phase and brief intubation in elderly pt. with current decreased medical status/endurance. Recommend he continue po's until MBS next date of Dys 3 (conservative texture) and thin liquids  with esophageal precautions. Discussed with MD who is in agreement with recommendations".      Past Medical History  Diagnosis Date  . CAD (coronary artery disease)   . Hyperlipidemia   . Papillary carcinoma 2007  . Dysphagia     Past Surgical History  Procedure Date  . Transurethral resection of bladder 2007    resection of papilary tumor and placement of ureteral stent  . Coronary artery bypass graft 2004  . Abdominal aortic aneurysm repair 2005  . Inguinal hernia repair     bil  . Abdominal aortic endovascular stent graft 3/13, 9/13    Prior to Admission medications   Medication Sig Start Date End Date Taking? Authorizing Provider  ALPRAZolam Prudy Feeler) 0.5 MG tablet Take 0.5 mg by mouth 3 (three) times daily.   Yes Historical Provider, MD  aspirin EC 81 MG tablet Take 81 mg by mouth daily.   Yes Historical Provider, MD  atorvastatin (LIPITOR) 40 MG tablet Take 40 mg by mouth every evening.   Yes Historical Provider, MD  hydrochlorothiazide (HYDRODIURIL) 25 MG tablet Take 25 mg by mouth daily.   Yes Historical Provider, MD  Omega-3 Fatty Acids (FISH OIL) 1200 MG CAPS Take 1 capsule by mouth daily.   Yes Historical Provider, MD  omeprazole (PRILOSEC) 20 MG capsule Take 20 mg by mouth daily.   Yes Historical Provider, MD  polyethylene glycol (MIRALAX / GLYCOLAX) packet Take 17 g by mouth daily.   Yes Historical Provider, MD  ranitidine (ZANTAC) 150 MG tablet Take 150 mg by mouth 2 (  two) times daily.   Yes Historical Provider, MD  rOPINIRole (REQUIP) 0.25 MG tablet Take 0.25 mg by mouth at bedtime.   Yes Historical Provider, MD  rosuvastatin (CRESTOR) 20 MG tablet Take 20 mg by mouth at bedtime.   Yes Historical Provider, MD  traMADol (ULTRAM) 50 MG tablet Take 50 mg by mouth every 8 (eight) hours as needed. For pain   Yes Historical Provider, MD    Scheduled Meds:    . carvedilol  3.125 mg Oral BID WC  . digoxin  0.125 mg Oral Daily  . famotidine  20 mg Oral Daily  .  feeding supplement  1 Container Oral TID BM  . heparin subcutaneous  5,000 Units Subcutaneous Q8H  . levofloxacin (LEVAQUIN) IV  750 mg Intravenous Q48H  . multivitamin with minerals  1 tablet Oral Daily  . temazepam  7.5 mg Oral QHS   Infusions:   PRN Meds:    Allergies as of 10/29/2012  . (No Known Allergies)    No family history on file.  History   Social History  . Marital Status: Widowed    Spouse Name: N/A    Number of Children: N/A  . Years of Education: N/A   Occupational History  . Not on file.   Social History Main Topics  . Smoking status: Never Smoker   . Smokeless tobacco: Not on file  . Alcohol Use: No  . Drug Use:   . Sexually Active:    Other Topics Concern  . Not on file   Social History Narrative  . No narrative on file    REVIEW OF SYSTEMS: Constitutional:  generallly weak.  Weight loss ENT:  No nose bleeds Pulm:  Occasional cough, usually not productive CV:  No chest pain or palps.  No pedal edema GU:  No dysuria or blood in urine GI:  As per HPI MS:  Back pain.  Heme:  No hx anemia.    Transfusions:  none Neuro:  No headaches, no stumbling.  No blurry vision Derm:  No itching or sores Endocrine:  No increased thirst, no sweats Immunization:  Not clear about flu shot Travel:  none   PHYSICAL EXAM: Vital signs in last 24 hours: Temp:  [97.7 F (36.5 C)-98.5 F (36.9 C)] 98.2 F (36.8 C) (01/28 1351) Pulse Rate:  [72-84] 81  (01/28 1443) Resp:  [18-24] 18  (01/28 1351) BP: (99-109)/(58-67) 109/67 mmHg (01/28 1351) SpO2:  [95 %-97 %] 97 % (01/28 1351) Weight:  [63.549 kg (140 lb 1.6 oz)] 63.549 kg (140 lb 1.6 oz) (01/28 0651)  General: frail, thin WM.  comfortable Head:  Bandage covering abrasion on back of his scalp  Eyes:  No icterus, no conj pallor Ears:  HOH.  His hearing aids are not in place  Nose:  No congestion or discharge Mouth:  Moist and clear oral MM.  Upper full denture, lower partial not in place.  Lower  molars are gone Neck:  No mass, no bruits, no JVD Lungs:  Crackles in right base Heart: RRR.  No MRG Abdomen:  Soft, NT, ND, no mass, no bruits, active BS.  Well healed long midline scar.   Rectal: not performed.  Brown stool per staff.    Musc/Skeltl: no joint swellling Extremities:  No pedal edema  Neurologic:  Pleasant, HOH, slight UE tremor, no asterixis.  Moves all 4s.  Oriented x 3.   Skin:  No rash or sores.  No telangectasia.  Tattoos:  none Nodes:  No inguinal adenopathy   Psych:  Pleasant, cooperative.   Intake/Output from previous day: 01/27 0701 - 01/28 0700 In: 1794 [P.O.:1422; I.V.:120; IV Piggyback:252] Out: 900 [Urine:900] Intake/Output this shift: Total I/O In: 390 [P.O.:240; IV Piggyback:150] Out: -   LAB RESULTS:  Basename 11/01/12 1030 10/31/12 0430 10/30/12 0418  WBC 9.8 12.3* 15.7*  HGB 11.6* 10.2* 12.4*  HCT 34.7* 29.6* 35.6*  PLT 148* 123* 154  MCV           87  BMET Lab Results  Component Value Date   NA 142 11/01/2012   NA 135 10/31/2012   NA 138 10/31/2012   K 3.9 11/01/2012   K 3.6 10/31/2012   K 2.8* 10/31/2012   CL 106 11/01/2012   CL 101 10/31/2012   CL 103 10/31/2012   CO2 25 11/01/2012   CO2 24 10/31/2012   CO2 24 10/31/2012   GLUCOSE 113* 11/01/2012   GLUCOSE 105* 10/31/2012   GLUCOSE 96 10/31/2012   BUN 29* 11/01/2012   BUN 37* 10/31/2012   BUN 46* 10/31/2012   CREATININE 1.04 11/01/2012   CREATININE 1.08 10/31/2012   CREATININE 1.25 10/31/2012   CALCIUM 8.4 11/01/2012   CALCIUM 7.9* 10/31/2012   CALCIUM 7.7* 10/31/2012   LFT No results found for this basename: PROT:3,ALBUMIN:3,AST:3,ALT:3,ALKPHOS:3,BILITOT:3,BILIDIR:3,IBILI:3 in the last 72 hours PT/INR Lab Results  Component Value Date   INR 1.47 10/29/2012   INR 1.42 10/29/2012   Hepatitis Panel No results found for this basename: HEPBSAG,HCVAB,HEPAIGM,HEPBIGM in the last 72 hours C-Diff No components found with this basename: cdiff    Drugs of Abuse     Component Value Date/Time    LABOPIA NEGATIVE 10/29/2012 1944   COCAINSCRNUR NEGATIVE 10/29/2012 1944   LABBENZ POSITIVE* 10/29/2012 1944   AMPHETMU NEGATIVE 10/29/2012 1944     RADIOLOGY STUDIES: Dg Chest 2 View 10/31/2012    IMPRESSION: Multifocal patchy opacities, new, favored to reflect aspiration or possibly interstitial edema.   Original Report Authenticated By: Charline Bills, M.D.    Dg Hand Complete Left 10/31/2012 IMPRESSION: 1.  Soft tissue swelling in the hand, particularly in the region of the metacarpals, without acute bony abnormality. 2.  Old healed fifth metacarpal fracture. 3.  Degenerative changes of osteoarthritis, as above.   Original Report Authenticated By: Trudie Reed, M.D.    CT Head  10/30/11 1. No acute intracranial abnormality.  2. Moderate to severe generalized atrophy and moderate to severe  chronic microvascular ischemic changes of the white matter.  CT spine 10/30/11 1. No cervical spine fracture identified.  2. Multilevel degenerative disc disease, spondylosis, and facet  degenerative changes with multilevel foraminal stenoses as detailed  above.  3. Emphysematous changes in the visualized lung apices.   ENDOSCOPIC STUDIES: 2007 colonoscopy  IMPRESSION: *  Normocytic anemia *  FOB + stool *  Dysphagia, solid and liquid.  Bedside swallow eval today.  *  Fall at home.  resp difficulties and AMS requiring brief intubation.  *  Multifocal lung opacities.   *  Thrombocytopenia, non-critical.  *  ASPVD, AAA.  S/p open repair in 1990s, endovascular repair twice in 2013.  Wonder if he has mesenteric ischemia to explain the vague post prandial pain, weight loss *  Weight loss *  Hx papillary bladder cancer with TUR and stent in 2007  PLAN: EGD, colonoscopy?  Will pursue esophagram tomorrow in order to determine presence of stricture or neoplasm.   LOS: 3 days   Jennye Moccasin  11/01/2012, 4:23 PM  Pager: [phone]

## 2012-11-01 NOTE — Evaluation (Signed)
Clinical/Bedside Swallow Evaluation Patient Details  Name: Miguel Swanson MRN: [id number] Date of Birth: 1926/06/18  Today's Date: 11/01/2012 Time: 4696-2952 SLP Time Calculation (min): 23 min  Past Medical History:  Past Medical History  Diagnosis Date  . Hx of CABG   . CAD (coronary artery disease)   . Hyperlipidemia   . H/O abdominal aortic aneurysm repair    Past Surgical History: History reviewed. No pertinent past surgical history. HPI:  79 yr old admitted after LOC, found down by neighbor (9pm-9am) resulting in hypotension and hypothermia, significant frostbite left hand.  Pt. intubated on arrvial 1/26 and extubated 1/27.  PMH:  AAA, CAD, CABG, hyperlipidemia.  Observed to cough with water and swallow assessment ordered.  CXR multifocal pathcy opacities favored to reflect aspiration or edema.  Pt./family report recent 40 lb unintentional weight loss, globus sensation in chest/pharynx, coughing with food/liquid.    Assessment / Plan / Recommendation Clinical Impression  Pt. demonstrates a suspected primary esophageal dysphagia evidenced by frequent wet vocal qualtiy and throat clearing.  Swallow initiation and laryngeal elevation appear WFL's.  Oral prep, manipulation and transit WFL's.  Recommend MBS to assess impact of suspected esophageal deficits on pharyngeal phase of swallow function, followed by a barium esophagram.  Pt. with suspected chronic esophageal impairment possibly now effecting pharyngeal phase and brief intubation in elderly pt. with current decreased medical status/endurance.  Recommend he continue po's until MBS next date of Dys 3 (conservative texture) and thin liquids with esophageal precautions.  Discussed with MD who is in agreement with recommendations.     Aspiration Risk  Moderate    Diet Recommendation Dysphagia 3 (Mechanical Soft);Thin liquid   Liquid Administration via: Cup Medication Administration: Crushed with puree Supervision: Intermittent  supervision to cue for compensatory strategies;Patient able to self feed Compensations: Slow rate;Small sips/bites;Follow solids with liquid Postural Changes and/or Swallow Maneuvers: Seated upright 90 degrees;Upright 30-60 min after meal    Other  Recommendations Recommended Consults: MBS;Consider GI evaluation Oral Care Recommendations: Oral care BID   Follow Up Recommendations   (TBD)    Frequency and Duration min 2x/week  2 weeks   Pertinent Vitals/Pain No indications    SLP Swallow Goals Goal #3: Pt. will consume safest diet texture/po's adhering to swallow/esophageal precautions with min verbal cues.    Swallow Study Prior Functional Status          Oral/Motor/Sensory Function Overall Oral Motor/Sensory Function: Appears within functional limits for tasks assessed   Ice Chips Ice chips: Not tested   Thin Liquid Thin Liquid: Impaired Presentation: Cup;Straw Pharyngeal  Phase Impairments: Wet Vocal Quality (increased work of breathing)    Nectar Thick Nectar Thick Liquid: Not tested   Honey Thick Honey Thick Liquid: Not tested   Puree Puree: Impaired Presentation: Self Fed;Spoon Pharyngeal Phase Impairments: Wet Vocal Quality   Solid   GO    Solid: Not tested       Royce Macadamia M.Ed [employer] [phone]  11/01/2012

## 2012-11-01 NOTE — Care Management Note (Unsigned)
    Page 1 of 1   11/01/2012     4:12:11 PM   CARE MANAGEMENT NOTE 11/01/2012  Patient:  Miguel Swanson, Miguel Swanson   Account Number:  [id number]  Date Initiated:  11/01/2012  Documentation initiated by:  Adea Geisel  Subjective/Objective Assessment:   PT ADM ON 10/29/12 WITH HYPOTHERMIA AND RESPIRATORY FAILURE. PTA, PT INDEPENDENT, LIVES ALONE.     Action/Plan:   MET WITH PT AND GRANDDAUGHTER TO DISCUSS DC PLANS.  G-DTR STATES PT WILL LIKELY NEED SOME REHAB PRIOR TO DC HOME. WILL PROBABLY GO LIVE WITH SON WHEN ABLE TO RETURN HOME.   Anticipated DC Date:  11/04/2012   Anticipated DC Plan:  SKILLED NURSING FACILITY  In-house referral  Clinical Social Worker      DC Planning Services  CM consult      Choice offered to / List presented to:             Status of service:  In process, will continue to follow Medicare Important Message given?   (If response is "NO", the following Medicare IM given date fields will be blank) Date Medicare IM given:   Date Additional Medicare IM given:    Discharge Disposition:    Per UR Regulation:  Reviewed for med. necessity/level of care/duration of stay  If discussed at Long Length of Stay Meetings, dates discussed:    Comments:  11/01/12 Raizy Auzenne,RN,BSN [phone] WILL CONSULT CSW TO FACILITATE POSSIBLE DC TO SNF FOR REHAB.

## 2012-11-01 NOTE — Progress Notes (Signed)
NUTRITION FOLLOW UP  Intervention:    Await swallow evaluation/SLP recommendations  Ensure Pudding 3 times daily (170 kcals, 4 gm protein per 4 oz up)  RD to follow for nutrition care plan   Nutrition Dx:   Inadequate oral intake now related to decreased appetite, swallowing difficulty as evidenced PO intake 10%, ongoing  Goal:   Oral intake with meals & supplements to meet >/= 90% of estimated nutrition needs, unmet  Monitor:   PO & supplemental intake, weight, labs, I/O's  Assessment:   Patient extubated 1/26.  PO intake very poor at 5-10% per flowsheet records.  Family observing worsening cough especially after drinking fluids.  Bedside swallow evaluation pending.  Has been receiving Ensure Pudding supplements and likes  ---> will add to active orders.  Height: Ht Readings from Last 1 Encounters:  10/29/12 5\' 9"  (1.753 m)    Weight Status:   Wt Readings from Last 1 Encounters:  11/01/12 140 lb 1.6 oz (63.549 kg)    Re-estimated needs:  Kcal: 1550-1750 Protein: 75-85 gm Fluid: 1.5-1.7 L  Skin: Stage I pressure ulcer to sacrum, abrasions to head & hands  Diet Order: Cardiac   Intake/Output Summary (Last 24 hours) at 11/01/12 1026 Last data filed at 11/01/12 0630  Gross per 24 hour  Intake    582 ml  Output    700 ml  Net   -118 ml    Last BM: 1/28  Labs:   Lab 10/31/12 1320 10/31/12 0430 10/30/12 0418 10/29/12 1320  NA 135 138 137 --  K 3.6 2.8* 3.6 --  CL 101 103 99 --  CO2 24 24 23  --  BUN 37* 46* 61* --  CREATININE 1.08 1.25 2.03* --  CALCIUM 7.9* 7.7* 8.3* --  MG -- 2.3 2.4 2.8*  PHOS -- 1.9* 6.5* 8.9*  GLUCOSE 105* 96 139* --    CBG (last 3)   Basename 10/29/12 1403 10/29/12 1240  GLUCAP 85 97    Scheduled Meds:   . carvedilol  3.125 mg Oral BID WC  . digoxin  0.125 mg Oral Daily  . famotidine  20 mg Oral Daily  . heparin subcutaneous  5,000 Units Subcutaneous Q8H  . levofloxacin (LEVAQUIN) IV  750 mg Intravenous Q48H  .  multivitamin with minerals  1 tablet Oral Daily    Maureen Chatters, RD, LDN Pager #: [phone] After-Hours Pager #: [phone]

## 2012-11-01 NOTE — Progress Notes (Signed)
Physical Therapy Treatment Patient Details Name: Miguel Swanson MRN: [id number] DOB: [birth year] Today's Date: 11/01/2012 Time: 1308-6578 PT Time Calculation (min): 24 min  PT Assessment / Plan / Recommendation Comments on Treatment Session  Pt s/p hypothermia and VDRF after fall outdoors.  Will benefit from continued PT to address balance, endurance and safety issues.  Will need Rehab to reach maximal functional level prior to d/c home.      Follow Up Recommendations  CIR;Supervision/Assistance - 24 hour                 Equipment Recommendations  None recommended by PT    Recommendations for Other Services Rehab consult  Frequency Min 3X/week   Plan Discharge plan remains appropriate;Frequency remains appropriate    Precautions / Restrictions Precautions Precautions: Fall Restrictions Weight Bearing Restrictions: No   Pertinent Vitals/Pain VSS, No pain    Mobility  Bed Mobility Bed Mobility: Rolling Right;Right Sidelying to Sit;Sitting - Scoot to [place] of Bed Rolling Right: 4: Min assist Right Sidelying to Sit: 4: Min assist;HOB elevated Sitting - Scoot to Edge of Bed: 4: Min assist Details for Bed Mobility Assistance: Assist needed to get to EOB as well as incr time and cuing needed.   Transfers Transfers: Sit to Stand;Stand to Sit Sit to Stand: 1: +2 Total assist;With upper extremity assist;From bed Sit to Stand: Patient Percentage: 80% Stand to Sit: 1: +2 Total assist;With upper extremity assist;With armrests;To chair/3-in-1 Stand to Sit: Patient Percentage: 80% Stand Pivot Transfers: Not tested (comment) Details for Transfer Assistance: Pt needed cues for hand placement.  Pt had no LOB with ambulation with RW but without RW, pt is unsteady on his feet.  Loses balance in all directions without device.  Pt needs cues to stay close to RW and needs practice with RW for incr safety awareness.   Ambulation/Gait Ambulation/Gait Assistance: 4: Min assist Ambulation  Distance (Feet): 135 Feet Assistive device: Rolling walker Ambulation/Gait Assistance Details: Pt with unequal step length at times as well as poor postural stability at times secondary to decr endurance and poor coordination of movement.  Unsteady with RW due to poor safety awareness and unsteady without RW due to poor balance reactions.   Gait Pattern: Step-through pattern;Decreased stride length;Ataxic;Trunk flexed;Narrow base of support Gait velocity: decreased Stairs: No Wheelchair Mobility Wheelchair Mobility: No    Exercises General Exercises - Lower Extremity Long Arc Quad: AROM;Seated;10 reps;Both Hip Flexion/Marching: AROM;Both;10 reps;Seated    PT Goals Acute Rehab PT Goals PT Goal: Supine/Side to Sit - Progress: Progressing toward goal PT Goal: Sit to Stand - Progress: Progressing toward goal PT Transfer Goal: Bed to Chair/Chair to Bed - Progress: Progressing toward goal PT Goal: Ambulate - Progress: Progressing toward goal  Visit Information  Last PT Received On: 11/01/12 Assistance Needed: +2    Subjective Data  Subjective: "I don't know how I lived."   Cognition  Overall Cognitive Status: Impaired Area of Impairment: Awareness of deficits;Safety/judgement Arousal/Alertness: Awake/alert Orientation Level: Appears intact for tasks assessed Behavior During Session: [facility] for tasks performed Safety/Judgement: Decreased safety judgement for tasks assessed Safety/Judgement - Other Comments: continues with poor Ambulance person Standing - Balance Support: Bilateral upper extremity supported;During functional activity Static Standing - Level of Assistance: 4: Min assist Static Standing - Comment/# of Minutes: Pt continues to need steadying assist with RW at times and needs min assist to steady without device.    End of Session PT - End of  Session Equipment Utilized During Treatment: Gait belt Activity Tolerance: Patient limited by  fatigue Patient left: in chair;with call bell/phone within reach Nurse Communication: Mobility status        INGOLD,Celese Banner 11/01/2012, 11:20 AM [facility] [phone] (pager)

## 2012-11-01 NOTE — Progress Notes (Signed)
Subjective:  Patient seen earlier today at 9am. I had ordered swallow eval and left message with the RN to inform Dr. Butler Denmark of possible aspiration.  Otherwise patient appears to be doing well and denies any shortness of breath at rest, however states that any activity he does get winded.  He has not had any abdominal discomfort today.  He states that he has had 2 bowel movements and there are well formed.  No other specific complaints today except feeling generally weak.  Objective:  Vital Signs in the last 24 hours: Temp:  [97.7 F (36.5 C)-98.5 F (36.9 C)] 98.2 F (36.8 C) (01/28 1351) Pulse Rate:  [72-84] 81  (01/28 1443) Resp:  [18-24] 18  (01/28 1351) BP: (99-109)/(58-67) 109/67 mmHg (01/28 1351) SpO2:  [95 %-97 %] 97 % (01/28 1351) Weight:  [63.549 kg (140 lb 1.6 oz)] 63.549 kg (140 lb 1.6 oz) (01/28 0651)  Intake/Output from previous day: 01/27 0701 - 01/28 0700 In: 1794 [P.O.:1422; I.V.:120; IV Piggyback:252] Out: 900 [Urine:900]  Physical Exam:  General appearance: alert, cooperative, appears stated age and no distress  Lungs: Faint bilateral basilar crackles, left base worse than the right, left base coarse crackles. Chest wall: no tenderness  Heart: S1, S2 normal, no S3 or S4 and 3/6 systolic ejection mur, crescendo decrescendo right sternal border.  Abdomen: soft, non-tender; bowel sounds normal; no masses, no organomegaly  Extremities: Bulky hands and feet, left hand is edematous with blebs at the fingertips of second and third digits, no significant warmth or suggestion of acute tenderness with cellulitis. Pulses well felt both hands.  Pulses: 2+ and symmetric. No evidence of acute arterial insufficiency of the left hand. Carotid pulses appears to be normal, carotid bruit cannot be ruled out as it was extremely difficult to hear the sounds.  Neurologic: Grossly normal  Lab Results:  Basename 11/01/12 1030 10/31/12 0430  WBC 9.8 12.3*  HGB 11.6* 10.2*  PLT 148*  123*    Basename 11/01/12 1030 10/31/12 1320  NA 142 135  K 3.9 3.6  CL 106 101  CO2 25 24  GLUCOSE 113* 105*  BUN 29* 37*  CREATININE 1.04 1.08    Basename 10/30/12 2210 10/30/12 1840  TROPONINI 0.43* 0.49*   Hepatic Function Panel No results found for this basename: PROT,ALBUMIN,AST,ALT,ALKPHOS,BILITOT,BILIDIR,IBILI in the last 72 hours  Assessment/Plan:  1. shortness of breath and dyspnea on exertion of multifactorial etiology including acute on chronic  Systolic and diastolic heart failure, ? aspiration pneumonia probable mild emphysema, advanced age and debility. Left ventricle: The cavity size was mildly to moderately dilated. Systolic function was severely reduced. The estimated ejection fraction was in the range of 20% to 25%. Severe diffuse hypokinesis. Doppler parameters are consistent with a reversible restrictive pattern, indicative of decreased left ventricular diastolic compliance and/or increased left atrial pressure (grade 3 diastolic dysfunction). - Aortic valve: Mild regurgitation. - Mitral valve: Moderate regurgitation. - Left atrium: The atrium was severely dilated. - Right ventricle: The cavity size was dilated. Systolicfunction was mildly reduced.   2. Coronary artery disease S/P CABG, details not known 3. Abnormal serum troponins, probably related to acute renal insufficiency and also acute on chronic systolic and diastolic heart failure, serum troponins are flat and there is no suggestion of acute myocardial injury.  Recommendation:  Patient is presently tolerating the carvedilol and also digoxin.  Continue present medical therapy and gentle diuresis.  No other specific recommendations from my standpoint and I will be available sidelines for any  cardiac issues. As dictated previously on my progress note from yesterday, patient is very frail for proceeding with any cardiac investigation at this point.  Pamella Pert, M.D. 11/01/2012, 6:47 PM [facility] Pager: [phone] Office: [phone] If no answer: [phone]

## 2012-11-01 NOTE — Progress Notes (Signed)
[facility] PROGRESS NOTE  Miguel Swanson WJX:[id number] DOB: 01-Apr-1926 DOA: 10/29/2012 PCP: Sanda Linger, MD- Also goes to [facility]  Brief narrative: This is an 79 year old male who lives alone at home and was found unresponsive outside of his house by a neighbor. He is brought to the ER found to be hypothermic and lethargic- Glasgow Coma Scale was 3-he was intubated in the ER and admitted by the ICU team.  Further history is obtained today from the patient's grandson. He states that his father dementia go to the grocery store earlier in the day but it did not happen until about 9:00 at night. He tried to get into the back door but his key didn't work and therefore went to the front door. The patient remembers turning away from the door and planning to go to the shed to get some tools to private door open. He was found on the stairs off the porch and likely fell one going towards the shed. He was found with an injury on the back of his head and his left hand.  He was extubated on the following day on 1/26 and transferred to try hospitalists on 1/28.  He was found to have multifocal infiltrates - likely aspiration. Also found to have elevated cardiac enzymes - Dr. Jacinto Halim suspects the elevated troponins are secondary to renal failure and possibly acute heart failure.  Patient also had rhabdomyolysis. Patient was initially given IV fluid boluses and as mentioned above was able to be extubated the following day.  He is also found to be Hemoccult positive and pelvis he is never had a colonoscopy or noticed any blood in the stool. He admits to having severe reflux for the past year usually occurring while he is eating his meal.  Does have a history of bladder cancer.  Assessment/Plan: Principal Problem:  *Hypothermia/altered level of consciousness Due to being outside and unresponsive for approximately 12 hours. Cause uncertain- a fall versus syncope  Active Problems:  Fall versus  syncope CT head did not reveal acute abnormalities-it does reveal moderate to severe chronic microvascular ischemic changes  Workup also included carotid Dopplers which only revealed 40-59% stenosis in the right carotid artery Echo pending   Acute respiratory failure/multifocal pulmonary infiltrates suspected to be aspiration pneumonia Possible aspiration while being unresponsive-he is being treated with Levaquin started on 1/26  Currently asymptomatic is not requiring any O2. He will undergo modified barium swallow tomorrow morning-he is been evaluated by speech -dysphagia 3 diet for now   Hypotension and bradycardia Occurring on admission and suspected to be secondary to hypothermia Currently on beta blocker  Reflux and weight loss and Fecal occult blood positive  40 pound weight loss starting about a year ago but worse in the past few months Is been told in the past that he has GERD and appears he was taking Prilosec as an outpatient Will order an esophagram for tomorrow GI consult requested  Systolic and diastolic heart failure EF 20-25% with severe diffuse hypokinesis of the LV Grade 3 diastolic dysfunction Moderate MR Euvolemic currently-Dr. Jacinto Halim following On digoxin and carvedilol   S/P CABG (coronary artery bypass graft) remote.   H/O abdominal aortic aneurysm repair, Open repair remote and July 2013 endograft placement at [facility], [place], [place]   Hyperlipidemia   Code Status: Full code Family Communication: Grandson Disposition Plan: Likely home DVT prophylaxis: Heparin   Consultants:  Cardiology  Procedures: L Spry TLC 1/25>>>  L radial a-line 1/25>>>  ET tube  1/25>>>  Antibiotics: Vanc 1/25>>>1/26  Zosyn 1/25>>>1/26  Levaquin 1/26>>>   HPI/Subjective: Patient alert sitting up in a chair. He is very hard of hearing and it is difficult to get a history. Speech therapist is also evaluating him and he is admitting to having regurgitation of food during  meals for about a year now. He also states he's been losing weight for a year but it has been more rapid in the past few months. Lucila Maine states that he's had a total of about 40 pounds lost. Patient does not complaining of any abdominal pain. He's never had a colonoscopy and there is no history of peptic ulcers. Further history regarding admission details as mentioned above under narrative.  Objective: Filed Vitals:   11/01/12 0901 11/01/12 0940 11/01/12 1351 11/01/12 1443  BP: 106/63  109/67   Pulse: 83  84 81  Temp:   98.2 F (36.8 C)   TempSrc:   Oral   Resp:   18   Height:      Weight:      SpO2:  95% 97%     Intake/Output Summary (Last 24 hours) at 11/01/12 1453 Last data filed at 11/01/12 1300  Gross per 24 hour  Intake    342 ml  Output    700 ml  Net   -358 ml    Exam:   General:  Alert oriented x3 no distress  Cardiovascular: Regular rate and rhythm no murmurs  Respiratory: Clear to auscultation bilaterally  Abdomen: Cough nontender nondistended bowel sounds positive  Ext: No cyanosis clubbing or edema  Data Reviewed: Basic Metabolic Panel:  Lab 11/01/12 4696 10/31/12 1320 10/31/12 0430 10/30/12 0418 10/29/12 1606 10/29/12 1320  NA 142 135 138 137 134* --  K 3.9 3.6 2.8* 3.6 2.9* --  CL 106 101 103 99 94* --  CO2 25 24 24 23 22  --  GLUCOSE 113* 105* 96 139* 95 --  BUN 29* 37* 46* 61* 55* --  CREATININE 1.04 1.08 1.25 2.03* 2.01* --  CALCIUM 8.4 7.9* 7.7* 8.3* 8.4 --  MG -- -- 2.3 2.4 -- 2.8*  PHOS -- -- 1.9* 6.5* -- 8.9*   Liver Function Tests:  Lab 10/29/12 1320 10/29/12 1012  AST 65* 49*  ALT 35 34  ALKPHOS 127* 137*  BILITOT 1.1 0.9  PROT 7.0 7.9  ALBUMIN 3.0* 3.4*   No results found for this basename: LIPASE:5,AMYLASE:5 in the last 168 hours No results found for this basename: AMMONIA:5 in the last 168 hours CBC:  Lab 11/01/12 1030 10/31/12 0430 10/30/12 0418 10/29/12 1320 10/29/12 1159 10/29/12 1012  WBC 9.8 12.3* 15.7* 22.3* -- 19.7*   NEUTROABS -- -- -- 19.8* -- 16.1*  HGB 11.6* 10.2* 12.4* 12.4* 13.9 --  HCT 34.7* 29.6* 35.6* 35.5* 41.0 --  MCV 90.1 87.1 88.3 89.4 -- 88.3  PLT 148* 123* 154 186 -- 227   Cardiac Enzymes:  Lab 10/30/12 2210 10/30/12 1840 10/30/12 1223 10/30/12 1105 10/29/12 0932  CKTOTAL -- -- 1561* -- 660*  CKMB -- -- 8.8* -- 9.9*  CKMBINDEX -- -- -- -- --  TROPONINI 0.43* 0.49* 0.46* 0.51* --   BNP (last 3 results)  Basename 10/29/12 1320  PROBNP 21030.0*   CBG:  Lab 10/29/12 1403 10/29/12 1240  GLUCAP 85 97    Recent Results (from the past 240 hour(s))  CULTURE, BLOOD (ROUTINE X 2)     Status: Normal (Preliminary result)   Collection Time   10/29/12  1:00 PM  Component Value Range Status Comment   Specimen Description BLOOD CENTRAL LINE   Final    Special Requests BOTTLES DRAWN AEROBIC ONLY 10CC   Final    Culture  Setup Time 10/29/2012 23:09   Final    Culture     Final    Value:        BLOOD CULTURE RECEIVED NO GROWTH TO DATE CULTURE WILL BE HELD FOR 5 DAYS BEFORE ISSUING A FINAL NEGATIVE REPORT   Report Status PENDING   Incomplete   CULTURE, BLOOD (ROUTINE X 2)     Status: Normal (Preliminary result)   Collection Time   10/29/12  1:10 PM      Component Value Range Status Comment   Specimen Description BLOOD RIGHT HAND   Final    Special Requests BOTTLES DRAWN AEROBIC AND ANAEROBIC 10CC   Final    Culture  Setup Time 10/29/2012 23:09   Final    Culture     Final    Value:        BLOOD CULTURE RECEIVED NO GROWTH TO DATE CULTURE WILL BE HELD FOR 5 DAYS BEFORE ISSUING A FINAL NEGATIVE REPORT   Report Status PENDING   Incomplete   CULTURE, RESPIRATORY     Status: Normal   Collection Time   10/29/12  3:40 PM      Component Value Range Status Comment   Specimen Description TRACHEAL ASPIRATE   Final    Special Requests NONE   Final    Gram Stain     Final    Value: NO WBC SEEN     NO SQUAMOUS EPITHELIAL CELLS SEEN     RARE GRAM NEGATIVE RODS   Culture MODERATE KLEBSIELLA  PNEUMONIAE   Final    Report Status 11/01/2012 FINAL   Final    Organism ID, Bacteria KLEBSIELLA PNEUMONIAE   Final   MRSA PCR SCREENING     Status: Normal   Collection Time   10/29/12  5:41 PM      Component Value Range Status Comment   MRSA by PCR NEGATIVE  NEGATIVE Final   URINE CULTURE     Status: Normal   Collection Time   10/29/12  7:44 PM      Component Value Range Status Comment   Specimen Description URINE, CATHETERIZED   Final    Special Requests NONE   Final    Culture  Setup Time 10/30/2012 01:50   Final    Colony Count NO GROWTH   Final    Culture NO GROWTH   Final    Report Status 10/31/2012 FINAL   Final      Studies: Dg Chest 2 View  10/31/2012  *RADIOLOGY REPORT*  Clinical Data: Shortness of breath, congestion, possible aspiration pneumonia  CHEST - 2 VIEW  Comparison: 10/30/2012  Findings: Multifocal patchy opacity in the bilateral upper lobes and right lower lobe, new.  Differential considerations include aspiration (given history) or possibly interstitial edema (given rapid change).  Cardiomegaly. Postsurgical changes related to prior CABG.  Thoracic aortic stent.  Degenerative changes of the visualized thoracolumbar spine.  IMPRESSION: Multifocal patchy opacities, new, favored to reflect aspiration or possibly interstitial edema.   Original Report Authenticated By: Charline Bills, M.D.    Ct Head Wo Contrast  10/29/2012  *RADIOLOGY REPORT*  Clinical Data:  The patient was found unresponsive outside his home earlier today and is suffering from hypothermia.  CT HEAD WITHOUT CONTRAST CT CERVICAL SPINE WITHOUT CONTRAST  Technique:  Multidetector CT  imaging of the head and cervical spine was performed following the standard protocol without intravenous contrast.  Multiplanar CT image reconstructions of the cervical spine were also generated.  Comparison:  None.  CT HEAD  Findings: Severe cortical and deep atrophy and moderate cerebellar atrophy.  Moderate to severe changes of  small vessel disease of the white matter diffusely.  Physiologic calcifications in the basal ganglia.  No mass lesion.  No midline shift.  No acute hemorrhage or hematoma.  No extra-axial fluid collections.  No evidence of acute infarction.  No skull fracture or other focal osseous abnormality involving the skull.  Visualized paranasal sinuses, bilateral mastoid air cells, and bilateral middle ear cavities well-aerated.  Extensive bilateral carotid siphon and left vertebral artery atherosclerosis.  IMPRESSION:  1.  No acute intracranial abnormality. 2.  Moderate to severe generalized atrophy and moderate to severe chronic microvascular ischemic changes of the white matter.  CT CERVICAL SPINE  Findings: No fractures identified involving the cervical spine. Exaggeration of the usual cervical lordosis on the sagittal reconstructed images is likely due to an exaggerated thoracic kyphosis.  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  Disc space narrowing and associated endplate hypertrophic changes at C3-4, C5-6, C6-7, and to a lesser degree C7-T1.  No spinal stenosis.  Facet joints intact with diffuse degenerative changes.  Coronal reformatted images demonstrate an intact craniocervical junction, intact C1-C2 articulation, intact dens, and intact lateral masses.  Combination of uncinate and facet hypertrophy account for multilevel foraminal stenoses including severe right and mild left C3-4, moderate right C4-5, mild right C5-6, mild left C6-7.  Note is made of emphysematous changes in the visualized lung apices.  IMPRESSION:  1.  No cervical spine fracture identified.  2.  Multilevel degenerative disc disease, spondylosis, and facet degenerative changes with multilevel foraminal stenoses as detailed above. 3.  Emphysematous changes in the visualized lung apices.   Original Report Authenticated By: Hulan Saas, M.D.    Ct Cervical Spine Wo Contrast  10/29/2012  *RADIOLOGY  REPORT*  Clinical Data:  The patient was found unresponsive outside his home earlier today and is suffering from hypothermia.  CT HEAD WITHOUT CONTRAST CT CERVICAL SPINE WITHOUT CONTRAST  Technique:  Multidetector CT imaging of the head and cervical spine was performed following the standard protocol without intravenous contrast.  Multiplanar CT image reconstructions of the cervical spine were also generated.  Comparison:  None.  CT HEAD  Findings: Severe cortical and deep atrophy and moderate cerebellar atrophy.  Moderate to severe changes of small vessel disease of the white matter diffusely.  Physiologic calcifications in the basal ganglia.  No mass lesion.  No midline shift.  No acute hemorrhage or hematoma.  No extra-axial fluid collections.  No evidence of acute infarction.  No skull fracture or other focal osseous abnormality involving the skull.  Visualized paranasal sinuses, bilateral mastoid air cells, and bilateral middle ear cavities well-aerated.  Extensive bilateral carotid siphon and left vertebral artery atherosclerosis.  IMPRESSION:  1.  No acute intracranial abnormality. 2.  Moderate to severe generalized atrophy and moderate to severe chronic microvascular ischemic changes of the white matter.  CT CERVICAL SPINE  Findings: No fractures identified involving the cervical spine. Exaggeration of the usual cervical lordosis on the sagittal reconstructed images is likely due to an exaggerated thoracic kyphosis.  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  Disc space narrowing and associated endplate hypertrophic changes at C3-4,  C5-6, C6-7, and to a lesser degree C7-T1.  No spinal stenosis.  Facet joints intact with diffuse degenerative changes.  Coronal reformatted images demonstrate an intact craniocervical junction, intact C1-C2 articulation, intact dens, and intact lateral masses.  Combination of uncinate and facet hypertrophy account for multilevel foraminal  stenoses including severe right and mild left C3-4, moderate right C4-5, mild right C5-6, mild left C6-7.  Note is made of emphysematous changes in the visualized lung apices.  IMPRESSION:  1.  No cervical spine fracture identified.  2.  Multilevel degenerative disc disease, spondylosis, and facet degenerative changes with multilevel foraminal stenoses as detailed above. 3.  Emphysematous changes in the visualized lung apices.   Original Report Authenticated By: Hulan Saas, M.D.    Dg Pelvis Portable  10/29/2012  *RADIOLOGY REPORT*  Clinical Data: Found unresponsive earlier this morning outside. Hypothermia.  PORTABLE PELVIS AP VIEW 10/29/2012 1032 hours:  Comparison: None.  Findings: No acute fractures involving the pelvis or either proximal femur.  Symmetric mild axial joint space narrowing in both hips.  Sacroiliac joints and symphysis pubis intact.  Degenerative changes involving the visualized lower lumbar spine.  IMPRESSION: No acute or significant osseous abnormality for age.   Original Report Authenticated By: Hulan Saas, M.D.    Dg Chest Port 1 View  10/30/2012  *RADIOLOGY REPORT*  Clinical Data: Ventilator dependent respiratory failure. Hypothermia.  PORTABLE CHEST - 1 VIEW 10/30/2012 0519 hours:  Comparison: Portable chest x-rays yesterday.  Findings: Endotracheal tube tip remains in satisfactory position projecting approximately 5 cm above the carina.  Nasogastric tube courses below the diaphragm into the stomach.  Left subclavian central venous catheter tip remains injecting over the SVC. Thoracic aortic stent graft.  Cardiac silhouette enlarged but stable.  Interval improvement in the interstitial and airspace pulmonary edema since yesterday, with minimal interstitial edema persisting.  Mild atelectasis in the lung bases related to a less than optimal inspiration.  No new pulmonary parenchymal abnormalities.  IMPRESSION: Support apparatus satisfactory.  Improved pulmonary edema, with  only minimal interstitial edema persisting.  Suboptimal inspiration accounts for bibasilar atelectasis.  No new abnormalities.   Original Report Authenticated By: Hulan Saas, M.D.    Dg Chest Portable 1 View  10/29/2012  *RADIOLOGY REPORT*  Clinical Data: The patient was found unresponsive outside his home earlier today and is suffering from hypothermia.  Bedside central venous catheter placement.  PORTABLE CHEST - 1 VIEW 10/29/2012 1242 hours:  Comparison: Portable chest x-ray earlier same date 1028 hours.  Findings: Left subclavian central venous catheter tip projects over the lower SVC.  No evidence of pneumothorax mediastinal hematoma. Cardiac silhouette markedly enlarged but stable.  Mild diffuse interstitial and airspace pulmonary edema which has progressed since earlier in the day.  Prior thoracic aortic stent graft. Endotracheal tube tip remains in satisfactory position approximately 5 cm above the carina.  IMPRESSION:  1.  Left subclavian central venous catheter tip projects over the lower SVC.  No acute complicating features. 2.  Remaining support apparatus satisfactory. 3.  Developing interstitial and airspace opacities throughout both lungs, likely pulmonary edema.  Query fluid overload.   Original Report Authenticated By: Hulan Saas, M.D.    Dg Chest Portable 1 View  10/29/2012  *RADIOLOGY REPORT*  Clinical Data: Acute respiratory failure.  Hypothermia. Unresponsive.  PORTABLE CHEST - 1 VIEW  Comparison: None.  Findings: Endotracheal tube is seen with tip approximately 4 cm above carina.  Moderate cardiomegaly is seen.  Both lungs are clear.  No evidence of  pneumothorax or pleural effusion.  Ectasia of the thoracic aorta is noted.  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  IMPRESSION: Moderate cardiomegaly.  No active lung disease.   Original Report Authenticated By: Myles Rosenthal, M.D.    Dg Hand Complete Left  10/31/2012  *RADIOLOGY REPORT*  Clinical  Data: Pain and swelling in the left hand.  Evaluate for fracture.  LEFT HAND - COMPLETE 3+ VIEW  Comparison: No priors.  Findings: Three views of the left hand demonstrate marked soft tissue swelling overlying the metacarpals.  There is mild irregularity of the fifth metacarpal, suggesting an old healed fracture.  No acute displaced fracture, subluxation or dislocation is noted.  Degenerative changes of osteoarthritis are noted, most severe at the first [facility] joint where there is severe joint space narrowing, subchondral sclerosis, subchondral cyst formation and osteophyte formation.  IMPRESSION: 1.  Soft tissue swelling in the hand, particularly in the region of the metacarpals, without acute bony abnormality. 2.  Old healed fifth metacarpal fracture. 3.  Degenerative changes of osteoarthritis, as above.   Original Report Authenticated By: Trudie Reed, M.D.     Scheduled Meds:   . carvedilol  3.125 mg Oral BID WC  . digoxin  0.125 mg Oral Daily  . famotidine  20 mg Oral Daily  . feeding supplement  1 Container Oral TID BM  . heparin subcutaneous  5,000 Units Subcutaneous Q8H  . levofloxacin (LEVAQUIN) IV  750 mg Intravenous Q48H  . multivitamin with minerals  1 tablet Oral Daily  . temazepam  7.5 mg Oral QHS   Continuous Infusions:   ________________________________________________________________________  Time spent in minutes: 35    [facility]  [facility] Pager [phone] If 8PM-8AM, please contact night-coverage at [email or web address], password [facility] 11/01/2012, 2:53 PM  LOS: 3 days

## 2012-11-01 NOTE — Progress Notes (Signed)
MD paged; pt requesting sleeping pill at night; no PRN's ordered; will cont. To monitor.

## 2012-11-01 NOTE — Consult Note (Signed)
Patient seen, examined, and I agree with the above documentation, including the assessment and plan. Agree with barium esophagram tomorrow to better evaluate dysphagia. This study was felt both with the assessment for stricture and also help evaluate motility EGD can be performed thereafter if stricture is seen. Positive FOBT, but reported history of normal colonoscopy in 2007.  No evidence of active bleeding. We will investigate upper GI symptoms first, before deciding on the need for repeat colonoscopy

## 2012-11-02 ENCOUNTER — Inpatient Hospital Stay (HOSPITAL_COMMUNITY): Payer: Medicare Other

## 2012-11-02 DIAGNOSIS — R195 Other fecal abnormalities: Secondary | ICD-10-CM

## 2012-11-02 DIAGNOSIS — I5040 Unspecified combined systolic (congestive) and diastolic (congestive) heart failure: Secondary | ICD-10-CM

## 2012-11-02 DIAGNOSIS — R634 Abnormal weight loss: Secondary | ICD-10-CM

## 2012-11-02 DIAGNOSIS — E785 Hyperlipidemia, unspecified: Secondary | ICD-10-CM

## 2012-11-02 DIAGNOSIS — J189 Pneumonia, unspecified organism: Secondary | ICD-10-CM

## 2012-11-02 DIAGNOSIS — R1310 Dysphagia, unspecified: Secondary | ICD-10-CM

## 2012-11-02 LAB — BENZODIAZEPINE, QUANTITATIVE, URINE
Alprazolam (GC/LC/MS), ur confirm: 107 ng/mL
Alprazolam metabolite (GC/LC/MS), ur confirm: 268 ng/mL
Clonazepam metabolite (GC/LC/MS), ur confirm: NEGATIVE ng/mL
Flunitrazepam metabolite (GC/LC/MS), ur confirm: NEGATIVE ng/mL
Flurazepam GC/MS Conf: NEGATIVE ng/mL
Nordiazepam GC/MS Conf: NEGATIVE ng/mL
Oxazepam GC/MS Conf: NEGATIVE ng/mL
Temazepam GC/MS Conf: NEGATIVE ng/mL

## 2012-11-02 LAB — CLOSTRIDIUM DIFFICILE BY PCR: Toxigenic C. Difficile by PCR: NEGATIVE

## 2012-11-02 MED ORDER — ATORVASTATIN CALCIUM 40 MG PO TABS
40.0000 mg | ORAL_TABLET | Freq: Every evening | ORAL | Status: DC
  Administered 2012-11-02 – 2012-11-04 (×3): 40 mg via ORAL
  Filled 2012-11-02 (×4): qty 1

## 2012-11-02 MED ORDER — PANTOPRAZOLE SODIUM 40 MG PO TBEC: 40.0000 mg | DELAYED_RELEASE_TABLET | Freq: Every day | ORAL | Status: DC

## 2012-11-02 MED ORDER — LEVOFLOXACIN 750 MG PO TABS
750.0000 mg | ORAL_TABLET | ORAL | Status: DC
  Filled 2012-11-02: qty 1

## 2012-11-02 NOTE — Progress Notes (Signed)
Speech Language Pathology Dysphagia Treatment Patient Details Name: Miguel Swanson MRN: [id number] DOB: November 09, 1925 Today's Date: 11/02/2012 Time: 3244-0102 SLP Time Calculation (min): 8 min  Assessment / Plan / Recommendation Clinical Impression  Pt. was educated on results and recommendations after MBS today.  SLP arrived in room to hang sign in room and family present (son, grandaughter, and daughter or daughter-in-law) and provided education on results including diet/liquid recommendations, swallow strategies with rationale.  Family verbalized understanding.  Will follow for safety with diet and implementation of strategies.     Diet Recommendation  Continue with Current Diet: Dysphagia 3 (mechanical soft);Thin liquid    SLP Plan Continue with current plan of care   Pertinent Vitals/Pain No indications   Swallowing Goals  SLP Swallowing Goals Patient will utilize recommended strategies during swallow to increase swallowing safety with: Moderate cueing Swallow Study Goal #2 - Progress: Progressing toward goal Goal #3: Pt. will consume safest diet texture/po's adhering to swallow/esophageal precautions with min verbal cues.  Swallow Study Goal #3 - Progress: Discontinued (comment)  General Temperature Spikes Noted: No Respiratory Status: Room air Behavior/Cognition: Alert;Cooperative;Pleasant mood;Hard of hearing Oral Cavity - Dentition: Dentures, top;Dentures, bottom Patient Positioning: Upright in chair  Oral Cavity - Oral Hygiene Does patient have any of the following "at risk" factors?: None of the above Brush patient's teeth BID with toothbrush (using toothpaste with fluoride): Yes   Dysphagia Treatment Treatment focused on: Patient/family/caregiver education Family/Caregiver Educated: son,grandaugher and daughter or DIL ? Patient observed directly with PO's: No Reason PO's not observed:  (education)        Breck Coons Young.Ed [employer]  [phone]  11/02/2012

## 2012-11-02 NOTE — Progress Notes (Signed)
[facility] PROGRESS NOTE  Miguel Swanson ZOX:[id number] DOB: Jul 10, 1926 DOA: 10/29/2012 PCP: Sanda Linger, MD- Also goes to [facility]  Assessment/Plan: Principal Problem:  *Hypothermia/altered level of consciousness Due to being outside and unresponsive for approximately 12-18 hours. Cause uncertain- a fall vs syncope vs TIA Temp now regulated  Fall versus syncope CT head did not reveal acute abnormalities-it does reveal moderate to severe chronic microvascular ischemic changes. Workup also included carotid Dopplers which only revealed 40-59% stenosis in the right carotid artery Echo: demonstrating severe decreased EF at 20-25%; grade 3 diastolic dysfunction; and diffuse hypokinesis. -Will follow closely for arrythmia; treat for secondary prevention for possible TIA and carotid stenosis. -Cardiology on board for heart failure tx and recommendations.  Acute respiratory failure/multifocal pulmonary infiltrates suspected to be aspiration pneumonia -Possible aspiration while being unresponsive/ongoing dysphagia-he is being treated with Levaquin started on 1/26; will treat for a total of 10 days. -Currently asymptomatic is not requiring any O2, is afebrile and WBC's WNL. -Abnormal MBS; plan is for EGD in am (1/30); meanwhile continue dys 3 diet and thin liquids qith close supervision.   Hypotension and bradycardia -Occurring on admission and suspected to be secondary to hypothermia -Resolved and asymptomatic. -Currently on beta blocker and with good HR  Reflux and weight loss and Fecal occult blood positive  40 pound weight loss starting about a year ago but worse in the past few months Is been told in the past that he has GERD and appears he was taking Prilosec as an outpatient GI planning for EGD and possible dilatation if needed on 1/30 Continue PPI  Systolic and diastolic heart failure EF 20-25% with severe diffuse hypokinesis of the LV Grade 3 diastolic  dysfunction Moderate MR Euvolemic currently-Dr. Jacinto Halim following On digoxin and carvedilol; tolerating treatment well.  S/P CABG (coronary artery bypass graft) remote.   H/O abdominal aortic aneurysm repair, Open repair remote and July 2013 endograft placement at [facility], [place], [place]   Hyperlipidemia: continue statins  Deconditioning: per PT recommendations will benefit of SNf at discharge for conditioning.  Insomnia: continue restoril  Code Status: Full code Family Communication: Grandson Disposition Plan: Likely SNF at discharge for conditioning and then home with son. DVT prophylaxis: Heparin   Consultants:  Cardiology  Procedures: L [place] TLC 1/25>>>  L radial a-line 1/25>>>  ET tube 1/25>>>  Antibiotics: Vanc 1/25>>>1/26  Zosyn 1/25>>>1/26  Levaquin 1/26>>>   HPI/Subjective: Afebrile; feeling overall better; no CP or SOB.  Objective: Filed Vitals:   11/01/12 1443 11/01/12 2020 11/02/12 0408 11/02/12 1255  BP:  114/68 103/59 120/76  Pulse: 81 79 69 76  Temp:  98.9 F (37.2 C) 97.9 F (36.6 C)   TempSrc:  Oral Oral   Resp:  18 20   Height:      Weight:   70.58 kg (155 lb 9.6 oz)   SpO2:  94% 98% 97%    Intake/Output Summary (Last 24 hours) at 11/02/12 1539 Last data filed at 11/02/12 0816  Gross per 24 hour  Intake    240 ml  Output      0 ml  Net    240 ml    Exam:   General:  Alert oriented x3 no distress  Cardiovascular: Regular rate and rhythm no murmurs  Respiratory: Clear to auscultation bilaterally  Abdomen: soft, non tender, nondistended and with positive bowel sounds   Ext: upper extremities on left side with frostbite from environmental exposure; no edema  Data Reviewed: Basic Metabolic  Panel:  Lab 11/01/12 1030 10/31/12 1320 10/31/12 0430 10/30/12 0418 10/29/12 1606 10/29/12 1320  NA 142 135 138 137 134* --  K 3.9 3.6 2.8* 3.6 2.9* --  CL 106 101 103 99 94* --  CO2 25 24 24 23 22  --  GLUCOSE 113* 105* 96 139* 95 --  BUN  29* 37* 46* 61* 55* --  CREATININE 1.04 1.08 1.25 2.03* 2.01* --  CALCIUM 8.4 7.9* 7.7* 8.3* 8.4 --  MG -- -- 2.3 2.4 -- 2.8*  PHOS -- -- 1.9* 6.5* -- 8.9*   Liver Function Tests:  Lab 10/29/12 1320 10/29/12 1012  AST 65* 49*  ALT 35 34  ALKPHOS 127* 137*  BILITOT 1.1 0.9  PROT 7.0 7.9  ALBUMIN 3.0* 3.4*   CBC:  Lab 11/01/12 1030 10/31/12 0430 10/30/12 0418 10/29/12 1320 10/29/12 1159 10/29/12 1012  WBC 9.8 12.3* 15.7* 22.3* -- 19.7*  NEUTROABS -- -- -- 19.8* -- 16.1*  HGB 11.6* 10.2* 12.4* 12.4* 13.9 --  HCT 34.7* 29.6* 35.6* 35.5* 41.0 --  MCV 90.1 87.1 88.3 89.4 -- 88.3  PLT 148* 123* 154 186 -- 227   Cardiac Enzymes:  Lab 10/30/12 2210 10/30/12 1840 10/30/12 1223 10/30/12 1105 10/29/12 0932  CKTOTAL -- -- 1561* -- 660*  CKMB -- -- 8.8* -- 9.9*  CKMBINDEX -- -- -- -- --  TROPONINI 0.43* 0.49* 0.46* 0.51* --   BNP (last 3 results)  Basename 10/29/12 1320  PROBNP 21030.0*   CBG:  Lab 10/29/12 1403 10/29/12 1240  GLUCAP 85 97    Recent Results (from the past 240 hour(s))  CULTURE, BLOOD (ROUTINE X 2)     Status: Normal (Preliminary result)   Collection Time   10/29/12  1:00 PM      Component Value Range Status Comment   Specimen Description BLOOD CENTRAL LINE   Final    Special Requests BOTTLES DRAWN AEROBIC ONLY 10CC   Final    Culture  Setup Time 10/29/2012 23:09   Final    Culture     Final    Value:        BLOOD CULTURE RECEIVED NO GROWTH TO DATE CULTURE WILL BE HELD FOR 5 DAYS BEFORE ISSUING A FINAL NEGATIVE REPORT   Report Status PENDING   Incomplete   CULTURE, BLOOD (ROUTINE X 2)     Status: Normal (Preliminary result)   Collection Time   10/29/12  1:10 PM      Component Value Range Status Comment   Specimen Description BLOOD RIGHT HAND   Final    Special Requests BOTTLES DRAWN AEROBIC AND ANAEROBIC 10CC   Final    Culture  Setup Time 10/29/2012 23:09   Final    Culture     Final    Value:        BLOOD CULTURE RECEIVED NO GROWTH TO DATE CULTURE  WILL BE HELD FOR 5 DAYS BEFORE ISSUING A FINAL NEGATIVE REPORT   Report Status PENDING   Incomplete   CULTURE, RESPIRATORY     Status: Normal   Collection Time   10/29/12  3:40 PM      Component Value Range Status Comment   Specimen Description TRACHEAL ASPIRATE   Final    Special Requests NONE   Final    Gram Stain     Final    Value: NO WBC SEEN     NO SQUAMOUS EPITHELIAL CELLS SEEN     RARE GRAM NEGATIVE RODS   Culture MODERATE KLEBSIELLA  PNEUMONIAE   Final    Report Status 11/01/2012 FINAL   Final    Organism ID, Bacteria KLEBSIELLA PNEUMONIAE   Final   MRSA PCR SCREENING     Status: Normal   Collection Time   10/29/12  5:41 PM      Component Value Range Status Comment   MRSA by PCR NEGATIVE  NEGATIVE Final   URINE CULTURE     Status: Normal   Collection Time   10/29/12  7:44 PM      Component Value Range Status Comment   Specimen Description URINE, CATHETERIZED   Final    Special Requests NONE   Final    Culture  Setup Time 10/30/2012 01:50   Final    Colony Count NO GROWTH   Final    Culture NO GROWTH   Final    Report Status 10/31/2012 FINAL   Final   CLOSTRIDIUM DIFFICILE BY PCR     Status: Normal   Collection Time   11/02/12  1:49 PM      Component Value Range Status Comment   C difficile by pcr NEGATIVE  NEGATIVE Final      Studies: Dg Chest 2 View  10/31/2012  *RADIOLOGY REPORT*  Clinical Data: Shortness of breath, congestion, possible aspiration pneumonia  CHEST - 2 VIEW  Comparison: 10/30/2012  Findings: Multifocal patchy opacity in the bilateral upper lobes and right lower lobe, new.  Differential considerations include aspiration (given history) or possibly interstitial edema (given rapid change).  Cardiomegaly. Postsurgical changes related to prior CABG.  Thoracic aortic stent.  Degenerative changes of the visualized thoracolumbar spine.  IMPRESSION: Multifocal patchy opacities, new, favored to reflect aspiration or possibly interstitial edema.   Original Report  Authenticated By: Charline Bills, M.D.    Ct Head Wo Contrast  10/29/2012  *RADIOLOGY REPORT*  Clinical Data:  The patient was found unresponsive outside his home earlier today and is suffering from hypothermia.  CT HEAD WITHOUT CONTRAST CT CERVICAL SPINE WITHOUT CONTRAST  Technique:  Multidetector CT imaging of the head and cervical spine was performed following the standard protocol without intravenous contrast.  Multiplanar CT image reconstructions of the cervical spine were also generated.  Comparison:  None.  CT HEAD  Findings: Severe cortical and deep atrophy and moderate cerebellar atrophy.  Moderate to severe changes of small vessel disease of the white matter diffusely.  Physiologic calcifications in the basal ganglia.  No mass lesion.  No midline shift.  No acute hemorrhage or hematoma.  No extra-axial fluid collections.  No evidence of acute infarction.  No skull fracture or other focal osseous abnormality involving the skull.  Visualized paranasal sinuses, bilateral mastoid air cells, and bilateral middle ear cavities well-aerated.  Extensive bilateral carotid siphon and left vertebral artery atherosclerosis.  IMPRESSION:  1.  No acute intracranial abnormality. 2.  Moderate to severe generalized atrophy and moderate to severe chronic microvascular ischemic changes of the white matter.  CT CERVICAL SPINE  Findings: No fractures identified involving the cervical spine. Exaggeration of the usual cervical lordosis on the sagittal reconstructed images is likely due to an exaggerated thoracic kyphosis.  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  Disc space narrowing and associated endplate hypertrophic changes at C3-4, C5-6, C6-7, and to a lesser degree C7-T1.  No spinal stenosis.  Facet joints intact with diffuse degenerative changes.  Coronal reformatted images demonstrate an intact craniocervical junction, intact C1-C2 articulation, intact dens, and intact lateral  masses.  Combination of uncinate and facet hypertrophy account for multilevel foraminal stenoses including severe right and mild left C3-4, moderate right C4-5, mild right C5-6, mild left C6-7.  Note is made of emphysematous changes in the visualized lung apices.  IMPRESSION:  1.  No cervical spine fracture identified.  2.  Multilevel degenerative disc disease, spondylosis, and facet degenerative changes with multilevel foraminal stenoses as detailed above. 3.  Emphysematous changes in the visualized lung apices.   Original Report Authenticated By: Hulan Saas, M.D.    Ct Cervical Spine Wo Contrast  10/29/2012  *RADIOLOGY REPORT*  Clinical Data:  The patient was found unresponsive outside his home earlier today and is suffering from hypothermia.  CT HEAD WITHOUT CONTRAST CT CERVICAL SPINE WITHOUT CONTRAST  Technique:  Multidetector CT imaging of the head and cervical spine was performed following the standard protocol without intravenous contrast.  Multiplanar CT image reconstructions of the cervical spine were also generated.  Comparison:  None.  CT HEAD  Findings: Severe cortical and deep atrophy and moderate cerebellar atrophy.  Moderate to severe changes of small vessel disease of the white matter diffusely.  Physiologic calcifications in the basal ganglia.  No mass lesion.  No midline shift.  No acute hemorrhage or hematoma.  No extra-axial fluid collections.  No evidence of acute infarction.  No skull fracture or other focal osseous abnormality involving the skull.  Visualized paranasal sinuses, bilateral mastoid air cells, and bilateral middle ear cavities well-aerated.  Extensive bilateral carotid siphon and left vertebral artery atherosclerosis.  IMPRESSION:  1.  No acute intracranial abnormality. 2.  Moderate to severe generalized atrophy and moderate to severe chronic microvascular ischemic changes of the white matter.  CT CERVICAL SPINE  Findings: No fractures identified involving the cervical  spine. Exaggeration of the usual cervical lordosis on the sagittal reconstructed images is likely due to an exaggerated thoracic kyphosis.  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  Disc space narrowing and associated endplate hypertrophic changes at C3-4, C5-6, C6-7, and to a lesser degree C7-T1.  No spinal stenosis.  Facet joints intact with diffuse degenerative changes.  Coronal reformatted images demonstrate an intact craniocervical junction, intact C1-C2 articulation, intact dens, and intact lateral masses.  Combination of uncinate and facet hypertrophy account for multilevel foraminal stenoses including severe right and mild left C3-4, moderate right C4-5, mild right C5-6, mild left C6-7.  Note is made of emphysematous changes in the visualized lung apices.  IMPRESSION:  1.  No cervical spine fracture identified.  2.  Multilevel degenerative disc disease, spondylosis, and facet degenerative changes with multilevel foraminal stenoses as detailed above. 3.  Emphysematous changes in the visualized lung apices.   Original Report Authenticated By: Hulan Saas, M.D.    Dg Pelvis Portable  10/29/2012  *RADIOLOGY REPORT*  Clinical Data: Found unresponsive earlier this morning outside. Hypothermia.  PORTABLE PELVIS AP VIEW 10/29/2012 1032 hours:  Comparison: None.  Findings: No acute fractures involving the pelvis or either proximal femur.  Symmetric mild axial joint space narrowing in both hips.  Sacroiliac joints and symphysis pubis intact.  Degenerative changes involving the visualized lower lumbar spine.  IMPRESSION: No acute or significant osseous abnormality for age.   Original Report Authenticated By: Hulan Saas, M.D.    Dg Chest Port 1 View  10/30/2012  *RADIOLOGY REPORT*  Clinical Data: Ventilator dependent respiratory failure. Hypothermia.  PORTABLE CHEST - 1 VIEW 10/30/2012 0519 hours:  Comparison: Portable chest x-rays yesterday.  Findings: Endotracheal  tube tip remains in satisfactory position projecting approximately 5 cm above the carina.  Nasogastric tube courses below the diaphragm into the stomach.  Left subclavian central venous catheter tip remains injecting over the SVC. Thoracic aortic stent graft.  Cardiac silhouette enlarged but stable.  Interval improvement in the interstitial and airspace pulmonary edema since yesterday, with minimal interstitial edema persisting.  Mild atelectasis in the lung bases related to a less than optimal inspiration.  No new pulmonary parenchymal abnormalities.  IMPRESSION: Support apparatus satisfactory.  Improved pulmonary edema, with only minimal interstitial edema persisting.  Suboptimal inspiration accounts for bibasilar atelectasis.  No new abnormalities.   Original Report Authenticated By: Hulan Saas, M.D.    Dg Chest Portable 1 View  10/29/2012  *RADIOLOGY REPORT*  Clinical Data: The patient was found unresponsive outside his home earlier today and is suffering from hypothermia.  Bedside central venous catheter placement.  PORTABLE CHEST - 1 VIEW 10/29/2012 1242 hours:  Comparison: Portable chest x-ray earlier same date 1028 hours.  Findings: Left subclavian central venous catheter tip projects over the lower SVC.  No evidence of pneumothorax mediastinal hematoma. Cardiac silhouette markedly enlarged but stable.  Mild diffuse interstitial and airspace pulmonary edema which has progressed since earlier in the day.  Prior thoracic aortic stent graft. Endotracheal tube tip remains in satisfactory position approximately 5 cm above the carina.  IMPRESSION:  1.  Left subclavian central venous catheter tip projects over the lower SVC.  No acute complicating features. 2.  Remaining support apparatus satisfactory. 3.  Developing interstitial and airspace opacities throughout both lungs, likely pulmonary edema.  Query fluid overload.   Original Report Authenticated By: Hulan Saas, M.D.    Dg Chest Portable 1  View  10/29/2012  *RADIOLOGY REPORT*  Clinical Data: Acute respiratory failure.  Hypothermia. Unresponsive.  PORTABLE CHEST - 1 VIEW  Comparison: None.  Findings: Endotracheal tube is seen with tip approximately 4 cm above carina.  Moderate cardiomegaly is seen.  Both lungs are clear.  No evidence of pneumothorax or pleural effusion.  Ectasia of the thoracic aorta is noted.  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  IMPRESSION: Moderate cardiomegaly.  No active lung disease.   Original Report Authenticated By: Myles Rosenthal, M.D.    Dg Hand Complete Left  10/31/2012  *RADIOLOGY REPORT*  Clinical Data: Pain and swelling in the left hand.  Evaluate for fracture.  LEFT HAND - COMPLETE 3+ VIEW  Comparison: No priors.  Findings: Three views of the left hand demonstrate marked soft tissue swelling overlying the metacarpals.  There is mild irregularity of the fifth metacarpal, suggesting an old healed fracture.  No acute displaced fracture, subluxation or dislocation is noted.  Degenerative changes of osteoarthritis are noted, most severe at the first [facility] joint where there is severe joint space narrowing, subchondral sclerosis, subchondral cyst formation and osteophyte formation.  IMPRESSION: 1.  Soft tissue swelling in the hand, particularly in the region of the metacarpals, without acute bony abnormality. 2.  Old healed fifth metacarpal fracture. 3.  Degenerative changes of osteoarthritis, as above.   Original Report Authenticated By: Trudie Reed, M.D.     Scheduled Meds:    . carvedilol  3.125 mg Oral BID WC  . digoxin  0.125 mg Oral Daily  . famotidine  20 mg Oral Daily  . feeding supplement  1 Container Oral TID BM  . heparin subcutaneous  5,000 Units Subcutaneous Q8H  . levofloxacin  750 mg Oral Q48H  .  multivitamin with minerals  1 tablet Oral Daily  . temazepam  7.5 mg Oral QHS   Continuous Infusions:    ________________________________________________________________________  Time spent in minutes: 30    [employer]  [facility] Pager [phone] If 8PM-8AM, please contact night-coverage at [email or web address], password [email or web address] 11/02/2012, 3:39 PM  LOS: 4 days

## 2012-11-02 NOTE — Progress Notes (Signed)
[facility] Daily Rounding Note 11/02/2012, 8:32 AM  SUBJECTIVE:         MBS today:  Moderate oral phase dysphagia;Moderate pharyngeal phase dysphagia...chin tuck strategy was implemented and appeared to prevent penetration with thin although a mild aspiration risk continues... vallecular and pyriform sinsus residue (mild-mod) with decreased sensation that was suspected to penetrate laryngeal vestibule on one occassion... Esophagus with what appeared to be significant residue in distal portion which did not empty during this assessment...  SLP recommends Dys 3 and thin liquids with chin tuck with all liquids, pills crushed, full supervision.  OBJECTIVE:         Vital signs in last 24 hours:    Temp:  [97.9 F (36.6 C)-98.9 F (37.2 C)] 97.9 F (36.6 C) (01/29 0408) Pulse Rate:  [69-84] 69  (01/29 0408) Resp:  [18-20] 20  (01/29 0408) BP: (103-114)/(59-68) 103/59 mmHg (01/29 0408) SpO2:  [94 %-98 %] 98 % (01/29 0408) Weight:  [70.58 kg (155 lb 9.6 oz)] 70.58 kg (155 lb 9.6 oz) (01/29 0408) Last BM Date: 11/02/12 Not reexamined.   Intake/Output from previous day: 01/28 0701 - 01/29 0700 In: 630 [P.O.:480; IV Piggyback:150] Out: -   Intake/Output this shift:    Lab Results:  Basename 11/01/12 1030 10/31/12 0430  WBC 9.8 12.3*  HGB 11.6* 10.2*  HCT 34.7* 29.6*  PLT 148* 123*   BMET  Basename 11/01/12 1030 10/31/12 1320 10/31/12 0430  NA 142 135 138  K 3.9 3.6 2.8*  CL 106 101 103  CO2 25 24 24   GLUCOSE 113* 105* 96  BUN 29* 37* 46*  CREATININE 1.04 1.08 1.25  CALCIUM 8.4 7.9* 7.7*   MBS 11/02/12:  Moderate oral phase dysphagia;Moderate pharyngeal phase dysphagia...chin tuck strategy was implemented and appeared to prevent penetration with thin although a mild aspiration risk continues... vallecular and pyriform sinsus residue (mild-mod) with decreased sensation that was suspected to penetrate laryngeal vestibule on one occassion... Esophagus with what appeared to  be significant residue in distal portion which did not empty during this assessment...  SLP recommends Dys 3 and thin liquids with chin tuck with all liquids, pills crushed, full supervision.   10/02/13 ESOPHOGRAM/BARIUM SWALLOW Findings: Single contrast imaging was performed secondary to  patient immobility. The esophagus was evaluated in both oblique  projections with the patient had up to about 220 - 30 degrees.  With swallowing, disruption of primary peristaltic stripping waves  is evident on all swallows. The esophagus is somewhat patulous and  there is mass effect on the distal esophagus secondary to the  patient's thoracic aortic aneurysm. No definite stricture can be  identified in the esophagus. There is no esophageal diverticulum.  No definite esophageal mass.  13 mm barium tablet comes to rest in the distal third of the  esophagus, just proximal to the location where the esophagus  crosses over the dilated descending thoracic aorta. The tablet  becomes static in this position secondary to the lack of  peristaltic waves and mass effect from the thoracic aorta. No  convincing evidence for stricture at this level.  IMPRESSION:  Nonspecific esophageal motility disorder with somewhat patulous  appearance of the esophagus. Study is limited by patient  immobility, but no convincing stricture or mass lesion is evident.   ASSESMENT: * Dysphagia, solid and liquid. Has dysmotility.  Cleared for supervised pureed diet by SLP.  No evidence of esophageal stricture.  * Normocytic anemia  * FOB + stool * ASPVD, AAA.  S/p open repair in 1990s, endovascular repair twice in 2013. Wonder if he has mesenteric ischemia to explain the vague post prandial pain, weight loss?  Followed at [facility].    PLAN: *  EGD tomorrow, possible esoph dilatation if indicated.    LOS: 4 days   Jennye Moccasin  11/02/2012, 8:32 AM Pager: [phone]

## 2012-11-02 NOTE — Progress Notes (Signed)
Speech Language Pathology  Patient Details Name: Miguel Swanson MRN: [id number] DOB: 1926/06/13 Today's Date: 11/02/2012 Time:  -     MBS completed.  Full report to be documented Recommend:  Dys 3, thin liquid with chin tuck with all liquids, crush pills.  Pt. Needs full supervision.  Significant residue in distal esophagus.  Had barium esophagram following MBS (results unknown)  Darrow Bussing.Ed [employer] [phone]  11/02/2012

## 2012-11-02 NOTE — Clinical Social Work Psychosocial (Signed)
Clinical Social Work Department BRIEF PSYCHOSOCIAL ASSESSMENT 11/02/2012  Patient:  BURNS, TIMSON     Account Number:  [id number]     Admit date:  10/29/2012  Clinical Social Worker:  Thomasene Mohair  Date/Time:  11/02/2012 11:00 AM  Referred by:  Care Management  Date Referred:  11/02/2012 Referred for  SNF Placement   Other Referral:   Interview type:  Patient Other interview type:   Family at bedside    PSYCHOSOCIAL DATA Living Status:  ALONE Admitted from facility:   Level of care:   Primary support name:  Yama Nielson [phone] Primary support relationship to patient:  CHILD, ADULT Degree of support available:   Staci Acosta DIL    CURRENT CONCERNS Current Concerns  Post-Acute Placement   Other Concerns:    SOCIAL WORK ASSESSMENT / PLAN CSW was referred to pt to assist with dc planning. Pt is widowed with 3 adult children and several grandchildren involved in his life. Pt retired from [employer] and was independent at home prior to hospitalization. Pt is a [employer], receives his medications through the [place].  Pt needs SNF at dc for st-rehab. Pt and family are agreeable to dc plan. CSW began SNF bed search. Pt will likely have several SNF options at dc.   Assessment/plan status:  Psychosocial Support/Ongoing Assessment of Needs Other assessment/ plan:   Information/referral to community resources:   Discussed contacted [facility] for future assistance at home with Pt's son.    PATIENT'S/FAMILY'S RESPONSE TO PLAN OF CARE: Pt is very pleasant and is agreeable to SNF. Pt's family is supportive of Pt and want the maximum amount of rehab as possible for Pt.    Frederico Hamman, LCSW [phone]

## 2012-11-02 NOTE — Progress Notes (Signed)
Patient seen, examined, and I agree with the above documentation, including the assessment and plan. Barium swallow shows dysmotility, which likely explains some portion of his dysphagia.  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 No stricture seen and thus dilation likely would not be helpful. However, given post-prandial upper abd pain with anorexia and weight loss, EGD is reasonable to exclude gastric malignancy.  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 After discussion with patient and grand-daughter today when taking into account his other medical comorbities, I do not think he would tolerate colonoscopy and all involved.  Family and patient understand and agree. For now will proceed with EGD tomorrow. Ongoing treatment for CHF and PNA per primary team

## 2012-11-02 NOTE — Progress Notes (Signed)
Contact precautions d/c cdiff negative, taken off per infection prevention protocol Rithy Mandley, Randall An rN

## 2012-11-02 NOTE — Procedures (Signed)
Objective Swallowing Evaluation: Modified Barium Swallowing Study  Patient Details  Name: Miguel Swanson MRN: [id number] Date of Birth: October 28, 1925  Today's Date: 11/02/2012 Time: 1040-1100 SLP Time Calculation (min): 20 min  Past Medical History:  Past Medical History  Diagnosis Date  . CAD (coronary artery disease)   . Hyperlipidemia   . Papillary carcinoma 2007  . Dysphagia    Past Surgical History:  Past Surgical History  Procedure Date  . Transurethral resection of bladder 2007    resection of papilary tumor and placement of ureteral stent  . Coronary artery bypass graft 2004  . Abdominal aortic aneurysm repair 2005  . Inguinal hernia repair     bil  . Abdominal aortic endovascular stent graft 3/13, 9/13   HPI:  79 yr old admitted after LOC, found down by neighbor (9pm-9am) resulting in hypotension and hypothermia, significant frostbite left hand.  Pt. intubated on arrvial 1/26 and extubated 1/27.  PMH:  AAA, CAD, CABG, hyperlipidemia.  Observed to cough with water and swallow assessment ordered.  CXR multifocal pathcy opacities favored to reflect aspiration or edema.  Pt./family report recent 40 lb unintentional weight loss, globus sensation in chest/pharynx, coughing with food/liquid.  MBS followed by esophagram recommended after bedside swallow.      Assessment / Plan / Recommendation Clinical Impression  Dysphagia Diagnosis: Moderate oral phase dysphagia;Moderate pharyngeal phase dysphagia (suspected significant esophageal dysphagia) Clinical impression: Pt. demonstrated moderate motor based oral dysphagia with delyas and additional effort required to orally transit bolus to posterior oral cavtiy and delayed prep, manipulation and mastication with cracker Mild).  Pharyngeal phase is described as both sensory and motor impairments leading to delayed swallow initiation and laryngeal penetration with thin to vocal cords silently.  A chin tuck strategy was implemented and  appeared to prevent penetration with thin although a mild aspiration risk continues.  Of significance is pt.'s vallecular and pyriform sinsus residue (mild-mod) with decreased sensation that was suspected to penetrate laryngeal vestibule on one occassion.  Esophagus what appeared to be significant residue in distal portion which did not empty during this assessment.  Pt. is having an esophagram following this study.  SLP recommends Dys 3 and thin liquids with chin tuck with all liquids, pills crushed, full supervision.  SLP will follow for safety and ability to perform swallow precautions.           Treatment Recommendation  Therapy as outlined in treatment plan below    Diet Recommendation Dysphagia 3 (Mechanical Soft);Thin liquid   Liquid Administration via: Cup;No straw Medication Administration: Crushed with puree Supervision: Full supervision/cueing for compensatory strategies;Patient able to self feed Compensations: Slow rate;Small sips/bites;Follow solids with liquid Postural Changes and/or Swallow Maneuvers: Seated upright 90 degrees;Upright 30-60 min after meal;Chin tuck (chin tuck with liquids)    Other  Recommendations Oral Care Recommendations: Oral care BID   Follow Up Recommendations   (to be determined)    Frequency and Duration min 2x/week  2 weeks   Pertinent Vitals/Pain No indications    SLP Swallow Goals Patient will utilize recommended strategies during swallow to increase swallowing safety with: Moderate cueing Goal #3: Pt. will consume safest diet texture/po's adhering to swallow/esophageal precautions with min verbal cues.  Swallow Study Goal #3 - Progress: Discontinued (comment)      Reason for Referral Objectively evaluate swallowing function   Oral Phase Oral Preparation/Oral Phase Oral Phase: Impaired Oral - Thin Oral - Thin Teaspoon: Piecemeal swallowing;Delayed oral transit;Reduced posterior propulsion;Weak lingual manipulation Oral - Thin  Cup:  Piecemeal swallowing;Delayed oral transit;Reduced posterior propulsion;Weak lingual manipulation Oral - Solids Oral - Regular: Piecemeal swallowing;Delayed oral transit;Reduced posterior propulsion;Weak lingual manipulation   Pharyngeal Phase Pharyngeal Phase Pharyngeal Phase: Impaired Pharyngeal - Thin Pharyngeal - Thin Teaspoon: Delayed swallow initiation;Premature spillage to pyriform sinuses;Penetration/Aspiration during swallow;Reduced laryngeal elevation;Pharyngeal residue - valleculae;Pharyngeal residue - pyriform sinuses;Reduced tongue base retraction;Reduced airway/laryngeal closure Penetration/Aspiration details (thin teaspoon): Material enters airway, CONTACTS cords and not ejected out (silent) Pharyngeal - Thin Cup: Delayed swallow initiation;Premature spillage to pyriform sinuses;Pharyngeal residue - valleculae;Pharyngeal residue - pyriform sinuses;Reduced tongue base retraction;Reduced laryngeal elevation;Premature spillage to valleculae (chin tuck implemented) Pharyngeal - Thin Straw: Penetration/Aspiration during swallow;Reduced laryngeal elevation;Reduced tongue base retraction;Reduced airway/laryngeal closure;Pharyngeal residue - valleculae;Pharyngeal residue - pyriform sinuses Penetration/Aspiration details (thin straw): Material enters airway, remains ABOVE vocal cords and not ejected out (silent) Pharyngeal - Solids Pharyngeal - Puree: Within functional limits Pharyngeal - Regular: Pharyngeal residue - valleculae;Pharyngeal residue - pyriform sinuses (? penetration of residue post swallow)  Cervical Esophageal Phase       Cervical Esophageal Phase Cervical Esophageal Phase: [facility]         Darrow Bussing[employer] [phone]  11/02/2012

## 2012-11-02 NOTE — Progress Notes (Signed)
ANTIBIOTIC CONSULT NOTE - FOLLOW UP  Pharmacy Consult for Levaquin Indication: Empiric PNA, r/o sepsis  No Known Allergies  Patient Measurements: Height: 5\' 9"  (175.3 cm) Weight: 155 lb 9.6 oz (70.58 kg) IBW/kg (Calculated) : 70.7   Vital Signs: Temp: 97.9 F (36.6 C) (01/29 0408) Temp src: Oral (01/29 0408) BP: 103/59 mmHg (01/29 0408) Pulse Rate: 69  (01/29 0408) Intake/Output from previous day: 01/28 0701 - 01/29 0700 In: 630 [P.O.:480; IV Piggyback:150] Out: -  Intake/Output from this shift:    Labs:  Basename 11/01/12 1030 10/31/12 1320 10/31/12 0430  WBC 9.8 -- 12.3*  HGB 11.6* -- 10.2*  PLT 148* -- 123*  LABCREA -- -- --  CREATININE 1.04 1.08 1.25   Estimated Creatinine Clearance: 52.8 ml/min (by C-G formula based on Cr of 1.04). No results found for this basename: VANCOTROUGH:2,VANCOPEAK:2,VANCORANDOM:2,GENTTROUGH:2,GENTPEAK:2,GENTRANDOM:2,TOBRATROUGH:2,TOBRAPEAK:2,TOBRARND:2,AMIKACINPEAK:2,AMIKACINTROU:2,AMIKACIN:2, in the last 72 hours   Microbiology: Recent Results (from the past 720 hour(s))  CULTURE, BLOOD (ROUTINE X 2)     Status: Normal (Preliminary result)   Collection Time   10/29/12  1:00 PM      Component Value Range Status Comment   Specimen Description BLOOD CENTRAL LINE   Final    Special Requests BOTTLES DRAWN AEROBIC ONLY 10CC   Final    Culture  Setup Time 10/29/2012 23:09   Final    Culture     Final    Value:        BLOOD CULTURE RECEIVED NO GROWTH TO DATE CULTURE WILL BE HELD FOR 5 DAYS BEFORE ISSUING A FINAL NEGATIVE REPORT   Report Status PENDING   Incomplete   CULTURE, BLOOD (ROUTINE X 2)     Status: Normal (Preliminary result)   Collection Time   10/29/12  1:10 PM      Component Value Range Status Comment   Specimen Description BLOOD RIGHT HAND   Final    Special Requests BOTTLES DRAWN AEROBIC AND ANAEROBIC 10CC   Final    Culture  Setup Time 10/29/2012 23:09   Final    Culture     Final    Value:        BLOOD CULTURE RECEIVED NO  GROWTH TO DATE CULTURE WILL BE HELD FOR 5 DAYS BEFORE ISSUING A FINAL NEGATIVE REPORT   Report Status PENDING   Incomplete   CULTURE, RESPIRATORY     Status: Normal   Collection Time   10/29/12  3:40 PM      Component Value Range Status Comment   Specimen Description TRACHEAL ASPIRATE   Final    Special Requests NONE   Final    Gram Stain     Final    Value: NO WBC SEEN     NO SQUAMOUS EPITHELIAL CELLS SEEN     RARE GRAM NEGATIVE RODS   Culture MODERATE KLEBSIELLA PNEUMONIAE   Final    Report Status 11/01/2012 FINAL   Final    Organism ID, Bacteria KLEBSIELLA PNEUMONIAE   Final   MRSA PCR SCREENING     Status: Normal   Collection Time   10/29/12  5:41 PM      Component Value Range Status Comment   MRSA by PCR NEGATIVE  NEGATIVE Final   URINE CULTURE     Status: Normal   Collection Time   10/29/12  7:44 PM      Component Value Range Status Comment   Specimen Description URINE, CATHETERIZED   Final    Special Requests NONE   Final  Culture  Setup Time 10/30/2012 01:50   Final    Colony Count NO GROWTH   Final    Culture NO GROWTH   Final    Report Status 10/31/2012 FINAL   Final     Anti-infectives     Start     Dose/Rate Route Frequency Ordered Stop   10/30/12 1400   levofloxacin (LEVAQUIN) IVPB 750 mg        750 mg 100 mL/hr over 90 Minutes Intravenous Every 48 hours 10/30/12 1015     10/29/12 1530   vancomycin (VANCOCIN) 750 mg in sodium chloride 0.9 % 150 mL IVPB  Status:  Discontinued        750 mg 150 mL/hr over 60 Minutes Intravenous Every 24 hours 10/29/12 1447 10/30/12 1009   10/29/12 1500   piperacillin-tazobactam (ZOSYN) IVPB 3.375 g  Status:  Discontinued        3.375 g 12.5 mL/hr over 240 Minutes Intravenous 3 times per day 10/29/12 1424 10/30/12 1009   10/29/12 1245   vancomycin (VANCOCIN) IVPB 1000 mg/200 mL premix  Status:  Discontinued        1,000 mg 200 mL/hr over 60 Minutes Intravenous Every 12 hours 10/29/12 1250 10/29/12 1425   10/29/12 1245    piperacillin-tazobactam (ZOSYN) IVPB 3.375 g  Status:  Discontinued        3.375 g 12.5 mL/hr over 240 Minutes Intravenous 4 times per day 10/29/12 1250 10/29/12 1424          Assessment: 79 y.o M found down by a neighbor on 10/29/12, was hypothermic and required intubation. Now extubated and out of ICU on D# 4 Levaquin for PNA and possible aspiration. Afebrile, wnc 9.8, scr slightly improving, current est. crcl ~ 50 ml/min. Pt. Is getting esophagram to better evaluate dysphagia, currently on Dys-3 diet   Levaquin 1/26 >> Vanc 1/25 >>1/26 Zosyn 1/25 >>1/26  1/25 RCx >> Klebsiella pneumo pansensitive except to Amp 1/25 UCx >>neg 1/25 BCx >>NGTD  Goal of Therapy:  Proper antibiotics for infection/cultures adjusted for renal/hepatic function   Plan:  1. Change levaquin to 750mg  PO q 48 hr 2. Will continue to follow renal function, culture results, LOT, and antibiotic de-escalation plans  3. Adjust levaquin to q 24 hrs dosing if renal function continue improving  Bayard Hugger, PharmD, BCPS  Clinical Pharmacist  Pager: [phone]   11/02/2012 11:36 AM

## 2012-11-03 ENCOUNTER — Inpatient Hospital Stay (HOSPITAL_COMMUNITY): Payer: Medicare Other

## 2012-11-03 ENCOUNTER — Encounter (HOSPITAL_COMMUNITY)
Admission: EM | Disposition: A | Discharge status: Skilled Nursing Facility | Payer: Self-pay | Source: Home / Self Care | Attending: Internal Medicine

## 2012-11-03 ENCOUNTER — Encounter (HOSPITAL_COMMUNITY): Payer: Self-pay

## 2012-11-03 DIAGNOSIS — B3781 Candidal esophagitis: Secondary | ICD-10-CM

## 2012-11-03 HISTORY — PX: BALLOON DILATION: SHX5330

## 2012-11-03 HISTORY — PX: ESOPHAGOGASTRODUODENOSCOPY: SHX5428

## 2012-11-03 LAB — CBC
Platelets: 144 10*3/uL — ABNORMAL LOW (ref 150–400)
RBC: 3.59 MIL/uL — ABNORMAL LOW (ref 4.22–5.81)
RDW: 15 % (ref 11.5–15.5)
WBC: 9.7 10*3/uL (ref 4.0–10.5)

## 2012-11-03 LAB — BASIC METABOLIC PANEL
CO2: 18 mEq/L — ABNORMAL LOW (ref 19–32)
Chloride: 108 mEq/L (ref 96–112)
GFR calc Af Amer: 86 mL/min — ABNORMAL LOW (ref >90)
Sodium: 142 mEq/L (ref 135–145)

## 2012-11-03 SURGERY — EGD (ESOPHAGOGASTRODUODENOSCOPY)
Anesthesia: Moderate Sedation

## 2012-11-03 MED ORDER — MIDAZOLAM HCL 5 MG/ML IJ SOLN
INTRAMUSCULAR | Status: AC
  Filled 2012-11-03: qty 2

## 2012-11-03 MED ORDER — FENTANYL CITRATE 0.05 MG/ML IJ SOLN
INTRAMUSCULAR | Status: AC
  Filled 2012-11-03: qty 2

## 2012-11-03 MED ORDER — FLUCONAZOLE IN SODIUM CHLORIDE 400-0.9 MG/200ML-% IV SOLN
400.0000 mg | Freq: Once | INTRAVENOUS | Status: AC
  Administered 2012-11-03: 400 mg via INTRAVENOUS
  Filled 2012-11-03: qty 200

## 2012-11-03 MED ORDER — IOHEXOL 300 MG/ML  SOLN: 100.0000 mL | Freq: Once | INTRAMUSCULAR | Status: DC | PRN

## 2012-11-03 MED ORDER — FLUCONAZOLE IN SODIUM CHLORIDE 200-0.9 MG/100ML-% IV SOLN
200.0000 mg | INTRAVENOUS | Status: DC
  Administered 2012-11-04 – 2012-11-05 (×2): 200 mg via INTRAVENOUS
  Filled 2012-11-03 (×2): qty 100

## 2012-11-03 MED ORDER — IOHEXOL 300 MG/ML  SOLN
20.0000 mL | INTRAMUSCULAR | Status: AC
  Administered 2012-11-03 (×2): 20 mL via ORAL

## 2012-11-03 MED ORDER — BUTAMBEN-TETRACAINE-BENZOCAINE 2-2-14 % EX AERO
INHALATION_SPRAY | CUTANEOUS | Status: DC | PRN
  Administered 2012-11-03: 1 via TOPICAL

## 2012-11-03 MED ORDER — FENTANYL CITRATE 0.05 MG/ML IJ SOLN
INTRAMUSCULAR | Status: DC | PRN
  Administered 2012-11-03 (×2): 25 ug via INTRAVENOUS

## 2012-11-03 MED ORDER — MIDAZOLAM HCL 10 MG/2ML IJ SOLN
INTRAMUSCULAR | Status: DC | PRN
  Administered 2012-11-03 (×2): 1 mg via INTRAVENOUS

## 2012-11-03 MED ORDER — LEVOFLOXACIN 750 MG PO TABS
750.0000 mg | ORAL_TABLET | Freq: Every day | ORAL | Status: DC
  Administered 2012-11-03 – 2012-11-05 (×3): 750 mg via ORAL
  Filled 2012-11-03 (×3): qty 1

## 2012-11-03 MED ORDER — TEMAZEPAM 15 MG PO CAPS
15.0000 mg | ORAL_CAPSULE | Freq: Every day | ORAL | Status: DC
  Administered 2012-11-03: 15 mg via ORAL
  Filled 2012-11-03: qty 1

## 2012-11-03 MED ORDER — LISINOPRIL 20 MG PO TABS
20.0000 mg | ORAL_TABLET | Freq: Every day | ORAL | Status: DC
  Administered 2012-11-03 – 2012-11-05 (×3): 20 mg via ORAL
  Filled 2012-11-03 (×3): qty 1

## 2012-11-03 NOTE — Progress Notes (Signed)
[facility] PROGRESS NOTE  RAYLAND HAMED ZOX:[id number] DOB: 1925-10-12 DOA: 10/29/2012 PCP: Sanda Linger, MD- Also goes to [facility]  Assessment/Plan: Principal Problem:  *Hypothermia/altered level of consciousness Due to being outside and unresponsive for approximately 12-18 hours. Cause uncertain- a fall vs syncope vs TIA Temp now regulated  Fall versus syncope CT head did not reveal acute abnormalities-it does reveal moderate to severe chronic microvascular ischemic changes. Workup also included carotid Dopplers which only revealed 40-59% stenosis in the right carotid artery Echo: demonstrating severe decreased EF at 20-25%; grade 3 diastolic dysfunction; and diffuse hypokinesis. -Will follow closely for arrythmia; treat for secondary prevention for possible TIA and carotid stenosis. -Cardiology on board for heart failure tx; has recommended to start lisinopril due to low EF. -no signs of fluid overload.  Acute respiratory failure/multifocal pulmonary infiltrates suspected to be aspiration pneumonia -Possible aspiration while being unresponsive/ongoing dysphagia-he is being treated with Levaquin started on 1/26; will treat for a total of 10 days. -Currently asymptomatic is not requiring any O2, is afebrile and WBC's WNL. -Abnormal MBS; plan is to treat esophageal candidiasis as found on EGD and to continue dys 3 diet and thin liquids qith close supervision.   Hypotension and bradycardia -Occurring on admission and suspected to be secondary to hypothermia -Resolved and asymptomatic. -Currently on beta blocker and with good HR  Reflux and weight loss and Fecal occult blood positive  40 pound weight loss starting about a year ago but worse in the past few months -EGD demonstrating esophageal candidiasis -Continue PPI and now fluconazole -No other findings or abnormalities on EGD. -Will check CT abd and pelvis to be thorough on evaluation and look for any other  causes other than decrease PO intake and malnutrition.   Systolic and diastolic heart failure EF 20-25% with severe diffuse hypokinesis of the LV Grade 3 diastolic dysfunction Moderate MR Euvolemic currently-Dr. Jacinto Halim following On digoxin and carvedilol; tolerating treatment well. Per cardiology recommendations will start lisinopril 20mg  daily.  Esophageal candidiasis -started on fluconazole -Will follow GI biopsies  S/P CABG (coronary artery bypass graft) remote.   H/O abdominal aortic aneurysm repair, Open repair remote and July 2013 endograft placement at [facility], [place], [place]   Hyperlipidemia: continue statins  Deconditioning: per PT recommendations will benefit of SNf at discharge for conditioning.  Insomnia: continue restoril  Code Status: Full code Family Communication: Grandson Disposition Plan: Likely SNF at discharge for conditioning and then home with son. DVT prophylaxis: Heparin   Consultants:  Cardiology  Procedures: L [place] TLC 1/25>>>  L radial a-line 1/25>>>  ET tube 1/25>>>  Antibiotics: Vanc 1/25>>>1/26  Zosyn 1/25>>>1/26  Levaquin 1/26>>> Fluconazole 1/30   HPI/Subjective: Afebrile; feeling overall better; no CP or SOB. Patient with good urine output. S/P EGD and with findings for esophageal candidiasis   Objective: Filed Vitals:   11/03/12 1340 11/03/12 1350 11/03/12 1400 11/03/12 1426  BP: 115/68 112/67 130/69 124/75  Pulse:    90  Temp:    97.4 F (36.3 C)  TempSrc:    Oral  Resp: 36 23 30 18   Height:      Weight:      SpO2: 97% 97% 97% 98%    Intake/Output Summary (Last 24 hours) at 11/03/12 1535 Last data filed at 11/03/12 8119  Gross per 24 hour  Intake    240 ml  Output    500 ml  Net   -260 ml    Exam:   General:  Alert oriented  x3 no distress  Cardiovascular: Regular rate and rhythm no murmurs  Respiratory: Clear to auscultation bilaterally  Abdomen: soft, non tender, nondistended and with positive bowel sounds    Ext: upper extremities on left side with frostbite from environmental exposure; no edema  Data Reviewed: Basic Metabolic Panel:  Lab 11/03/12 1610 11/01/12 1030 10/31/12 1320 10/31/12 0430 10/30/12 0418 10/29/12 1320  NA 142 142 135 138 137 --  K 4.9 3.9 3.6 2.8* 3.6 --  CL 108 106 101 103 99 --  CO2 18* 25 24 24 23  --  GLUCOSE 101* 113* 105* 96 139* --  BUN 31* 29* 37* 46* 61* --  CREATININE 0.96 1.04 1.08 1.25 2.03* --  CALCIUM 8.2* 8.4 7.9* 7.7* 8.3* --  MG -- -- -- 2.3 2.4 2.8*  PHOS -- -- -- 1.9* 6.5* 8.9*   Liver Function Tests:  Lab 10/29/12 1320 10/29/12 1012  AST 65* 49*  ALT 35 34  ALKPHOS 127* 137*  BILITOT 1.1 0.9  PROT 7.0 7.9  ALBUMIN 3.0* 3.4*   CBC:  Lab 11/03/12 0430 11/01/12 1030 10/31/12 0430 10/30/12 0418 10/29/12 1320 10/29/12 1012  WBC 9.7 9.8 12.3* 15.7* 22.3* --  NEUTROABS -- -- -- -- 19.8* 16.1*  HGB 10.8* 11.6* 10.2* 12.4* 12.4* --  HCT 32.2* 34.7* 29.6* 35.6* 35.5* --  MCV 89.7 90.1 87.1 88.3 89.4 --  PLT 144* 148* 123* 154 186 --   Cardiac Enzymes:  Lab 10/30/12 2210 10/30/12 1840 10/30/12 1223 10/30/12 1105 10/29/12 0932  CKTOTAL -- -- 1561* -- 660*  CKMB -- -- 8.8* -- 9.9*  CKMBINDEX -- -- -- -- --  TROPONINI 0.43* 0.49* 0.46* 0.51* --   BNP (last 3 results)  Basename 10/29/12 1320  PROBNP 21030.0*   CBG:  Lab 10/29/12 1403 10/29/12 1240  GLUCAP 85 97    Recent Results (from the past 240 hour(s))  CULTURE, BLOOD (ROUTINE X 2)     Status: Normal (Preliminary result)   Collection Time   10/29/12  1:00 PM      Component Value Range Status Comment   Specimen Description BLOOD CENTRAL LINE   Final    Special Requests BOTTLES DRAWN AEROBIC ONLY 10CC   Final    Culture  Setup Time 10/29/2012 23:09   Final    Culture     Final    Value:        BLOOD CULTURE RECEIVED NO GROWTH TO DATE CULTURE WILL BE HELD FOR 5 DAYS BEFORE ISSUING A FINAL NEGATIVE REPORT   Report Status PENDING   Incomplete   CULTURE, BLOOD (ROUTINE X 2)      Status: Normal (Preliminary result)   Collection Time   10/29/12  1:10 PM      Component Value Range Status Comment   Specimen Description BLOOD RIGHT HAND   Final    Special Requests BOTTLES DRAWN AEROBIC AND ANAEROBIC 10CC   Final    Culture  Setup Time 10/29/2012 23:09   Final    Culture     Final    Value:        BLOOD CULTURE RECEIVED NO GROWTH TO DATE CULTURE WILL BE HELD FOR 5 DAYS BEFORE ISSUING A FINAL NEGATIVE REPORT   Report Status PENDING   Incomplete   CULTURE, RESPIRATORY     Status: Normal   Collection Time   10/29/12  3:40 PM      Component Value Range Status Comment   Specimen Description TRACHEAL ASPIRATE   Final  Special Requests NONE   Final    Gram Stain     Final    Value: NO WBC SEEN     NO SQUAMOUS EPITHELIAL CELLS SEEN     RARE GRAM NEGATIVE RODS   Culture MODERATE KLEBSIELLA PNEUMONIAE   Final    Report Status 11/01/2012 FINAL   Final    Organism ID, Bacteria KLEBSIELLA PNEUMONIAE   Final   MRSA PCR SCREENING     Status: Normal   Collection Time   10/29/12  5:41 PM      Component Value Range Status Comment   MRSA by PCR NEGATIVE  NEGATIVE Final   URINE CULTURE     Status: Normal   Collection Time   10/29/12  7:44 PM      Component Value Range Status Comment   Specimen Description URINE, CATHETERIZED   Final    Special Requests NONE   Final    Culture  Setup Time 10/30/2012 01:50   Final    Colony Count NO GROWTH   Final    Culture NO GROWTH   Final    Report Status 10/31/2012 FINAL   Final   CLOSTRIDIUM DIFFICILE BY PCR     Status: Normal   Collection Time   11/02/12  1:49 PM      Component Value Range Status Comment   C difficile by pcr NEGATIVE  NEGATIVE Final      Studies: Dg Chest 2 View  10/31/2012  *RADIOLOGY REPORT*  Clinical Data: Shortness of breath, congestion, possible aspiration pneumonia  CHEST - 2 VIEW  Comparison: 10/30/2012  Findings: Multifocal patchy opacity in the bilateral upper lobes and right lower lobe, new.  Differential  considerations include aspiration (given history) or possibly interstitial edema (given rapid change).  Cardiomegaly. Postsurgical changes related to prior CABG.  Thoracic aortic stent.  Degenerative changes of the visualized thoracolumbar spine.  IMPRESSION: Multifocal patchy opacities, new, favored to reflect aspiration or possibly interstitial edema.   Original Report Authenticated By: Charline Bills, M.D.    Ct Head Wo Contrast  10/29/2012  *RADIOLOGY REPORT*  Clinical Data:  The patient was found unresponsive outside his home earlier today and is suffering from hypothermia.  CT HEAD WITHOUT CONTRAST CT CERVICAL SPINE WITHOUT CONTRAST  Technique:  Multidetector CT imaging of the head and cervical spine was performed following the standard protocol without intravenous contrast.  Multiplanar CT image reconstructions of the cervical spine were also generated.  Comparison:  None.  CT HEAD  Findings: Severe cortical and deep atrophy and moderate cerebellar atrophy.  Moderate to severe changes of small vessel disease of the white matter diffusely.  Physiologic calcifications in the basal ganglia.  No mass lesion.  No midline shift.  No acute hemorrhage or hematoma.  No extra-axial fluid collections.  No evidence of acute infarction.  No skull fracture or other focal osseous abnormality involving the skull.  Visualized paranasal sinuses, bilateral mastoid air cells, and bilateral middle ear cavities well-aerated.  Extensive bilateral carotid siphon and left vertebral artery atherosclerosis.  IMPRESSION:  1.  No acute intracranial abnormality. 2.  Moderate to severe generalized atrophy and moderate to severe chronic microvascular ischemic changes of the white matter.  CT CERVICAL SPINE  Findings: No fractures identified involving the cervical spine. Exaggeration of the usual cervical lordosis on the sagittal reconstructed images is likely due to an exaggerated thoracic kyphosis.  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  Disc space narrowing  and associated endplate hypertrophic changes at C3-4, C5-6, C6-7, and to a lesser degree C7-T1.  No spinal stenosis.  Facet joints intact with diffuse degenerative changes.  Coronal reformatted images demonstrate an intact craniocervical junction, intact C1-C2 articulation, intact dens, and intact lateral masses.  Combination of uncinate and facet hypertrophy account for multilevel foraminal stenoses including severe right and mild left C3-4, moderate right C4-5, mild right C5-6, mild left C6-7.  Note is made of emphysematous changes in the visualized lung apices.  IMPRESSION:  1.  No cervical spine fracture identified.  2.  Multilevel degenerative disc disease, spondylosis, and facet degenerative changes with multilevel foraminal stenoses as detailed above. 3.  Emphysematous changes in the visualized lung apices.   Original Report Authenticated By: Hulan Saas, M.D.    Ct Cervical Spine Wo Contrast  10/29/2012  *RADIOLOGY REPORT*  Clinical Data:  The patient was found unresponsive outside his home earlier today and is suffering from hypothermia.  CT HEAD WITHOUT CONTRAST CT CERVICAL SPINE WITHOUT CONTRAST  Technique:  Multidetector CT imaging of the head and cervical spine was performed following the standard protocol without intravenous contrast.  Multiplanar CT image reconstructions of the cervical spine were also generated.  Comparison:  None.  CT HEAD  Findings: Severe cortical and deep atrophy and moderate cerebellar atrophy.  Moderate to severe changes of small vessel disease of the white matter diffusely.  Physiologic calcifications in the basal ganglia.  No mass lesion.  No midline shift.  No acute hemorrhage or hematoma.  No extra-axial fluid collections.  No evidence of acute infarction.  No skull fracture or other focal osseous abnormality involving the skull.  Visualized paranasal sinuses, bilateral mastoid air cells, and bilateral  middle ear cavities well-aerated.  Extensive bilateral carotid siphon and left vertebral artery atherosclerosis.  IMPRESSION:  1.  No acute intracranial abnormality. 2.  Moderate to severe generalized atrophy and moderate to severe chronic microvascular ischemic changes of the white matter.  CT CERVICAL SPINE  Findings: No fractures identified involving the cervical spine. Exaggeration of the usual cervical lordosis on the sagittal reconstructed images is likely due to an exaggerated thoracic kyphosis.  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  Disc space narrowing and associated endplate hypertrophic changes at C3-4, C5-6, C6-7, and to a lesser degree C7-T1.  No spinal stenosis.  Facet joints intact with diffuse degenerative changes.  Coronal reformatted images demonstrate an intact craniocervical junction, intact C1-C2 articulation, intact dens, and intact lateral masses.  Combination of uncinate and facet hypertrophy account for multilevel foraminal stenoses including severe right and mild left C3-4, moderate right C4-5, mild right C5-6, mild left C6-7.  Note is made of emphysematous changes in the visualized lung apices.  IMPRESSION:  1.  No cervical spine fracture identified.  2.  Multilevel degenerative disc disease, spondylosis, and facet degenerative changes with multilevel foraminal stenoses as detailed above. 3.  Emphysematous changes in the visualized lung apices.   Original Report Authenticated By: Hulan Saas, M.D.    Dg Pelvis Portable  10/29/2012  *RADIOLOGY REPORT*  Clinical Data: Found unresponsive earlier this morning outside. Hypothermia.  PORTABLE PELVIS AP VIEW 10/29/2012 1032 hours:  Comparison: None.  Findings: No acute fractures involving the pelvis or either proximal femur.  Symmetric mild axial joint space narrowing in both hips.  Sacroiliac joints and symphysis pubis intact.  Degenerative changes involving the visualized lower lumbar spine.   IMPRESSION: No acute or significant osseous abnormality for age.  Original Report Authenticated By: Hulan Saas, M.D.    Dg Chest Port 1 View  10/30/2012  *RADIOLOGY REPORT*  Clinical Data: Ventilator dependent respiratory failure. Hypothermia.  PORTABLE CHEST - 1 VIEW 10/30/2012 0519 hours:  Comparison: Portable chest x-rays yesterday.  Findings: Endotracheal tube tip remains in satisfactory position projecting approximately 5 cm above the carina.  Nasogastric tube courses below the diaphragm into the stomach.  Left subclavian central venous catheter tip remains injecting over the SVC. Thoracic aortic stent graft.  Cardiac silhouette enlarged but stable.  Interval improvement in the interstitial and airspace pulmonary edema since yesterday, with minimal interstitial edema persisting.  Mild atelectasis in the lung bases related to a less than optimal inspiration.  No new pulmonary parenchymal abnormalities.  IMPRESSION: Support apparatus satisfactory.  Improved pulmonary edema, with only minimal interstitial edema persisting.  Suboptimal inspiration accounts for bibasilar atelectasis.  No new abnormalities.   Original Report Authenticated By: Hulan Saas, M.D.    Dg Chest Portable 1 View  10/29/2012  *RADIOLOGY REPORT*  Clinical Data: The patient was found unresponsive outside his home earlier today and is suffering from hypothermia.  Bedside central venous catheter placement.  PORTABLE CHEST - 1 VIEW 10/29/2012 1242 hours:  Comparison: Portable chest x-ray earlier same date 1028 hours.  Findings: Left subclavian central venous catheter tip projects over the lower SVC.  No evidence of pneumothorax mediastinal hematoma. Cardiac silhouette markedly enlarged but stable.  Mild diffuse interstitial and airspace pulmonary edema which has progressed since earlier in the day.  Prior thoracic aortic stent graft. Endotracheal tube tip remains in satisfactory position approximately 5 cm above the carina.   IMPRESSION:  1.  Left subclavian central venous catheter tip projects over the lower SVC.  No acute complicating features. 2.  Remaining support apparatus satisfactory. 3.  Developing interstitial and airspace opacities throughout both lungs, likely pulmonary edema.  Query fluid overload.   Original Report Authenticated By: Hulan Saas, M.D.    Dg Chest Portable 1 View  10/29/2012  *RADIOLOGY REPORT*  Clinical Data: Acute respiratory failure.  Hypothermia. Unresponsive.  PORTABLE CHEST - 1 VIEW  Comparison: None.  Findings: Endotracheal tube is seen with tip approximately 4 cm above carina.  Moderate cardiomegaly is seen.  Both lungs are clear.  No evidence of pneumothorax or pleural effusion.  Ectasia of the thoracic aorta is noted.  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  IMPRESSION: Moderate cardiomegaly.  No active lung disease.   Original Report Authenticated By: Myles Rosenthal, M.D.    Dg Hand Complete Left  10/31/2012  *RADIOLOGY REPORT*  Clinical Data: Pain and swelling in the left hand.  Evaluate for fracture.  LEFT HAND - COMPLETE 3+ VIEW  Comparison: No priors.  Findings: Three views of the left hand demonstrate marked soft tissue swelling overlying the metacarpals.  There is mild irregularity of the fifth metacarpal, suggesting an old healed fracture.  No acute displaced fracture, subluxation or dislocation is noted.  Degenerative changes of osteoarthritis are noted, most severe at the first [facility] joint where there is severe joint space narrowing, subchondral sclerosis, subchondral cyst formation and osteophyte formation.  IMPRESSION: 1.  Soft tissue swelling in the hand, particularly in the region of the metacarpals, without acute bony abnormality. 2.  Old healed fifth metacarpal fracture. 3.  Degenerative changes of osteoarthritis, as above.   Original Report Authenticated By: Trudie Reed, M.D.     Scheduled Meds:    . atorvastatin  40 mg Oral  QPM  .  carvedilol  3.125 mg Oral BID WC  . digoxin  0.125 mg Oral Daily  . feeding supplement  1 Container Oral TID BM  . fluconazole (DIFLUCAN) IV  200 mg Intravenous Q24H  . fluconazole (DIFLUCAN) IV  400 mg Intravenous Once  . levofloxacin  750 mg Oral Daily  . multivitamin with minerals  1 tablet Oral Daily  . pantoprazole  40 mg Oral Q1200  . temazepam  15 mg Oral QHS   Continuous Infusions:   ________________________________________________________________________  Time spent in minutes: 30    Kourosh Jablonsky  [facility] Pager [phone] If 8PM-8AM, please contact night-coverage at [email or web address], password [facility] 11/03/2012, 3:35 PM  LOS: 5 days

## 2012-11-03 NOTE — Op Note (Signed)
Miguel Swanson [facility] [address] [place], [address]   ENDOSCOPY PROCEDURE REPORT  PATIENT: Miguel, Swanson  MR#: [id number] BIRTHDATE: 1926/09/22 , 86  yrs. old GENDER: Male ENDOSCOPIST: Beverley Fiedler, MD REFERRED BY:  Hospitalist, [facility] PROCEDURE DATE:  11/03/2012 PROCEDURE:  EGD w/ biopsy ASA CLASS:     Class III INDICATIONS:  Dysphagia.   Epigastric pain.   Weight loss. MEDICATIONS: Fentanyl 50 mcg IV and Versed 2 mg IV TOPICAL ANESTHETIC: Cetacaine Spray  DESCRIPTION OF PROCEDURE: After the risks benefits and alternatives of the procedure were thoroughly explained, informed consent was obtained.  The Pentax Gastroscope [id number] endoscope was introduced through the mouth and advanced to the second portion of the duodenum. Without limitations.  The instrument was slowly withdrawn as the mucosa was fully examined.    ESOPHAGUS: White exudates consistent with candidiasis were found in the upper third of the esophagus and middle third of the esophagus. Prominent extrinsic compression in mid esophagus felt to result from thoracic aorta, but the scope passed easily.  The GE junction was widely patent without evidence for stricture  STOMACH: A semi-pedunculated polyp measuring 1 cm in size was found on the lesser curvature of the gastric body.  Multiple biopsies was performed using cold forceps.   The stomach otherwise appeared normal.  DUODENUM: The duodenal mucosa showed no abnormalities in the bulb and second portion of the duodenum.  Retroflexed views revealed no abnormalities.     The scope was then withdrawn from the patient and the procedure completed.  COMPLICATIONS: There were no complications.  ENDOSCOPIC IMPRESSION: 1.   White exudates consistent with candidiasis 2.   Semi-pedunculated polyp measuring 1 cm in size was found on the lesser curvature of the gastric body; biopsy performed 3.   The stomach otherwise appeared normal 4.   The  duodenal mucosa showed no abnormalities in the bulb and second portion of the duodenum      RECOMMENDATIONS: 1.  Await pathology results 2.  Fluconazole 400 mg x1 day, then 200 mg x9 days for the treatment of esophageal candidiasis 3.  Remain upright for at least 90 minutes after eating, take small bites and chew food well.  Some of dysphagia is felt secondary to esophageal dysmotility.   eSigned:  Beverley Fiedler, MD 11/03/2012 1:39 PM   CC:The Patient  PATIENT NAME:  Miguel Swanson, Miguel Swanson MR#: [id number]

## 2012-11-03 NOTE — Progress Notes (Signed)
EGD completed; see report Esophageal candidiasis without stricture Gastric polyp, biopsied.  Otherwise normal stomach Normal examined duodenum  Candidiasis likely helps explain trouble swallowing, and he definitely has esophageal dysmotility; the latter of which has no great medical treatment No gastric or duodenal explanation for weight loss and upper abdominal pain; thus would consider CT scan of the abdomen and pelvis for further evaluation of weight loss. Will defer this decision to primary team I will followup results of gastric polyp, and if benign no further intervention on this lesion will be necessary

## 2012-11-03 NOTE — Progress Notes (Signed)
Notified pt's anaerobic blood cultures positive for gram - rods. On-call provider notified. Will continue to monitor.

## 2012-11-03 NOTE — Progress Notes (Signed)
Patient has been pleasantly confused during the night shift.  Patient is on the bed alarm for safety and has attempted. to get out of bed a few times. Patient woke up around 0030 unaware of his surroundings and stated that he wanted to get his car and go home.  Reoriented patient to current situation.  Will continue to monitor.

## 2012-11-03 NOTE — Progress Notes (Signed)
PT CANCELLATION NOTE  PT cancelled today, pt at endo. Will follow.  Ralene Bathe Kistler PT 11/03/2012  [phone]

## 2012-11-03 NOTE — Clinical Social Work Note (Signed)
CSW followed up with family re: SNF bed offers. They have several options for dc and have narrowed it down to 2. Both facilities will have availability on Friday or Saturday if Pt is medically ready for dc.    CSW will continue to follow up.  Frederico Hamman, LCSW [phone]

## 2012-11-03 NOTE — Progress Notes (Signed)
Pt returned from CT. Unable to perform abd CT r/t previous upper GI this am. Radiologist aware. Will continue to monitor.

## 2012-11-04 ENCOUNTER — Encounter (HOSPITAL_COMMUNITY): Payer: Self-pay | Admitting: Internal Medicine

## 2012-11-04 DIAGNOSIS — B9689 Other specified bacterial agents as the cause of diseases classified elsewhere: Secondary | ICD-10-CM

## 2012-11-04 DIAGNOSIS — R7881 Bacteremia: Secondary | ICD-10-CM

## 2012-11-04 LAB — CULTURE, BLOOD (ROUTINE X 2): Culture: NO GROWTH

## 2012-11-04 MED ORDER — TEMAZEPAM 15 MG PO CAPS
30.0000 mg | ORAL_CAPSULE | Freq: Every day | ORAL | Status: DC
  Administered 2012-11-04: 30 mg via ORAL
  Filled 2012-11-04: qty 2

## 2012-11-04 MED ORDER — SIMETHICONE 40 MG/0.6ML PO SUSP
40.0000 mg | Freq: Four times a day (QID) | ORAL | Status: DC | PRN
  Filled 2012-11-04: qty 0.6

## 2012-11-04 MED ORDER — BOOST / RESOURCE BREEZE PO LIQD
1.0000 | Freq: Three times a day (TID) | ORAL | Status: DC
  Administered 2012-11-05: 1 via ORAL

## 2012-11-04 MED ORDER — TRAMADOL HCL 50 MG PO TABS
50.0000 mg | ORAL_TABLET | Freq: Four times a day (QID) | ORAL | Status: DC | PRN
  Administered 2012-11-04: 50 mg via ORAL
  Filled 2012-11-04 (×2): qty 1

## 2012-11-04 MED ORDER — MORPHINE SULFATE 2 MG/ML IJ SOLN: 1.0000 mg | INTRAMUSCULAR | Status: DC | PRN

## 2012-11-04 NOTE — Progress Notes (Signed)
[facility] PROGRESS NOTE  Miguel Swanson VHQ:[id number] DOB: 04-02-1926 DOA: 10/29/2012 PCP: Sanda Linger, MD- Also goes to [facility]  Assessment/Plan: Principal Problem:  *Hypothermia/altered level of consciousness Due to being outside and unresponsive for approximately 12-18 hours. Cause uncertain- a fall vs syncope vs TIA Temp now regulated  Fall versus syncope CT head did not reveal acute abnormalities-it does reveal moderate to severe chronic microvascular ischemic changes. Workup also included carotid Dopplers which only revealed 40-59% stenosis in the right carotid artery Echo: demonstrating severe decreased EF at 20-25%; grade 3 diastolic dysfunction; and diffuse hypokinesis. -Will follow closely for arrythmia; treat for secondary prevention for possible TIA and carotid stenosis. -Cardiology on board for heart failure tx; has recommended to start lisinopril due to low EF. -no signs of fluid overload.  Acute respiratory failure/multifocal pulmonary infiltrates suspected to be aspiration pneumonia -Possible aspiration while being unresponsive/ongoing dysphagia-he is being treated with Levaquin started on 1/26; will treat for a total of 10 days. -Currently asymptomatic is not requiring any O2, is afebrile and WBC's WNL.  Klebsiella bacteremia and PNA After discussing with the infectious disease service, decision and rec's is to treat for 6 days IV levaquin while inpatient and a subsequent problem therapy using Levaquin for 10 more days.  Dysphagia  -Abnormal MBS; plan is to treat esophageal candidiasis as found on EGD and to continue dys 3 diet and thin liquids qith close supervision. -continue PPI daily   Hypotension and bradycardia -Occurring on admission and suspected to be secondary to hypothermia -Resolved and asymptomatic. -Currently on beta blocker and with good HR  Reflux and weight loss and Fecal occult blood positive  40 pound weight loss starting  about a year ago but worse in the past few months -EGD demonstrating esophageal candidiasis -Continue PPI and now fluconazole -No other findings or abnormalities on EGD. -CT of abdomen and pelvis unable to be done at least for the next 3-4 days due to residual barium on his intestine from esophagogram   Systolic and diastolic heart failure EF 20-25% with severe diffuse hypokinesis of the LV Grade 3 diastolic dysfunction Moderate MR Euvolemic currently-Dr. Jacinto Halim following On digoxin and carvedilol; tolerating treatment well. Per cardiology recommendations started on lisinopril 20mg  daily; blood pressure tolerating so far new regimen.  Esophageal candidiasis -started on fluconazole (plan is to treat for a total 10 days). -Will follow GI biopsies  S/P CABG (coronary artery bypass graft) remote.   H/O abdominal aortic aneurysm repair, Open repair remote and July 2013 endograft placement at [facility], [place], [place]   Hyperlipidemia: continue statins  Deconditioning: per PT recommendations will benefit of SNF at discharge for conditioning.  Insomnia: continue restoril; the dose will be adjusted in order to help with his sleeping difficulties.  Code Status: Full code Family Communication: Grandson Disposition Plan: Likely SNF at discharge for conditioning and then home with son. DVT prophylaxis: Heparin   Consultants:  Cardiology  Procedures: L  TLC 1/25>>>  L radial a-line 1/25>>>  ET tube 1/25>>>  Antibiotics: Vanc 1/25>>>1/26  Zosyn 1/25>>>1/26  Levaquin 1/26>>> Fluconazole 1/30   HPI/Subjective: Afebrile; feeling overall better; no CP or SOB. Patient reports some difficulty sleeping and also with 2 episodes of loose stool. Reports some gas discomfort on his belly  Objective: Filed Vitals:   11/03/12 1400 11/03/12 1426 11/03/12 2022 11/04/12 0505  BP: 130/69 124/75 97/56 108/63  Pulse:  90 77 73  Temp:  97.4 F (36.3 C)  97.4 F (36.3 C)  TempSrc:  Oral   Axillary  Resp: 30 18 18 18   Height:      Weight:    71.215 kg (157 lb)  SpO2: 97% 98% 99% 94%    Intake/Output Summary (Last 24 hours) at 11/04/12 1348 Last data filed at 11/04/12 0835  Gross per 24 hour  Intake    960 ml  Output    601 ml  Net    359 ml    Exam:   General:  Alert oriented x3 no distress; afebrile  Cardiovascular: Regular rate and rhythm no murmurs  Respiratory: Clear to auscultation bilaterally  Abdomen: soft, non tender, nondistended and with positive bowel sounds   Ext: upper extremities on left side with frostbite from environmental exposure; no edema  Data Reviewed: Basic Metabolic Panel:  Lab 11/03/12 4782 11/01/12 1030 10/31/12 1320 10/31/12 0430 10/30/12 0418 10/29/12 1320  NA 142 142 135 138 137 --  K 4.9 3.9 3.6 2.8* 3.6 --  CL 108 106 101 103 99 --  CO2 18* 25 24 24 23  --  GLUCOSE 101* 113* 105* 96 139* --  BUN 31* 29* 37* 46* 61* --  CREATININE 0.96 1.04 1.08 1.25 2.03* --  CALCIUM 8.2* 8.4 7.9* 7.7* 8.3* --  MG -- -- -- 2.3 2.4 2.8*  PHOS -- -- -- 1.9* 6.5* 8.9*   Liver Function Tests:  Lab 10/29/12 1320 10/29/12 1012  AST 65* 49*  ALT 35 34  ALKPHOS 127* 137*  BILITOT 1.1 0.9  PROT 7.0 7.9  ALBUMIN 3.0* 3.4*   CBC:  Lab 11/03/12 0430 11/01/12 1030 10/31/12 0430 10/30/12 0418 10/29/12 1320 10/29/12 1012  WBC 9.7 9.8 12.3* 15.7* 22.3* --  NEUTROABS -- -- -- -- 19.8* 16.1*  HGB 10.8* 11.6* 10.2* 12.4* 12.4* --  HCT 32.2* 34.7* 29.6* 35.6* 35.5* --  MCV 89.7 90.1 87.1 88.3 89.4 --  PLT 144* 148* 123* 154 186 --   Cardiac Enzymes:  Lab 10/30/12 2210 10/30/12 1840 10/30/12 1223 10/30/12 1105 10/29/12 0932  CKTOTAL -- -- 1561* -- 660*  CKMB -- -- 8.8* -- 9.9*  CKMBINDEX -- -- -- -- --  TROPONINI 0.43* 0.49* 0.46* 0.51* --   BNP (last 3 results)  Basename 10/29/12 1320  PROBNP 21030.0*   CBG:  Lab 10/29/12 1403 10/29/12 1240  GLUCAP 85 97    Recent Results (from the past 240 hour(s))  CULTURE, BLOOD (ROUTINE X  2)     Status: Normal   Collection Time   10/29/12  1:00 PM      Component Value Range Status Comment   Specimen Description BLOOD CENTRAL LINE   Final    Special Requests BOTTLES DRAWN AEROBIC ONLY 10CC   Final    Culture  Setup Time 10/29/2012 23:09   Final    Culture NO GROWTH 5 DAYS   Final    Report Status 11/04/2012 FINAL   Final   CULTURE, BLOOD (ROUTINE X 2)     Status: Normal (Preliminary result)   Collection Time   10/29/12  1:10 PM      Component Value Range Status Comment   Specimen Description BLOOD RIGHT HAND   Final    Special Requests BOTTLES DRAWN AEROBIC AND ANAEROBIC 10CC   Final    Culture  Setup Time 10/29/2012 23:09   Final    Culture     Final    Value: GRAM NEGATIVE RODS        BLOOD CULTURE RECEIVED NO GROWTH TO DATE  CULTURE WILL BE HELD FOR 5 DAYS BEFORE ISSUING A FINAL NEGATIVE REPORT     Note: Gram Stain Report Called to,Read Back By and Verified With: SUSIE NIX ON 11/03/2012 AT 9:20P BY WILEJ   Report Status PENDING   Incomplete   CULTURE, RESPIRATORY     Status: Normal   Collection Time   10/29/12  3:40 PM      Component Value Range Status Comment   Specimen Description TRACHEAL ASPIRATE   Final    Special Requests NONE   Final    Gram Stain     Final    Value: NO WBC SEEN     NO SQUAMOUS EPITHELIAL CELLS SEEN     RARE GRAM NEGATIVE RODS   Culture MODERATE KLEBSIELLA PNEUMONIAE   Final    Report Status 11/01/2012 FINAL   Final    Organism ID, Bacteria KLEBSIELLA PNEUMONIAE   Final   MRSA PCR SCREENING     Status: Normal   Collection Time   10/29/12  5:41 PM      Component Value Range Status Comment   MRSA by PCR NEGATIVE  NEGATIVE Final   URINE CULTURE     Status: Normal   Collection Time   10/29/12  7:44 PM      Component Value Range Status Comment   Specimen Description URINE, CATHETERIZED   Final    Special Requests NONE   Final    Culture  Setup Time 10/30/2012 01:50   Final    Colony Count NO GROWTH   Final    Culture NO GROWTH   Final     Report Status 10/31/2012 FINAL   Final   CLOSTRIDIUM DIFFICILE BY PCR     Status: Normal   Collection Time   11/02/12  1:49 PM      Component Value Range Status Comment   C difficile by pcr NEGATIVE  NEGATIVE Final      Studies: Dg Chest 2 View  10/31/2012  *RADIOLOGY REPORT*  Clinical Data: Shortness of breath, congestion, possible aspiration pneumonia  CHEST - 2 VIEW  Comparison: 10/30/2012  Findings: Multifocal patchy opacity in the bilateral upper lobes and right lower lobe, new.  Differential considerations include aspiration (given history) or possibly interstitial edema (given rapid change).  Cardiomegaly. Postsurgical changes related to prior CABG.  Thoracic aortic stent.  Degenerative changes of the visualized thoracolumbar spine.  IMPRESSION: Multifocal patchy opacities, new, favored to reflect aspiration or possibly interstitial edema.   Original Report Authenticated By: Charline Bills, M.D.    Ct Head Wo Contrast  10/29/2012  *RADIOLOGY REPORT*  Clinical Data:  The patient was found unresponsive outside his home earlier today and is suffering from hypothermia.  CT HEAD WITHOUT CONTRAST CT CERVICAL SPINE WITHOUT CONTRAST  Technique:  Multidetector CT imaging of the head and cervical spine was performed following the standard protocol without intravenous contrast.  Multiplanar CT image reconstructions of the cervical spine were also generated.  Comparison:  None.  CT HEAD  Findings: Severe cortical and deep atrophy and moderate cerebellar atrophy.  Moderate to severe changes of small vessel disease of the white matter diffusely.  Physiologic calcifications in the basal ganglia.  No mass lesion.  No midline shift.  No acute hemorrhage or hematoma.  No extra-axial fluid collections.  No evidence of acute infarction.  No skull fracture or other focal osseous abnormality involving the skull.  Visualized paranasal sinuses, bilateral mastoid air cells, and bilateral middle ear cavities  well-aerated.  Extensive  bilateral carotid siphon and left vertebral artery atherosclerosis.  IMPRESSION:  1.  No acute intracranial abnormality. 2.  Moderate to severe generalized atrophy and moderate to severe chronic microvascular ischemic changes of the white matter.  CT CERVICAL SPINE  Findings: No fractures identified involving the cervical spine. Exaggeration of the usual cervical lordosis on the sagittal reconstructed images is likely due to an exaggerated thoracic kyphosis.  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  Disc space narrowing and associated endplate hypertrophic changes at C3-4, C5-6, C6-7, and to a lesser degree C7-T1.  No spinal stenosis.  Facet joints intact with diffuse degenerative changes.  Coronal reformatted images demonstrate an intact craniocervical junction, intact C1-C2 articulation, intact dens, and intact lateral masses.  Combination of uncinate and facet hypertrophy account for multilevel foraminal stenoses including severe right and mild left C3-4, moderate right C4-5, mild right C5-6, mild left C6-7.  Note is made of emphysematous changes in the visualized lung apices.  IMPRESSION:  1.  No cervical spine fracture identified.  2.  Multilevel degenerative disc disease, spondylosis, and facet degenerative changes with multilevel foraminal stenoses as detailed above. 3.  Emphysematous changes in the visualized lung apices.   Original Report Authenticated By: Hulan Saas, M.D.    Ct Cervical Spine Wo Contrast  10/29/2012  *RADIOLOGY REPORT*  Clinical Data:  The patient was found unresponsive outside his home earlier today and is suffering from hypothermia.  CT HEAD WITHOUT CONTRAST CT CERVICAL SPINE WITHOUT CONTRAST  Technique:  Multidetector CT imaging of the head and cervical spine was performed following the standard protocol without intravenous contrast.  Multiplanar CT image reconstructions of the cervical spine were also generated.   Comparison:  None.  CT HEAD  Findings: Severe cortical and deep atrophy and moderate cerebellar atrophy.  Moderate to severe changes of small vessel disease of the white matter diffusely.  Physiologic calcifications in the basal ganglia.  No mass lesion.  No midline shift.  No acute hemorrhage or hematoma.  No extra-axial fluid collections.  No evidence of acute infarction.  No skull fracture or other focal osseous abnormality involving the skull.  Visualized paranasal sinuses, bilateral mastoid air cells, and bilateral middle ear cavities well-aerated.  Extensive bilateral carotid siphon and left vertebral artery atherosclerosis.  IMPRESSION:  1.  No acute intracranial abnormality. 2.  Moderate to severe generalized atrophy and moderate to severe chronic microvascular ischemic changes of the white matter.  CT CERVICAL SPINE  Findings: No fractures identified involving the cervical spine. Exaggeration of the usual cervical lordosis on the sagittal reconstructed images is likely due to an exaggerated thoracic kyphosis.  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  Disc space narrowing and associated endplate hypertrophic changes at C3-4, C5-6, C6-7, and to a lesser degree C7-T1.  No spinal stenosis.  Facet joints intact with diffuse degenerative changes.  Coronal reformatted images demonstrate an intact craniocervical junction, intact C1-C2 articulation, intact dens, and intact lateral masses.  Combination of uncinate and facet hypertrophy account for multilevel foraminal stenoses including severe right and mild left C3-4, moderate right C4-5, mild right C5-6, mild left C6-7.  Note is made of emphysematous changes in the visualized lung apices.  IMPRESSION:  1.  No cervical spine fracture identified.  2.  Multilevel degenerative disc disease, spondylosis, and facet degenerative changes with multilevel foraminal stenoses as detailed above. 3.  Emphysematous changes in the visualized  lung apices.   Original Report  Authenticated By: Hulan Saas, M.D.    Dg Pelvis Portable  10/29/2012  *RADIOLOGY REPORT*  Clinical Data: Found unresponsive earlier this morning outside. Hypothermia.  PORTABLE PELVIS AP VIEW 10/29/2012 1032 hours:  Comparison: None.  Findings: No acute fractures involving the pelvis or either proximal femur.  Symmetric mild axial joint space narrowing in both hips.  Sacroiliac joints and symphysis pubis intact.  Degenerative changes involving the visualized lower lumbar spine.  IMPRESSION: No acute or significant osseous abnormality for age.   Original Report Authenticated By: Hulan Saas, M.D.    Dg Chest Port 1 View  10/30/2012  *RADIOLOGY REPORT*  Clinical Data: Ventilator dependent respiratory failure. Hypothermia.  PORTABLE CHEST - 1 VIEW 10/30/2012 0519 hours:  Comparison: Portable chest x-rays yesterday.  Findings: Endotracheal tube tip remains in satisfactory position projecting approximately 5 cm above the carina.  Nasogastric tube courses below the diaphragm into the stomach.  Left subclavian central venous catheter tip remains injecting over the SVC. Thoracic aortic stent graft.  Cardiac silhouette enlarged but stable.  Interval improvement in the interstitial and airspace pulmonary edema since yesterday, with minimal interstitial edema persisting.  Mild atelectasis in the lung bases related to a less than optimal inspiration.  No new pulmonary parenchymal abnormalities.  IMPRESSION: Support apparatus satisfactory.  Improved pulmonary edema, with only minimal interstitial edema persisting.  Suboptimal inspiration accounts for bibasilar atelectasis.  No new abnormalities.   Original Report Authenticated By: Hulan Saas, M.D.    Dg Chest Portable 1 View  10/29/2012  *RADIOLOGY REPORT*  Clinical Data: The patient was found unresponsive outside his home earlier today and is suffering from hypothermia.  Bedside central venous catheter placement.  PORTABLE  CHEST - 1 VIEW 10/29/2012 1242 hours:  Comparison: Portable chest x-ray earlier same date 1028 hours.  Findings: Left subclavian central venous catheter tip projects over the lower SVC.  No evidence of pneumothorax mediastinal hematoma. Cardiac silhouette markedly enlarged but stable.  Mild diffuse interstitial and airspace pulmonary edema which has progressed since earlier in the day.  Prior thoracic aortic stent graft. Endotracheal tube tip remains in satisfactory position approximately 5 cm above the carina.  IMPRESSION:  1.  Left subclavian central venous catheter tip projects over the lower SVC.  No acute complicating features. 2.  Remaining support apparatus satisfactory. 3.  Developing interstitial and airspace opacities throughout both lungs, likely pulmonary edema.  Query fluid overload.   Original Report Authenticated By: Hulan Saas, M.D.    Dg Chest Portable 1 View  10/29/2012  *RADIOLOGY REPORT*  Clinical Data: Acute respiratory failure.  Hypothermia. Unresponsive.  PORTABLE CHEST - 1 VIEW  Comparison: None.  Findings: Endotracheal tube is seen with tip approximately 4 cm above carina.  Moderate cardiomegaly is seen.  Both lungs are clear.  No evidence of pneumothorax or pleural effusion.  Ectasia of the thoracic aorta is noted.  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  IMPRESSION: Moderate cardiomegaly.  No active lung disease.   Original Report Authenticated By: Myles Rosenthal, M.D.    Dg Hand Complete Left  10/31/2012  *RADIOLOGY REPORT*  Clinical Data: Pain and swelling in the left hand.  Evaluate for fracture.  LEFT HAND - COMPLETE 3+ VIEW  Comparison: No priors.  Findings: Three views of the left hand demonstrate marked soft tissue swelling overlying the metacarpals.  There is mild irregularity of the fifth metacarpal, suggesting an old healed fracture.  No acute displaced fracture, subluxation or dislocation is noted.  Degenerative  changes of  osteoarthritis are noted, most severe at the first [facility] joint where there is severe joint space narrowing, subchondral sclerosis, subchondral cyst formation and osteophyte formation.  IMPRESSION: 1.  Soft tissue swelling in the hand, particularly in the region of the metacarpals, without acute bony abnormality. 2.  Old healed fifth metacarpal fracture. 3.  Degenerative changes of osteoarthritis, as above.   Original Report Authenticated By: Trudie Reed, M.D.     Scheduled Meds:    . atorvastatin  40 mg Oral QPM  . carvedilol  3.125 mg Oral BID WC  . digoxin  0.125 mg Oral Daily  . feeding supplement  1 Container Oral TID BM  . fluconazole (DIFLUCAN) IV  200 mg Intravenous Q24H  . levofloxacin  750 mg Oral Daily  . lisinopril  20 mg Oral Daily  . multivitamin with minerals  1 tablet Oral Daily  . pantoprazole  40 mg Oral Q1200  . temazepam  15 mg Oral QHS   Continuous Infusions:   ________________________________________________________________________  Time spent in minutes: 30    Sol Odor  [facility] Pager [phone] If 8PM-8AM, please contact night-coverage at [email or web address], password [facility] 11/04/2012, 1:48 PM  LOS: 6 days

## 2012-11-04 NOTE — Progress Notes (Signed)
Patient seen, examined, and I agree with the above documentation, including the assessment and plan. Ongoing treatment for candida esophagitis, which I expect will help swallowing difficulty some, but not totally.  He def has dysmotility which has no good treatment. Gastric polyp is hyperplastic without dysplasia, thus given other acute issues and chronic comorbidities, I do not think this will require further intervention or surveillance. CT scan abd/pelvis per primary team in further eval of weight loss and abd pain, though I expect mesenteric insufficiency may be contributing. Call with questions.

## 2012-11-04 NOTE — Progress Notes (Signed)
Physical Therapy Treatment Patient Details Name: Miguel Swanson MRN: [id number] DOB: 05-20-1926 Today's Date: 11/04/2012 Time: 7829-5621 PT Time Calculation (min): 25 min  PT Assessment / Plan / Recommendation Comments on Treatment Session  Pt s/p hypothermia and VDRF after fall.  Will benefit from continued PT to address balance, endurance and safety issues.  Continue to recommend therapy post acute at SNF so pattient can retrurn to prior functional level.      Follow Up Recommendations  SNF;Supervision/Assistance - 24 hour                 Equipment Recommendations  None recommended by PT        Frequency Min 3X/week   Plan Discharge plan remains appropriate;Frequency remains appropriate    Precautions / Restrictions Precautions Precautions: Fall Restrictions Weight Bearing Restrictions: No   Pertinent Vitals/Pain VSS, No pain    Mobility  Bed Mobility Bed Mobility: Rolling Right;Right Sidelying to Sit;Sitting - Scoot to Edge of Bed Rolling Right: 4: Min guard Right Sidelying to Sit: HOB elevated;4: Min guard Sitting - Scoot to [place] of Bed: 4: Min guard Details for Bed Mobility Assistance: Incr time and cuing needed.  No assist today. Transfers Transfers: Sit to Stand;Stand to Sit Sit to Stand: With upper extremity assist;With armrests;From bed;4: Min assist Stand to Sit: 4: Min guard;With upper extremity assist;With armrests;To chair/3-in-1 Stand Pivot Transfers: Not tested (comment) Details for Transfer Assistance: Pt needed cues for hand placement.  Pt had no LOB with ambulation with RW but continues to need cues for safety.  Needed cues to stay close to RW and for occasional sequencing of steps.   Ambulation/Gait Ambulation/Gait Assistance: 4: Min assist Ambulation Distance (Feet): 200 Feet Assistive device: Rolling walker Ambulation/Gait Assistance Details: Pt continues with unequal step length at times which causes decr coordination of movement.  Did not cause  LOB to where pt needed assist but pt needed cues for noticing that his steps were off and could lead to falls.  Demonstrates very poor postural stability and poor balance reactions.   Gait Pattern: Step-through pattern;Decreased stride length;Trunk flexed;Narrow base of support Gait velocity: decreased Stairs: No Wheelchair Mobility Wheelchair Mobility: No    Exercises General Exercises - Lower Extremity Long Arc Quad: AROM;Both;10 reps;Seated Heel Slides: AROM;Both;10 reps;Seated    PT Goals Acute Rehab PT Goals PT Goal: Supine/Side to Sit - Progress: Progressing toward goal PT Goal: Sit to Stand - Progress: Progressing toward goal PT Transfer Goal: Bed to Chair/Chair to Bed - Progress: Progressing toward goal PT Goal: Ambulate - Progress: Progressing toward goal  Visit Information  Last PT Received On: 11/04/12 Assistance Needed: +1    Subjective Data  Subjective: "I still feel weak."   Cognition  Overall Cognitive Status: Impaired Area of Impairment: Safety/judgement;Awareness of deficits Arousal/Alertness: Awake/alert Orientation Level: Appears intact for tasks assessed Behavior During Session: [facility] for tasks performed Safety/Judgement: Decreased safety judgement for tasks assessed Safety/Judgement - Other Comments: Safety awareness is improving    Balance  Static Standing Balance Static Standing - Balance Support: Bilateral upper extremity supported;During functional activity Static Standing - Level of Assistance: 5: Stand by assistance Static Standing - Comment/# of Minutes: Can stand with RW staticaly and not need steadying assist.  Dynamic Standing Balance Dynamic Standing - Balance Support: Bilateral upper extremity supported;During functional activity Dynamic Standing - Level of Assistance: 4: Min assist Dynamic Standing - Balance Activities: Lateral lean/weight shifting;Forward lean/weight shifting;Reaching for objects Dynamic Standing - Comments: Needs min  assist if  given challenges while holding RW.  Without RW, needs mod assist with challenges.   End of Session PT - End of Session Equipment Utilized During Treatment: Gait belt Activity Tolerance: Patient tolerated treatment well Patient left: in chair;with call bell/phone within reach;with chair alarm set Nurse Communication: Mobility status        INGOLD,Ayza Ripoll 11/04/2012, 11:22 AM  [facility] [phone] (pager)

## 2012-11-04 NOTE — Progress Notes (Signed)
Pt frustrated and states "something must really be wrong if I only sleep 10 min and wake up". States he has not been sleeping at all since admission and has complained several times and "no one seems to be listening". Pt given 15mg  Restoril before bed last pm, but states it did not work. States he was taking half a Xanax before bed at home, and just prior to admission, he started having trouble sleeping even after taking medication. Pt repositioned several times and frequently checked on throughout the night. Pt states he "wasn't really sleeping" when RN/NT entered room. Pt currently in chair resting. MD, please address sleeping issue with pt.   Will continue to monitor.

## 2012-11-04 NOTE — Progress Notes (Signed)
      Gi Daily Rounding Note 11/04/2012, 8:31 AM  SUBJECTIVE:       Troubles with insomnia.  This was also an outpt problem.  1 or 2 loose stools yesterday. C diff is negative.  Says ensure causes him to get loose stools.  OBJECTIVE:         Vital signs in last 24 hours:    Temp:  [97.4 F (36.3 C)-98 F (36.7 C)] 97.4 F (36.3 C) (01/31 0505) Pulse Rate:  [73-90] 73  (01/31 0505) Resp:  [18-36] 18  (01/31 0505) BP: (97-133)/(48-82) 108/63 mmHg (01/31 0505) SpO2:  [94 %-100 %] 94 % (01/31 0505) Weight:  [71.215 kg (157 lb)] 71.215 kg (157 lb) (01/31 0505) Last BM Date: 11/02/12 General: frail, elderly.  comfortable   Heart: RRR Chest: cough, slight dyspnea. Abdomen: soft, NT,ND  Active BS  Extremities: no pedal edema Neuro/Psych:  Oriented x 3.    Intake/Output from previous day: 01/30 0701 - 01/31 0700 In: 720 [P.O.:720] Out: 601 [Urine:600; Stool:1]  Intake/Output this shift:    Lab Results: C diff negative.   Blood culture on 1 of 2 blood specs as well as from resp culture. : growing Klebsiella   Stool C Diff negative.   Dr Gwenlyn Perking ordered abdominal/pelvic CT:  Not yet performed due to presence of esophagram  Contrast.    ASSESMENT: *  Dysphagia: secondary to dysmotility (confirmed on esophagram).  EGD1/30: esophageal candidiasis, gastric polyp (biopsied).  *   PVD.  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 *   Klebsiella positive blood and sputum culture.  On Levaquin  PLAN: *  Follow up on pathology/bx.  Continue Diflucan of 10 days treatment, once daily PPI.  Swallowing precautions and Dysphagia 3 diet per SLP *  If has problematic loose stools, could switch from Protonix/PPI to Pepcid or other H2 blocker as PPI can cause loose stools.  Also may need to stop Ensure pudding.  At home [facility] Instant breakfast powder mixed with milk might be best option for supplement.    LOS: 6 days   Jennye Moccasin  11/04/2012, 8:31 AM Pager: [phone]

## 2012-11-04 NOTE — Clinical Social Work Note (Signed)
CSW confirmed that SNF bed ready over weekend at [facility]. MD and family both agreeable to SNF at [place] once medically cleared. Weekend CSW will f/u to facilitate dc.    Frederico Hamman, LCSW [phone]

## 2012-11-04 NOTE — Progress Notes (Signed)
Pt HR dropped to 34, non-sustained, and returned to NSR in the 60's. Pt resting, will continue to monitor closely.

## 2012-11-05 LAB — CULTURE, BLOOD (ROUTINE X 2)

## 2012-11-05 MED ORDER — ALPRAZOLAM 0.5 MG PO TABS: 0.2500 mg | ORAL_TABLET | Freq: Three times a day (TID) | ORAL | Status: DC

## 2012-11-05 MED ORDER — FLUCONAZOLE 100 MG PO TABS: 200.0000 mg | ORAL_TABLET | Freq: Every day | ORAL | Status: AC

## 2012-11-05 MED ORDER — LISINOPRIL 20 MG PO TABS: 20.0000 mg | ORAL_TABLET | Freq: Every day | ORAL | Status: DC

## 2012-11-05 MED ORDER — BOOST / RESOURCE BREEZE PO LIQD: 1.0000 | Freq: Three times a day (TID) | ORAL | Status: DC

## 2012-11-05 MED ORDER — CARVEDILOL 3.125 MG PO TABS: 3.1250 mg | ORAL_TABLET | Freq: Two times a day (BID) | ORAL | Status: DC

## 2012-11-05 MED ORDER — LEVOFLOXACIN 750 MG PO TABS: 750.0000 mg | ORAL_TABLET | Freq: Every day | ORAL | Status: AC

## 2012-11-05 MED ORDER — TRAMADOL HCL 50 MG PO TABS: 50.0000 mg | ORAL_TABLET | Freq: Three times a day (TID) | ORAL | Status: DC | PRN

## 2012-11-05 MED ORDER — ADULT MULTIVITAMIN W/MINERALS CH: 1.0000 | ORAL_TABLET | Freq: Every day | ORAL | Status: AC

## 2012-11-05 MED ORDER — TEMAZEPAM 30 MG PO CAPS: 30.0000 mg | ORAL_CAPSULE | Freq: Every day | ORAL | Status: DC

## 2012-11-05 MED ORDER — DIGOXIN 125 MCG PO TABS: 0.1250 mg | ORAL_TABLET | Freq: Every day | ORAL | Status: DC

## 2012-11-05 NOTE — Progress Notes (Signed)
Clinical Social Work Department CLINICAL SOCIAL WORK PLACEMENT NOTE 11/05/2012  Patient:  Miguel Swanson, Miguel Swanson  Account Number:  [id number] Admit date:  10/29/2012  Clinical Social Worker:  Frederico Hamman, LCSW  Date/time:  11/02/2012 12:00 M  Clinical Social Work is seeking post-discharge placement for this patient at the following level of care:   SKILLED NURSING   (*CSW will update this form in Epic as items are completed)   11/02/2012  Patient/family provided with [facility] Department of Clinical Social Work's list of facilities offering this level of care within the geographic area requested by the patient (or if unable, by the patient's family).  11/02/2012  Patient/family informed of their freedom to choose among providers that offer the needed level of care, that participate in Medicare, Medicaid or managed care program needed by the patient, have an available bed and are willing to accept the patient.  11/02/2012  Patient/family informed of [facility]' ownership interest in [facility], as well as of the fact that they are under no obligation to receive care at this facility.  PASARR submitted to EDS on 11/04/2012 PASARR number received from EDS on 11/04/2012  FL2 transmitted to all facilities in geographic area requested by pt/family on  11/02/2012 FL2 transmitted to all facilities within larger geographic area on   Patient informed that his/her managed care company has contracts with or will negotiate with  certain facilities, including the following:     Patient/family informed of bed offers received:  11/03/2012 Patient chooses bed at [facility], [place] Physician recommends and patient chooses bed at    Patient to be transferred to [facility], [place] on  11/05/2012 Patient to be transferred to facility by [facility]  The following physician request were entered in Epic:   Additional Comments:

## 2012-11-05 NOTE — Progress Notes (Signed)
Clinical Social Work  CSW spoke with Debbie at [facility] who is agreeable to admission today. CSW sent DC summary and prepared dc packet. CSW informed patient, son and RN of dc and all parties agreeable. CSW coordinated transportation via [facility] at 1330. CSW is signing off but available if needed.  [place], [place] [phone]

## 2012-11-05 NOTE — Progress Notes (Signed)
Speech Language Pathology Dysphagia Treatment Patient Details Name: Miguel Swanson MRN: [id number] DOB: [birth year] Today's Date: 11/05/2012 Time: 4098-1191 SLP Time Calculation (min): 21 min  Assessment / Plan / Recommendation Clinical Impression  Follow up from [facility] 1/29 for adherence and safety with diet/liquids.  Pt. had barium esophagram with report revealing a nonspecific esophageal motility disorder with somewhat patulous appearance of the esophagus, no convincing stricture or mass lesion is evident.  Pt. underwent EGD showing white exudates consistent with candidiasis Semi-pedunculated polyp measuring 1 cm in size was found on the lesser curvature of the gastric body; biopsy performed.  SLP reviewed and discussed esophageal precautions in detail and answered pt.'s questions.  Pt. required min-mod verbal/visual cues for improved accuracy implementing the chin tuck and 2nd swallow.  Delayed throat clears present and intermittent wet vocal quality that pt. also has at baseline that appears to be decreased than previously observed.  Pt. plans for transfer soon to SNF.  Recommend continued ST for dysphagia.       Diet Recommendation  Continue with Current Diet: Dysphagia 3 (mechanical soft);Thin liquid    SLP Plan Continue with current plan of care   Pertinent Vitals/Pain none   Swallowing Goals  SLP Swallowing Goals Patient will utilize recommended strategies during swallow to increase swallowing safety with: Moderate cueing Swallow Study Goal #2 - Progress: Progressing toward goal Goal #3: Pt. will consume safest diet texture/po's adhering to swallow/esophageal precautions with min verbal cues.  Swallow Study Goal #3 - Progress: Progressing toward goal  General Temperature Spikes Noted: No Respiratory Status: Room air Behavior/Cognition: Alert;Cooperative;Pleasant mood;Hard of hearing Oral Cavity - Dentition: Dentures, top;Dentures, bottom Patient Positioning: Upright in chair  Oral  Cavity - Oral Hygiene Does patient have any of the following "at risk" factors?: Nutritional status - inadequate Brush patient's teeth BID with toothbrush (using toothpaste with fluoride): Yes Patient is HIGH RISK - Oral Care Protocol followed (see row info): Yes   Dysphagia Treatment Treatment focused on: Skilled observation of diet tolerance;Patient/family/caregiver education Treatment Methods/Modalities: Skilled observation Patient observed directly with PO's: Yes Type of PO's observed: Thin liquids Feeding: Able to feed self Liquids provided via: Cup Pharyngeal Phase Signs & Symptoms: Delayed throat clear Type of cueing: Verbal;Visual Amount of cueing: Moderate        Breck Coons [place].Ed [employer] [phone]  11/05/2012

## 2012-11-05 NOTE — Discharge Summary (Signed)
Physician Discharge Summary  Miguel Swanson:[id number] DOB: [birth year] DOA: 10/29/2012  PCP: Sanda Linger, MD  Admit date: 10/29/2012 Discharge date: 11/05/2012  Time spent: >30 minutes  Recommendations for Outpatient Follow-up:  Cbc to follow Hgb BMET to follow electrolytes and kidney function  Discharge Diagnoses:  Principal Problem:  *Hypothermia Active Problems:  Acute respiratory failure  Hypotension  Altered mental status  Sepsis  Bradycardia  S/P CABG (coronary artery bypass graft) remote.  H/O abdominal aortic aneurysm repair, Open repair remote and July 2013 endograft placement at [facility], [place], [place]  Hyperlipidemia  Heme positive stool  Weight loss  Combined systolic and diastolic heart failure  Esophageal candidiasis   Discharge Condition: stable and improved. Will discharge to SNF for physical rehabilitation.   Diet recommendation: dysphagia 3 diet, thin liquids, heart healthy  Filed Weights   11/03/12 0458 11/04/12 0505 11/05/12 0540  Weight: 67.5 kg (148 lb 13 oz) 71.215 kg (157 lb) 72.9 kg (160 lb 11.5 oz)    History of present illness:  79 year old male with unknown PMH, found down by neighbor outside. EMS was called and patient was brought to the ED and patient was intubated. Was found to be hypothermic. PCCM was called to admit after patient was intubated. No other history is available, patient is unconscious and no family is available  Hospital Course:  Early sepsis due to klebsiella with Hypothermia/altered level of consciousness  Due to infection and being outside and unresponsive for approximately 12-18 hours.  Final cause uncertain- a fall vs syncope vs TIA vs infection.  Temp now regulated and patient BP well controlled.  Fall versus syncope  CT head did not reveal acute abnormalities-it does reveal moderate to severe chronic microvascular ischemic changes.  Workup also included carotid Dopplers which only revealed 40-59% stenosis in the  right carotid artery  Echo: demonstrating severe decreased EF at 20-25%; grade 3 diastolic dysfunction; and diffuse hypokinesis.  -Will follow closely for arrythmia; treat for secondary prevention for possible TIA and carotid stenosis.  -Cardiology on board for heart failure tx; has recommended to start lisinopril due to low EF.  -no signs of fluid overload at discharge.  Acute respiratory failure/multifocal pulmonary infiltrates suspected to be aspiration pneumonia  -Possible aspiration while being unresponsive/ongoing dysphagia-he is being treated with Levaquin started on 1/26; will treat for a total of 10 days.  -Currently asymptomatic is not requiring any O2, is afebrile and WBC's WNL.   Klebsiella bacteremia and PNA  After discussing with the infectious disease service, decision was to treat for a total of 6 days IV levaquin while inpatient and a subsequent oral therapy using Levaquin for 2 weeks.  Dysphagia  -Abnormal MBS; plan is to treat esophageal candidiasis as found on EGD and to continue dys 3 diet and thin liquids with close supervision.  -continue PPI daily   Hypotension and bradycardia  -Occurring on admission and suspected to be secondary to hypothermia  -Resolved and asymptomatic.  -Currently on beta blocker and with good HR   Reflux and weight loss and Fecal occult blood positive  40 pound weight loss starting about a year ago but worse in the past few months  -EGD demonstrating esophageal candidiasis  -Continue PPI and now fluconazole  -No other findings or abnormalities on EGD.   Systolic and diastolic heart failure  EF 20-25% with severe diffuse hypokinesis of the LV  Grade 3 diastolic dysfunction  Moderate MR  Euvolemic currently-Dr. Jacinto Halim will follow as an outpatient Continue  digoxin, carvedilol and lisonopril.   Esophageal candidiasis  -started on fluconazole (plan is to treat for a total 10 days).  -Follow GI biopsies  -continue PPI  S/P CABG (coronary  artery bypass graft) remote.  H/O abdominal aortic aneurysm repair, Open repair remote and July 2013 endograft placement at [facility], [place], [place] Continue outpatient follow up.  Hyperlipidemia: continue statins   Deconditioning: per PT recommendations will benefit of SNF at discharge for conditioning.   Insomnia: continue restoril; the dose adjusted in order to help with his sleeping difficulties.   Procedures: EGD (no signs or source of bleeding; positive esophageal candidiasis)   Consultations:  Cardiology  GI  Discharge Exam: Filed Vitals:   11/04/12 1352 11/04/12 2048 11/04/12 2258 11/05/12 0540  BP: 108/67 107/67 106/61 113/60  Pulse: 79  64 65  Temp: 97.8 F (36.6 C) 97.3 F (36.3 C)  97.3 F (36.3 C)  TempSrc: Oral Oral  Axillary  Resp: 20 18  18   Height:      Weight:    72.9 kg (160 lb 11.5 oz)  SpO2: 97% 94% 95% 97%   General: Alert oriented x3 no distress; afebrile  Cardiovascular: Regular rate and rhythm no murmurs  Respiratory: Clear to auscultation bilaterally  Abdomen: soft, non tender, nondistended and with positive bowel sounds  Ext: upper extremities on left side with frostbite from environmental exposure; no edema  Discharge Instructions  Discharge Orders    Future Orders Please Complete By Expires   Diet - low sodium heart healthy      Discharge instructions      Comments:   -Keep patient well hydrated -arrange follow up with cardiologist in 2 weeks -Take medications as prescribed -PT/OT as per SNF program.       Medication List     As of 11/05/2012 11:54 AM    STOP taking these medications         hydrochlorothiazide 25 MG tablet   Commonly known as: HYDRODIURIL      TAKE these medications         ALPRAZolam 0.5 MG tablet   Commonly known as: XANAX   Take 0.5 tablets (0.25 mg total) by mouth 3 (three) times daily.      aspirin EC 81 MG tablet   Take 81 mg by mouth daily.      atorvastatin 40 MG tablet   Commonly known as:  LIPITOR   Take 40 mg by mouth every evening.      carvedilol 3.125 MG tablet   Commonly known as: COREG   Take 1 tablet (3.125 mg total) by mouth 2 (two) times daily with a meal.      digoxin 0.125 MG tablet   Commonly known as: LANOXIN   Take 1 tablet (0.125 mg total) by mouth daily.      feeding supplement Liqd   Take 1 Container by mouth 3 (three) times daily between meals.      Fish Oil 1200 MG Caps   Take 1 capsule by mouth daily.      fluconazole 100 MG tablet   Commonly known as: DIFLUCAN   Take 2 tablets (200 mg total) by mouth daily.      levofloxacin 750 MG tablet   Commonly known as: LEVAQUIN   Take 1 tablet (750 mg total) by mouth daily. For 14 days      lisinopril 20 MG tablet   Commonly known as: PRINIVIL,ZESTRIL   Take 1 tablet (20 mg total) by mouth  daily.      multivitamin with minerals Tabs   Take 1 tablet by mouth daily.      omeprazole 20 MG capsule   Commonly known as: PRILOSEC   Take 20 mg by mouth daily.      polyethylene glycol packet   Commonly known as: MIRALAX / GLYCOLAX   Take 17 g by mouth daily.      ranitidine 150 MG tablet   Commonly known as: ZANTAC   Take 150 mg by mouth 2 (two) times daily.      rOPINIRole 0.25 MG tablet   Commonly known as: REQUIP   Take 0.25 mg by mouth at bedtime.      rosuvastatin 20 MG tablet   Commonly known as: CRESTOR   Take 20 mg by mouth at bedtime.      temazepam 30 MG capsule   Commonly known as: RESTORIL   Take 1 capsule (30 mg total) by mouth at bedtime.      traMADol 50 MG tablet   Commonly known as: ULTRAM   Take 1 tablet (50 mg total) by mouth every 8 (eight) hours as needed. For pain           Follow-up Information    Follow up with Pamella Pert, MD. Schedule an appointment as soon as possible for a visit in 2 weeks.   Contact information:   [address] [place] [address] [phone]       Follow up with Sanda Linger, MD. Schedule an appointment as soon  as possible for a visit in 14 days.   Contact information:   [address] [place] [address] [phone]           The results of significant diagnostics from this hospitalization (including imaging, microbiology, ancillary and laboratory) are listed below for reference.    Significant Diagnostic Studies: Dg Chest 2 View  10/31/2012  *RADIOLOGY REPORT*  Clinical Data: Shortness of breath, congestion, possible aspiration pneumonia  CHEST - 2 VIEW  Comparison: 10/30/2012  Findings: Multifocal patchy opacity in the bilateral upper lobes and right lower lobe, new.  Differential considerations include aspiration (given history) or possibly interstitial edema (given rapid change).  Cardiomegaly. Postsurgical changes related to prior CABG.  Thoracic aortic stent.  Degenerative changes of the visualized thoracolumbar spine.  IMPRESSION: Multifocal patchy opacities, new, favored to reflect aspiration or possibly interstitial edema.   Original Report Authenticated By: Charline Bills, M.D.    Ct Head Wo Contrast  10/29/2012  *RADIOLOGY REPORT*  Clinical Data:  The patient was found unresponsive outside his home earlier today and is suffering from hypothermia.  CT HEAD WITHOUT CONTRAST CT CERVICAL SPINE WITHOUT CONTRAST  Technique:  Multidetector CT imaging of the head and cervical spine was performed following the standard protocol without intravenous contrast.  Multiplanar CT image reconstructions of the cervical spine were also generated.  Comparison:  None.  CT HEAD  Findings: Severe cortical and deep atrophy and moderate cerebellar atrophy.  Moderate to severe changes of small vessel disease of the white matter diffusely.  Physiologic calcifications in the basal ganglia.  No mass lesion.  No midline shift.  No acute hemorrhage or hematoma.  No extra-axial fluid collections.  No evidence of acute infarction.  No skull fracture or other focal osseous abnormality  involving the skull.  Visualized paranasal sinuses, bilateral mastoid air cells, and bilateral middle ear cavities well-aerated.  Extensive bilateral carotid siphon and  left vertebral artery atherosclerosis.  IMPRESSION:  1.  No acute intracranial abnormality. 2.  Moderate to severe generalized atrophy and moderate to severe chronic microvascular ischemic changes of the white matter.  CT CERVICAL SPINE  Findings: No fractures identified involving the cervical spine. Exaggeration of the usual cervical lordosis on the sagittal reconstructed images is likely due to an exaggerated thoracic kyphosis.  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  Disc space narrowing and associated endplate hypertrophic changes at C3-4, C5-6, C6-7, and to a lesser degree C7-T1.  No spinal stenosis.  Facet joints intact with diffuse degenerative changes.  Coronal reformatted images demonstrate an intact craniocervical junction, intact C1-C2 articulation, intact dens, and intact lateral masses.  Combination of uncinate and facet hypertrophy account for multilevel foraminal stenoses including severe right and mild left C3-4, moderate right C4-5, mild right C5-6, mild left C6-7.  Note is made of emphysematous changes in the visualized lung apices.  IMPRESSION:  1.  No cervical spine fracture identified.  2.  Multilevel degenerative disc disease, spondylosis, and facet degenerative changes with multilevel foraminal stenoses as detailed above. 3.  Emphysematous changes in the visualized lung apices.   Original Report Authenticated By: Hulan Saas, M.D.    Ct Cervical Spine Wo Contrast  10/29/2012  *RADIOLOGY REPORT*  Clinical Data:  The patient was found unresponsive outside his home earlier today and is suffering from hypothermia.  CT HEAD WITHOUT CONTRAST CT CERVICAL SPINE WITHOUT CONTRAST  Technique:  Multidetector CT imaging of the head and cervical spine was performed following the standard  protocol without intravenous contrast.  Multiplanar CT image reconstructions of the cervical spine were also generated.  Comparison:  None.  CT HEAD  Findings: Severe cortical and deep atrophy and moderate cerebellar atrophy.  Moderate to severe changes of small vessel disease of the white matter diffusely.  Physiologic calcifications in the basal ganglia.  No mass lesion.  No midline shift.  No acute hemorrhage or hematoma.  No extra-axial fluid collections.  No evidence of acute infarction.  No skull fracture or other focal osseous abnormality involving the skull.  Visualized paranasal sinuses, bilateral mastoid air cells, and bilateral middle ear cavities well-aerated.  Extensive bilateral carotid siphon and left vertebral artery atherosclerosis.  IMPRESSION:  1.  No acute intracranial abnormality. 2.  Moderate to severe generalized atrophy and moderate to severe chronic microvascular ischemic changes of the white matter.  CT CERVICAL SPINE  Findings: No fractures identified involving the cervical spine. Exaggeration of the usual cervical lordosis on the sagittal reconstructed images is likely due to an exaggerated thoracic kyphosis.  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  Disc space narrowing and associated endplate hypertrophic changes at C3-4, C5-6, C6-7, and to a lesser degree C7-T1.  No spinal stenosis.  Facet joints intact with diffuse degenerative changes.  Coronal reformatted images demonstrate an intact craniocervical junction, intact C1-C2 articulation, intact dens, and intact lateral masses.  Combination of uncinate and facet hypertrophy account for multilevel foraminal stenoses including severe right and mild left C3-4, moderate right C4-5, mild right C5-6, mild left C6-7.  Note is made of emphysematous changes in the visualized lung apices.  IMPRESSION:  1.  No cervical spine fracture identified.  2.  Multilevel degenerative disc disease, spondylosis, and  facet degenerative changes with multilevel foraminal stenoses as detailed above. 3.  Emphysematous changes in the visualized lung apices.   Original Report Authenticated By: Hulan Saas,  M.D.    Dg Esophagus  11/02/2012  *RADIOLOGY REPORT*  Clinical Data: Difficulty swallowing.  ESOPHOGRAM/BARIUM SWALLOW  Technique:  Single contrast examination was performed using thin barium.  Fluoroscopy time:  2.4 minutes.  Comparison:  None.  Findings:  Single contrast imaging was performed secondary to patient immobility.  The esophagus was evaluated in both oblique projections with the patient had up to about 220 - 30 degrees. With swallowing, disruption of primary peristaltic stripping waves is evident on all swallows.  The esophagus is somewhat patulous and there is mass effect on the distal esophagus secondary to the patient's thoracic aortic aneurysm.  No definite stricture can be identified in the esophagus.  There is no esophageal diverticulum. No definite esophageal mass.  13 mm barium tablet comes to rest in the distal third of the esophagus, just proximal to the location where the esophagus crosses over the dilated descending thoracic aorta.  The tablet becomes static in this position secondary to the lack of peristaltic waves and mass effect from the thoracic aorta.  No convincing evidence for stricture at this level.  IMPRESSION: Nonspecific esophageal motility disorder with somewhat patulous appearance of the esophagus.  Study is limited by patient immobility, but no convincing stricture or mass lesion is evident.   Original Report Authenticated By: Kennith Center, M.D.    Dg Pelvis Portable  10/29/2012  *RADIOLOGY REPORT*  Clinical Data: Found unresponsive earlier this morning outside. Hypothermia.  PORTABLE PELVIS AP VIEW 10/29/2012 1032 hours:  Comparison: None.  Findings: No acute fractures involving the pelvis or either proximal femur.  Symmetric mild axial joint space narrowing in both hips.   Sacroiliac joints and symphysis pubis intact.  Degenerative changes involving the visualized lower lumbar spine.  IMPRESSION: No acute or significant osseous abnormality for age.   Original Report Authenticated By: Hulan Saas, M.D.    Dg Chest Port 1 View  10/30/2012  *RADIOLOGY REPORT*  Clinical Data: Ventilator dependent respiratory failure. Hypothermia.  PORTABLE CHEST - 1 VIEW 10/30/2012 0519 hours:  Comparison: Portable chest x-rays yesterday.  Findings: Endotracheal tube tip remains in satisfactory position projecting approximately 5 cm above the carina.  Nasogastric tube courses below the diaphragm into the stomach.  Left subclavian central venous catheter tip remains injecting over the SVC. Thoracic aortic stent graft.  Cardiac silhouette enlarged but stable.  Interval improvement in the interstitial and airspace pulmonary edema since yesterday, with minimal interstitial edema persisting.  Mild atelectasis in the lung bases related to a less than optimal inspiration.  No new pulmonary parenchymal abnormalities.  IMPRESSION: Support apparatus satisfactory.  Improved pulmonary edema, with only minimal interstitial edema persisting.  Suboptimal inspiration accounts for bibasilar atelectasis.  No new abnormalities.   Original Report Authenticated By: Hulan Saas, M.D.    Dg Chest Portable 1 View  10/29/2012  *RADIOLOGY REPORT*  Clinical Data: The patient was found unresponsive outside his home earlier today and is suffering from hypothermia.  Bedside central venous catheter placement.  PORTABLE CHEST - 1 VIEW 10/29/2012 1242 hours:  Comparison: Portable chest x-ray earlier same date 1028 hours.  Findings: Left subclavian central venous catheter tip projects over the lower SVC.  No evidence of pneumothorax mediastinal hematoma. Cardiac silhouette markedly enlarged but stable.  Mild diffuse interstitial and airspace pulmonary edema which has progressed since earlier in the day.  Prior thoracic  aortic stent graft. Endotracheal tube tip remains in satisfactory position approximately 5 cm above the carina.  IMPRESSION:  1.  Left subclavian central venous catheter  tip projects over the lower SVC.  No acute complicating features. 2.  Remaining support apparatus satisfactory. 3.  Developing interstitial and airspace opacities throughout both lungs, likely pulmonary edema.  Query fluid overload.   Original Report Authenticated By: Hulan Saas, M.D.    Dg Chest Portable 1 View  10/29/2012  *RADIOLOGY REPORT*  Clinical Data: Acute respiratory failure.  Hypothermia. Unresponsive.  PORTABLE CHEST - 1 VIEW  Comparison: None.  Findings: Endotracheal tube is seen with tip approximately 4 cm above carina.  Moderate cardiomegaly is seen.  Both lungs are clear.  No evidence of pneumothorax or pleural effusion.  Ectasia of the thoracic aorta is noted.  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  IMPRESSION: Moderate cardiomegaly.  No active lung disease.   Original Report Authenticated By: Myles Rosenthal, M.D.    Dg Hand Complete Left  10/31/2012  *RADIOLOGY REPORT*  Clinical Data: Pain and swelling in the left hand.  Evaluate for fracture.  LEFT HAND - COMPLETE 3+ VIEW  Comparison: No priors.  Findings: Three views of the left hand demonstrate marked soft tissue swelling overlying the metacarpals.  There is mild irregularity of the fifth metacarpal, suggesting an old healed fracture.  No acute displaced fracture, subluxation or dislocation is noted.  Degenerative changes of osteoarthritis are noted, most severe at the first [facility] joint where there is severe joint space narrowing, subchondral sclerosis, subchondral cyst formation and osteophyte formation.  IMPRESSION: 1.  Soft tissue swelling in the hand, particularly in the region of the metacarpals, without acute bony abnormality. 2.  Old healed fifth metacarpal fracture. 3.  Degenerative changes of osteoarthritis, as above.   Original  Report Authenticated By: Trudie Reed, M.D.    Dg Swallowing Func-speech Pathology  11/02/2012  Breck Coons [place], CCC-SLP     11/02/2012 12:52 PM Objective Swallowing Evaluation: Modified Barium Swallowing Study   Patient Details  Name: Miguel Swanson MRN: [id number] Date of Birth: October 01, 1926  Today's Date: 11/02/2012 Time: 1040-1100 SLP Time Calculation (min): 20 min  Past Medical History:  Past Medical History  Diagnosis Date  . CAD (coronary artery disease)   . Hyperlipidemia   . Papillary carcinoma 2007  . Dysphagia    Past Surgical History:  Past Surgical History  Procedure Date  . Transurethral resection of bladder 2007    resection of papilary tumor and placement of ureteral stent  . Coronary artery bypass graft 2004  . Abdominal aortic aneurysm repair 2005  . Inguinal hernia repair     bil  . Abdominal aortic endovascular stent graft 3/13, 9/13   HPI:  62 yr old admitted after LOC, found down by neighbor (9pm-9am)  resulting in hypotension and hypothermia, significant frostbite  left hand.  Pt. intubated on arrvial 1/26 and extubated 1/27.   PMH:  AAA, CAD, CABG, hyperlipidemia.  Observed to cough with  water and swallow assessment ordered.  CXR multifocal pathcy  opacities favored to reflect aspiration or edema.  Pt./family  report recent 40 lb unintentional weight loss, globus sensation  in chest/pharynx, coughing with food/liquid.  MBS followed by  esophagram recommended after bedside swallow.      Assessment / Plan / Recommendation Clinical Impression  Dysphagia Diagnosis: Moderate oral phase dysphagia;Moderate  pharyngeal phase dysphagia (suspected significant esophageal  dysphagia) Clinical impression: Pt. demonstrated moderate motor based oral  dysphagia with delyas and additional effort required to orally  transit bolus to posterior oral cavtiy and delayed prep,  manipulation and mastication with  cracker Mild).  Pharyngeal  phase is described as both sensory and motor impairments leading  to  delayed swallow initiation and laryngeal penetration with thin  to vocal cords silently.  A chin tuck strategy was implemented  and appeared to prevent penetration with thin although a mild  aspiration risk continues.  Of significance is pt.'s vallecular  and pyriform sinsus residue (mild-mod) with decreased sensation  that was suspected to penetrate laryngeal vestibule on one  occassion.  Esophagus what appeared to be significant residue in  distal portion which did not empty during this assessment.  Pt.  is having an esophagram following this study.  SLP recommends Dys  3 and thin liquids with chin tuck with all liquids, pills  crushed, full supervision.  SLP will follow for safety and  ability to perform swallow precautions.           Treatment Recommendation  Therapy as outlined in treatment plan below    Diet Recommendation Dysphagia 3 (Mechanical Soft);Thin liquid   Liquid Administration via: Cup;No straw Medication Administration: Crushed with puree Supervision: Full supervision/cueing for compensatory  strategies;Patient able to self feed Compensations: Slow rate;Small sips/bites;Follow solids with  liquid Postural Changes and/or Swallow Maneuvers: Seated upright 90  degrees;Upright 30-60 min after meal;Chin tuck (chin tuck with  liquids)    Other  Recommendations Oral Care Recommendations: Oral care BID   Follow Up Recommendations   (to be determined)    Frequency and Duration min 2x/week  2 weeks   Pertinent Vitals/Pain No indications    SLP Swallow Goals Patient will utilize recommended strategies during swallow to  increase swallowing safety with: Moderate cueing Goal #3: Pt. will consume safest diet texture/po's adhering to  swallow/esophageal precautions with min verbal cues.  Swallow Study Goal #3 - Progress: Discontinued (comment)      Reason for Referral Objectively evaluate swallowing function   Oral Phase Oral Preparation/Oral Phase Oral Phase: Impaired Oral - Thin Oral - Thin Teaspoon: Piecemeal  swallowing;Delayed oral  transit;Reduced posterior propulsion;Weak lingual manipulation Oral - Thin Cup: Piecemeal swallowing;Delayed oral  transit;Reduced posterior propulsion;Weak lingual manipulation Oral - Solids Oral - Regular: Piecemeal swallowing;Delayed oral transit;Reduced  posterior propulsion;Weak lingual manipulation   Pharyngeal Phase Pharyngeal Phase Pharyngeal Phase: Impaired Pharyngeal - Thin Pharyngeal - Thin Teaspoon: Delayed swallow initiation;Premature  spillage to pyriform sinuses;Penetration/Aspiration during  swallow;Reduced laryngeal elevation;Pharyngeal residue -  valleculae;Pharyngeal residue - pyriform sinuses;Reduced tongue  base retraction;Reduced airway/laryngeal closure Penetration/Aspiration details (thin teaspoon): Material enters  airway, CONTACTS cords and not ejected out (silent) Pharyngeal - Thin Cup: Delayed swallow initiation;Premature  spillage to pyriform sinuses;Pharyngeal residue -  valleculae;Pharyngeal residue - pyriform sinuses;Reduced tongue  base retraction;Reduced laryngeal elevation;Premature spillage to  valleculae (chin tuck implemented) Pharyngeal - Thin Straw: Penetration/Aspiration during  swallow;Reduced laryngeal elevation;Reduced tongue base  retraction;Reduced airway/laryngeal closure;Pharyngeal residue -  valleculae;Pharyngeal residue - pyriform sinuses Penetration/Aspiration details (thin straw): Material enters  airway, remains ABOVE vocal cords and not ejected out (silent) Pharyngeal - Solids Pharyngeal - Puree: Within functional limits Pharyngeal - Regular: Pharyngeal residue - valleculae;Pharyngeal  residue - pyriform sinuses (? penetration of residue post  swallow)  Cervical Esophageal Phase       Cervical Esophageal Phase Cervical Esophageal Phase: [facility]         Darrow Bussing.Ed CCC-SLP Pager [phone]  11/02/2012     Microbiology: Recent Results (from the past 240 hour(s))  CULTURE, BLOOD (ROUTINE X 2)     Status: Normal   Collection Time  10/29/12  1:00 PM      Component Value Range Status Comment   Specimen Description BLOOD CENTRAL LINE   Final    Special Requests BOTTLES DRAWN AEROBIC ONLY 10CC   Final    Culture  Setup Time 10/29/2012 23:09   Final    Culture NO GROWTH 5 DAYS   Final    Report Status 11/04/2012 FINAL   Final   CULTURE, BLOOD (ROUTINE X 2)     Status: Normal (Preliminary result)   Collection Time   10/29/12  1:10 PM      Component Value Range Status Comment   Specimen Description BLOOD RIGHT HAND   Final    Special Requests BOTTLES DRAWN AEROBIC AND ANAEROBIC 10CC   Final    Culture  Setup Time 10/29/2012 23:09   Final    Culture     Final    Value: GRAM NEGATIVE RODS        BLOOD CULTURE RECEIVED NO GROWTH TO DATE CULTURE WILL BE HELD FOR 5 DAYS BEFORE ISSUING A FINAL NEGATIVE REPORT     Note: Gram Stain Report Called to,Read Back By and Verified With: SUSIE NIX ON 11/03/2012 AT 9:20P BY WILEJ   Report Status PENDING   Incomplete   CULTURE, RESPIRATORY     Status: Normal   Collection Time   10/29/12  3:40 PM      Component Value Range Status Comment   Specimen Description TRACHEAL ASPIRATE   Final    Special Requests NONE   Final    Gram Stain     Final    Value: NO WBC SEEN     NO SQUAMOUS EPITHELIAL CELLS SEEN     RARE GRAM NEGATIVE RODS   Culture MODERATE KLEBSIELLA PNEUMONIAE   Final    Report Status 11/01/2012 FINAL   Final    Organism ID, Bacteria KLEBSIELLA PNEUMONIAE   Final   MRSA PCR SCREENING     Status: Normal   Collection Time   10/29/12  5:41 PM      Component Value Range Status Comment   MRSA by PCR NEGATIVE  NEGATIVE Final   URINE CULTURE     Status: Normal   Collection Time   10/29/12  7:44 PM      Component Value Range Status Comment   Specimen Description URINE, CATHETERIZED   Final    Special Requests NONE   Final    Culture  Setup Time 10/30/2012 01:50   Final    Colony Count NO GROWTH   Final    Culture NO GROWTH   Final    Report Status 10/31/2012 FINAL   Final    CLOSTRIDIUM DIFFICILE BY PCR     Status: Normal   Collection Time   11/02/12  1:49 PM      Component Value Range Status Comment   C difficile by pcr NEGATIVE  NEGATIVE Final      Labs: Basic Metabolic Panel:  Lab 11/03/12 4098 11/01/12 1030 10/31/12 1320 10/31/12 0430 10/30/12 0418 10/29/12 1320  NA 142 142 135 138 137 --  K 4.9 3.9 3.6 2.8* 3.6 --  CL 108 106 101 103 99 --  CO2 18* 25 24 24 23  --  GLUCOSE 101* 113* 105* 96 139* --  BUN 31* 29* 37* 46* 61* --  CREATININE 0.96 1.04 1.08 1.25 2.03* --  CALCIUM 8.2* 8.4 7.9* 7.7* 8.3* --  MG -- -- -- 2.3 2.4 2.8*  PHOS -- -- -- 1.9*  6.5* 8.9*   Liver Function Tests:  Lab 10/29/12 1320  AST 65*  ALT 35  ALKPHOS 127*  BILITOT 1.1  PROT 7.0  ALBUMIN 3.0*   CBC:  Lab 11/03/12 0430 11/01/12 1030 10/31/12 0430 10/30/12 0418 10/29/12 1320  WBC 9.7 9.8 12.3* 15.7* 22.3*  NEUTROABS -- -- -- -- 19.8*  HGB 10.8* 11.6* 10.2* 12.4* 12.4*  HCT 32.2* 34.7* 29.6* 35.6* 35.5*  MCV 89.7 90.1 87.1 88.3 89.4  PLT 144* 148* 123* 154 186   Cardiac Enzymes:  Lab 10/30/12 2210 10/30/12 1840 10/30/12 1223 10/30/12 1105  CKTOTAL -- -- 1561* --  CKMB -- -- 8.8* --  CKMBINDEX -- -- -- --  TROPONINI 0.43* 0.49* 0.46* 0.51*   BNP: BNP (last 3 results)  Basename 10/29/12 1320  PROBNP 21030.0*   CBG:  Lab 10/29/12 1403 10/29/12 1240  GLUCAP 85 97     Signed:  Querida Beretta  [facility] 11/05/2012, 11:54 AM

## 2012-11-05 NOTE — Progress Notes (Signed)
Pt HR continues to drop to high 30's, non-sustained with frequent PVC's at rest. Provider on-call for [facility]. VSS, will continue to monitor pt closely.

## 2012-11-07 ENCOUNTER — Encounter: Payer: Self-pay | Admitting: Internal Medicine

## 2012-11-18 ENCOUNTER — Other Ambulatory Visit: Payer: Self-pay | Admitting: Internal Medicine

## 2012-12-13 ENCOUNTER — Telehealth: Payer: Self-pay | Admitting: *Deleted

## 2012-12-13 ENCOUNTER — Telehealth: Payer: Self-pay | Admitting: Internal Medicine

## 2012-12-13 NOTE — Telephone Encounter (Signed)
ok 

## 2012-12-13 NOTE — Telephone Encounter (Signed)
Left msg on triage stating would like a verbal to continue seeing pt for 3 x's a week for next 2 weeks, then 2 x's a week for 2 weeks....Raechel Chute

## 2012-12-13 NOTE — Telephone Encounter (Signed)
yes

## 2012-12-13 NOTE — Telephone Encounter (Signed)
Caller: /Other; Phone: [phone]; Reason for Call: Joleen/Home Health OT calling about Occupational therapist seeing patient in follow up regarding frostbite left hand.  Hand is doing well and is able to make a partial fist.  Per Epic, office staff/Lucy Brand states, "Left msg on triage stating would like a verbal to continue seeing pt for 3 x's a week for next 2 weeks, then 2 x's a week for 2 weeks.  Marland Kitchen  Marland Kitchen  Raechel Chute" Info to office for provider review/order/callback.  May reach OT at [phone].  Krs/can

## 2012-12-14 NOTE — Telephone Encounter (Signed)
Se previous phone msg LMOM with md response...Miguel Swanson

## 2012-12-14 NOTE — Telephone Encounter (Signed)
Called Jolene no answer LMOM md response...Raechel Chute

## 2012-12-19 ENCOUNTER — Ambulatory Visit (INDEPENDENT_AMBULATORY_CARE_PROVIDER_SITE_OTHER): Payer: 59 | Admitting: Internal Medicine

## 2012-12-19 ENCOUNTER — Encounter: Payer: Self-pay | Admitting: Internal Medicine

## 2012-12-19 VITALS — BP 124/66 | HR 61 | Temp 97.3°F | Resp 14 | Wt 175.5 lb

## 2012-12-19 DIAGNOSIS — E785 Hyperlipidemia, unspecified: Secondary | ICD-10-CM

## 2012-12-19 DIAGNOSIS — M1990 Unspecified osteoarthritis, unspecified site: Secondary | ICD-10-CM

## 2012-12-19 DIAGNOSIS — I10 Essential (primary) hypertension: Secondary | ICD-10-CM

## 2012-12-19 MED ORDER — OXYCODONE HCL 5 MG PO TABA: 5.0000 mg | ORAL_TABLET | ORAL | Status: DC | PRN

## 2012-12-19 NOTE — Progress Notes (Signed)
Subjective:    Patient ID: Miguel Swanson, male    DOB: [birth year], 79 y.o.   MRN: [id number]  Arthritis Presents for follow-up visit. He complains of pain, stiffness and joint swelling. He reports no joint warmth. The symptoms have been improving. Affected location: left hand s/p frostbite. His pain is at a severity of 2/10. Associated symptoms include fatigue, pain at night and pain while resting. Pertinent negatives include no diarrhea, dry eyes, dry mouth, dysuria, fever, rash, Raynaud's syndrome, uveitis or weight loss. Compliance with total regimen is 76-100%.      Review of Systems  Constitutional: Positive for fatigue. Negative for fever, chills, weight loss, diaphoresis, appetite change and unexpected weight change.  HENT: Negative.   Eyes: Negative.   Respiratory: Negative for apnea, cough, choking, chest tightness, shortness of breath and wheezing.   Gastrointestinal: Negative for nausea, vomiting, abdominal pain, diarrhea, constipation and blood in stool.  Endocrine: Negative.   Genitourinary: Negative.  Negative for dysuria.  Musculoskeletal: Positive for joint swelling, arthralgias, arthritis and stiffness. Negative for myalgias, back pain and gait problem.  Skin: Negative for color change, pallor, rash and wound.  Allergic/Immunologic: Negative.   Neurological: Negative.  Negative for dizziness, weakness and light-headedness.  Hematological: Negative for adenopathy. Does not bruise/bleed easily.  Psychiatric/Behavioral: Negative.        Objective:   Physical Exam  Vitals reviewed. Constitutional: He is oriented to person, place, and time. He appears well-developed and well-nourished. No distress.  HENT:  Head: Normocephalic and atraumatic.  Mouth/Throat: Oropharynx is clear and moist. No oropharyngeal exudate.  Eyes: Conjunctivae are normal. Right eye exhibits no discharge. Left eye exhibits no discharge. No scleral icterus.  Neck: Normal range of motion. Neck  supple. No JVD present. No tracheal deviation present. No thyromegaly present.  Cardiovascular: Normal rate, regular rhythm, S1 normal, S2 normal and intact distal pulses.  Exam reveals no gallop.   Murmur heard.  Decrescendo systolic murmur is present with a grade of 1/6   No diastolic murmur is present  Pulses:      Carotid pulses are 1+ on the right side, and 1+ on the left side.      Radial pulses are 1+ on the right side, and 1+ on the left side.       Femoral pulses are 1+ on the right side, and 1+ on the left side.      Popliteal pulses are 1+ on the right side, and 1+ on the left side.       Dorsalis pedis pulses are 1+ on the right side, and 1+ on the left side.       Posterior tibial pulses are 1+ on the right side, and 1+ on the left side.  Pulmonary/Chest: Effort normal and breath sounds normal. No stridor. No respiratory distress. He has no wheezes. He has no rales. He exhibits no tenderness.  Abdominal: Soft. Bowel sounds are normal. He exhibits no distension and no mass. There is no tenderness. There is no rebound and no guarding.  Musculoskeletal: Normal range of motion. He exhibits no edema and no tenderness.       Left hand: He exhibits swelling. He exhibits normal range of motion, no tenderness, no bony tenderness, normal two-point discrimination, normal capillary refill, no deformity and no laceration. Normal sensation noted. Normal strength noted.  Left hand is mildly swollen and diffusely hyperemic, there are no necrotic lesions/ulcers/vesicles/paleness, the nails are intact  Lymphadenopathy:    He has no cervical adenopathy.  Neurological: He is oriented to person, place, and time.  Skin: Skin is warm and dry. No rash noted. He is not diaphoretic. No erythema. No pallor.  Psychiatric: He has a normal mood and affect. His behavior is normal. Judgment and thought content normal.     Lab Results  Component Value Date   WBC 9.7 11/03/2012   HGB 10.8* 11/03/2012   HCT  32.2* 11/03/2012   PLT 144* 11/03/2012   GLUCOSE 101* 11/03/2012   CHOL 135 07/21/2011   TRIG 99.0 07/21/2011   HDL 37.90* 07/21/2011   LDLCALC 77 07/21/2011   ALT 35 10/29/2012   AST 65* 10/29/2012   NA 142 11/03/2012   K 4.9 11/03/2012   CL 108 11/03/2012   CREATININE 0.96 11/03/2012   BUN 31* 11/03/2012   CO2 18* 11/03/2012   TSH 3.28 07/21/2011   INR 1.47 10/29/2012   HGBA1C 6.0 07/21/2011       Assessment & Plan:

## 2012-12-19 NOTE — Patient Instructions (Signed)

## 2012-12-19 NOTE — Assessment & Plan Note (Signed)
His BP is well controlled 

## 2012-12-19 NOTE — Assessment & Plan Note (Signed)
He is doing well on lipitor 

## 2012-12-19 NOTE — Assessment & Plan Note (Signed)
He is doing well on oxycodone, will continue that as needed

## 2012-12-23 DIAGNOSIS — I10 Essential (primary) hypertension: Secondary | ICD-10-CM

## 2012-12-23 DIAGNOSIS — IMO0001 Reserved for inherently not codable concepts without codable children: Secondary | ICD-10-CM

## 2012-12-23 DIAGNOSIS — M6281 Muscle weakness (generalized): Secondary | ICD-10-CM

## 2012-12-23 DIAGNOSIS — I2510 Atherosclerotic heart disease of native coronary artery without angina pectoris: Secondary | ICD-10-CM

## 2012-12-23 DIAGNOSIS — I509 Heart failure, unspecified: Secondary | ICD-10-CM

## 2012-12-29 ENCOUNTER — Other Ambulatory Visit: Payer: Self-pay | Admitting: Nurse Practitioner

## 2012-12-31 ENCOUNTER — Other Ambulatory Visit: Payer: Self-pay | Admitting: Nurse Practitioner

## 2012-12-31 ENCOUNTER — Other Ambulatory Visit: Payer: Self-pay | Admitting: Internal Medicine

## 2013-01-03 ENCOUNTER — Telehealth: Payer: Self-pay | Admitting: *Deleted

## 2013-01-03 NOTE — Telephone Encounter (Signed)
Left msg on triage stating requesting additional orders to continue seeing pt 2 x's a week for 3 more weeks. Pt is still having problem with gait abnormality, balance & weakness...Raechel Chute

## 2013-01-04 NOTE — Telephone Encounter (Signed)
yes

## 2013-01-04 NOTE — Telephone Encounter (Signed)
Erskine Squibb PT with [facility] informed of verbal order.

## 2013-01-13 ENCOUNTER — Telehealth: Payer: Self-pay | Admitting: Internal Medicine

## 2013-01-13 NOTE — Telephone Encounter (Signed)
Jan called to say the patient had an appt. Somewhere today and didn't want her to come for the physical therapy appointment.

## 2013-01-17 ENCOUNTER — Other Ambulatory Visit: Payer: Self-pay | Admitting: Internal Medicine

## 2013-01-17 ENCOUNTER — Other Ambulatory Visit: Payer: Self-pay | Admitting: Nurse Practitioner

## 2013-01-24 ENCOUNTER — Telehealth: Payer: Self-pay | Admitting: Internal Medicine

## 2013-01-24 NOTE — Telephone Encounter (Signed)
Caller: Mike/Child; Phone: [phone]; Reason for Call: Son is calling about his Dad's medications.  He states that when the patient was in Rehab they were taking care of all of his medications and when he was discharged Dr.  Yetta Barre started taking care of all of them, except for his Xanax, Ranitidine & Atorvastatin, which were being taken care of by the [facility].  Patient's son states that the patient is out his Xanax and he is no longer being seen at the [place].  He has enough of the other 2 medications to get through until his Follow-Up Appt with Dr.  Yetta Barre on 02/22/13 @ 2: 30 pm.  When they are going to talk to him about taking over all 3 of these medications also.  Right now he is out of the Xanax and he wants to know if Dr.  Yetta Barre could call in a prescription to get him through until 02/22/13.  Uses [facility] on [address] @ [phone].  Please call him @ [phone] (cell phone)

## 2013-01-24 NOTE — Telephone Encounter (Signed)
Yes, you can refill the xanax

## 2013-01-25 ENCOUNTER — Other Ambulatory Visit: Payer: Self-pay | Admitting: Internal Medicine

## 2013-01-25 ENCOUNTER — Telehealth: Payer: Self-pay | Admitting: Internal Medicine

## 2013-01-25 MED ORDER — ALPRAZOLAM 0.5 MG PO TABS: 0.2500 mg | ORAL_TABLET | Freq: Three times a day (TID) | ORAL | Status: DC

## 2013-01-25 NOTE — Telephone Encounter (Signed)
Rx for Xanax called into [facility]. Pt's son informed.

## 2013-01-25 NOTE — Telephone Encounter (Signed)
Jan, PT from [facility], called stated that she just wants to inform Dr. Yetta Barre that she is discharging Mr. Stoudt.

## 2013-01-27 ENCOUNTER — Other Ambulatory Visit: Payer: Self-pay | Admitting: Internal Medicine

## 2013-01-29 ENCOUNTER — Other Ambulatory Visit: Payer: Self-pay | Admitting: Internal Medicine

## 2013-02-02 ENCOUNTER — Other Ambulatory Visit: Payer: Self-pay | Admitting: Internal Medicine

## 2013-02-21 ENCOUNTER — Other Ambulatory Visit: Payer: Self-pay | Admitting: Internal Medicine

## 2013-02-22 ENCOUNTER — Encounter: Payer: Self-pay | Admitting: Internal Medicine

## 2013-02-22 ENCOUNTER — Ambulatory Visit (INDEPENDENT_AMBULATORY_CARE_PROVIDER_SITE_OTHER): Payer: 59 | Admitting: Internal Medicine

## 2013-02-22 ENCOUNTER — Other Ambulatory Visit (INDEPENDENT_AMBULATORY_CARE_PROVIDER_SITE_OTHER): Payer: 59

## 2013-02-22 VITALS — BP 120/82 | HR 67 | Temp 97.7°F | Resp 16 | Wt 176.8 lb

## 2013-02-22 DIAGNOSIS — E785 Hyperlipidemia, unspecified: Secondary | ICD-10-CM

## 2013-02-22 DIAGNOSIS — I10 Essential (primary) hypertension: Secondary | ICD-10-CM

## 2013-02-22 DIAGNOSIS — R739 Hyperglycemia, unspecified: Secondary | ICD-10-CM

## 2013-02-22 DIAGNOSIS — R7309 Other abnormal glucose: Secondary | ICD-10-CM

## 2013-02-22 DIAGNOSIS — Z23 Encounter for immunization: Secondary | ICD-10-CM

## 2013-02-22 LAB — LIPID PANEL
Cholesterol: 123 mg/dL (ref 0–200)
LDL Cholesterol: 60 mg/dL (ref 0–99)
Triglycerides: 120 mg/dL (ref 0.0–149.0)
VLDL: 24 mg/dL (ref 0.0–40.0)

## 2013-02-22 LAB — COMPREHENSIVE METABOLIC PANEL
Alkaline Phosphatase: 91 U/L (ref 39–117)
BUN: 20 mg/dL (ref 6–23)
Glucose, Bld: 86 mg/dL (ref 70–99)
Sodium: 139 mEq/L (ref 135–145)
Total Bilirubin: 0.9 mg/dL (ref 0.3–1.2)

## 2013-02-22 LAB — TSH: TSH: 4.39 u[IU]/mL (ref 0.35–5.50)

## 2013-02-22 MED ORDER — OLMESARTAN MEDOXOMIL 20 MG PO TABS: 20.0000 mg | ORAL_TABLET | Freq: Every day | ORAL | Status: DC

## 2013-02-22 NOTE — Patient Instructions (Signed)

## 2013-02-23 ENCOUNTER — Encounter: Payer: Self-pay | Admitting: Internal Medicine

## 2013-02-23 NOTE — Assessment & Plan Note (Signed)
He is doing well on the statin His LDL is at the goal level

## 2013-02-23 NOTE — Progress Notes (Signed)
  Subjective:    Patient ID: Miguel Swanson, male    DOB: 09/02/1926, 79 y.o.   MRN: [id number]  Hypertension This is a chronic problem. The current episode started more than 1 year ago. The problem is controlled. Pertinent negatives include no anxiety, blurred vision, chest pain, headaches, malaise/fatigue, neck pain, orthopnea, palpitations, peripheral edema, PND, shortness of breath or sweats. Past treatments include ACE inhibitors, beta blockers and diuretics. Compliance problems include medication side effects (nocturnal throat clearing).  Hypertensive end-organ damage includes CAD/MI, heart failure and PVD.      Review of Systems  Constitutional: Negative.  Negative for fever, chills, malaise/fatigue, diaphoresis, activity change, appetite change, fatigue and unexpected weight change.  HENT: Negative.  Negative for congestion, sore throat, rhinorrhea, drooling, trouble swallowing, neck pain, voice change, postnasal drip and sinus pressure.   Eyes: Negative.  Negative for blurred vision.  Respiratory: Negative.  Negative for cough, chest tightness, shortness of breath, wheezing and stridor.   Cardiovascular: Negative.  Negative for chest pain, palpitations, orthopnea, leg swelling and PND.  Gastrointestinal: Negative.  Negative for nausea, vomiting, abdominal pain, diarrhea, constipation and blood in stool.  Endocrine: Negative.   Genitourinary: Negative.   Musculoskeletal: Negative.  Negative for myalgias, back pain, joint swelling, arthralgias and gait problem.  Skin: Negative.   Allergic/Immunologic: Negative.   Neurological: Negative.  Negative for dizziness, weakness and headaches.  Hematological: Negative.  Negative for adenopathy. Does not bruise/bleed easily.  Psychiatric/Behavioral: Negative.        Objective:   Physical Exam  Vitals reviewed. Constitutional: He is oriented to person, place, and time. He appears well-developed and well-nourished. No distress.  HENT:   Head: Normocephalic and atraumatic.  Mouth/Throat: Oropharynx is clear and moist. No oropharyngeal exudate.  Eyes: Conjunctivae are normal. Right eye exhibits no discharge. Left eye exhibits no discharge. No scleral icterus.  Neck: Normal range of motion. Neck supple. No JVD present. No tracheal deviation present. No thyromegaly present.  Cardiovascular: Normal rate, regular rhythm and intact distal pulses.  Exam reveals no gallop and no friction rub.   Murmur heard. Pulmonary/Chest: Effort normal and breath sounds normal. No stridor. No respiratory distress. He has no wheezes. He has no rales. He exhibits no tenderness.  Abdominal: Soft. Bowel sounds are normal. He exhibits no distension and no mass. There is no tenderness. There is no rebound and no guarding.  Musculoskeletal: Normal range of motion. He exhibits no edema and no tenderness.  Lymphadenopathy:    He has no cervical adenopathy.  Neurological: He is oriented to person, place, and time.  Skin: Skin is warm and dry. No rash noted. He is not diaphoretic. No erythema. No pallor.  Psychiatric: He has a normal mood and affect. His behavior is normal. Judgment and thought content normal.     Lab Results  Component Value Date   WBC 9.7 11/03/2012   HGB 10.8* 11/03/2012   HCT 32.2* 11/03/2012   PLT 144* 11/03/2012   GLUCOSE 86 02/22/2013   CHOL 123 02/22/2013   TRIG 120.0 02/22/2013   HDL 39.20 02/22/2013   LDLCALC 60 02/22/2013   ALT 31 02/22/2013   AST 19 02/22/2013   NA 139 02/22/2013   K 4.9 02/22/2013   CL 104 02/22/2013   CREATININE 1.2 02/22/2013   BUN 20 02/22/2013   CO2 28 02/22/2013   TSH 4.39 02/22/2013   INR 1.47 10/29/2012   HGBA1C 5.9 02/22/2013       Assessment & Plan:

## 2013-02-23 NOTE — Assessment & Plan Note (Signed)
He has s/s c/w ACEI intolerance so I have asked him to stop it I have asked him to start Benicar to treat his HTN

## 2013-02-23 NOTE — Assessment & Plan Note (Signed)
I will check his A1C to see if he has developed DM2 

## 2013-03-01 ENCOUNTER — Other Ambulatory Visit: Payer: Self-pay | Admitting: Internal Medicine

## 2013-03-03 ENCOUNTER — Other Ambulatory Visit: Payer: Self-pay | Admitting: Internal Medicine

## 2013-03-03 ENCOUNTER — Other Ambulatory Visit: Payer: Self-pay

## 2013-03-03 MED ORDER — POLYETHYLENE GLYCOL 3350 17 GM/SCOOP PO POWD: ORAL | Status: DC

## 2013-03-14 ENCOUNTER — Other Ambulatory Visit: Payer: Self-pay | Admitting: Internal Medicine

## 2013-03-24 ENCOUNTER — Other Ambulatory Visit: Payer: Self-pay | Admitting: Nurse Practitioner

## 2013-04-21 ENCOUNTER — Other Ambulatory Visit: Payer: Self-pay | Admitting: Nurse Practitioner

## 2013-05-10 ENCOUNTER — Other Ambulatory Visit: Payer: Self-pay | Admitting: Internal Medicine

## 2013-07-20 ENCOUNTER — Other Ambulatory Visit: Payer: Self-pay | Admitting: Nurse Practitioner

## 2013-08-09 ENCOUNTER — Other Ambulatory Visit: Payer: Self-pay | Admitting: Nurse Practitioner

## 2013-08-10 ENCOUNTER — Encounter: Payer: Self-pay | Admitting: Internal Medicine

## 2013-08-10 ENCOUNTER — Ambulatory Visit (INDEPENDENT_AMBULATORY_CARE_PROVIDER_SITE_OTHER)
Admission: RE | Admit: 2013-08-10 | Discharge: 2013-08-10 | Disposition: A | Discharge status: Home / Self Care | Payer: 59 | Source: Ambulatory Visit | Attending: Internal Medicine | Admitting: Internal Medicine

## 2013-08-10 ENCOUNTER — Ambulatory Visit (INDEPENDENT_AMBULATORY_CARE_PROVIDER_SITE_OTHER): Payer: 59 | Admitting: Internal Medicine

## 2013-08-10 ENCOUNTER — Other Ambulatory Visit (INDEPENDENT_AMBULATORY_CARE_PROVIDER_SITE_OTHER): Payer: 59

## 2013-08-10 VITALS — BP 116/70 | HR 63 | Temp 97.9°F | Resp 16 | Ht 69.0 in | Wt 188.4 lb

## 2013-08-10 DIAGNOSIS — J984 Other disorders of lung: Secondary | ICD-10-CM

## 2013-08-10 DIAGNOSIS — I10 Essential (primary) hypertension: Secondary | ICD-10-CM

## 2013-08-10 DIAGNOSIS — Z0000 Encounter for general adult medical examination without abnormal findings: Secondary | ICD-10-CM

## 2013-08-10 DIAGNOSIS — F068 Other specified mental disorders due to known physiological condition: Secondary | ICD-10-CM

## 2013-08-10 DIAGNOSIS — D510 Vitamin B12 deficiency anemia due to intrinsic factor deficiency: Secondary | ICD-10-CM | POA: Insufficient documentation

## 2013-08-10 DIAGNOSIS — F0390 Unspecified dementia without behavioral disturbance: Secondary | ICD-10-CM

## 2013-08-10 DIAGNOSIS — Z23 Encounter for immunization: Secondary | ICD-10-CM

## 2013-08-10 LAB — CBC WITH DIFFERENTIAL/PLATELET
Basophils Relative: 1 % (ref 0.0–3.0)
Eosinophils Absolute: 0.3 10*3/uL (ref 0.0–0.7)
HCT: 40.4 % (ref 39.0–52.0)
Hemoglobin: 13.7 g/dL (ref 13.0–17.0)
MCHC: 34 g/dL (ref 30.0–36.0)
MCV: 95.9 fl (ref 78.0–100.0)
Monocytes Absolute: 0.6 10*3/uL (ref 0.1–1.0)
Neutro Abs: 4.5 10*3/uL (ref 1.4–7.7)
Platelets: 153 10*3/uL (ref 150.0–400.0)
RBC: 4.21 Mil/uL — ABNORMAL LOW (ref 4.22–5.81)

## 2013-08-10 LAB — FOLATE: Folate: >24.8 ng/mL (ref >5.9)

## 2013-08-10 LAB — IBC PANEL: Saturation Ratios: 37.7 % (ref 20.0–50.0)

## 2013-08-10 MED ORDER — LOSARTAN POTASSIUM 50 MG PO TABS: 50.0000 mg | ORAL_TABLET | Freq: Every day | ORAL | Status: DC

## 2013-08-10 NOTE — Assessment & Plan Note (Addendum)

## 2013-08-10 NOTE — Assessment & Plan Note (Signed)
His BP is well controlled Benicar changed to losartan due to cost concerns Today, I will check his lytes and renal function

## 2013-08-10 NOTE — Assessment & Plan Note (Signed)
I will recheck his CBC and will look at his vitamin levels as well 

## 2013-08-10 NOTE — Assessment & Plan Note (Signed)
Nodules appear stable on the Xray

## 2013-08-10 NOTE — Progress Notes (Signed)
Pre visit review using our clinic review tool, if applicable. No additional management support is needed unless otherwise documented below in the visit note. 

## 2013-08-10 NOTE — Progress Notes (Signed)
Subjective:    Patient ID: Miguel Swanson, male    DOB: September 05, 1926, 79 y.o.   MRN: [id number]  Hypertension This is a chronic problem. The current episode started more than 1 year ago. The problem is unchanged. The problem is controlled. Pertinent negatives include no anxiety, blurred vision, chest pain, headaches, malaise/fatigue, neck pain, orthopnea, palpitations, peripheral edema, PND, shortness of breath or sweats. Past treatments include diuretics, beta blockers and angiotensin blockers. The current treatment provides moderate improvement. Compliance problems include exercise and diet.  Hypertensive end-organ damage includes CAD/MI.      Review of Systems  Constitutional: Negative.  Negative for fever, chills, malaise/fatigue, diaphoresis, appetite change and fatigue.  HENT: Negative.   Eyes: Negative.  Negative for blurred vision.  Respiratory: Negative.  Negative for cough, choking, chest tightness, shortness of breath, wheezing and stridor.   Cardiovascular: Negative.  Negative for chest pain, palpitations, orthopnea, leg swelling and PND.  Gastrointestinal: Negative.  Negative for nausea, vomiting, abdominal pain, diarrhea, constipation and blood in stool.  Endocrine: Negative.   Genitourinary: Negative.   Musculoskeletal: Negative.  Negative for arthralgias, myalgias and neck pain.  Skin: Negative.   Allergic/Immunologic: Negative.   Neurological: Negative for dizziness, tremors, facial asymmetry, weakness, light-headedness and headaches.  Hematological: Negative.  Negative for adenopathy. Does not bruise/bleed easily.  Psychiatric/Behavioral: Negative.        Objective:   Physical Exam  Vitals reviewed. Constitutional: He is oriented to person, place, and time. He appears well-developed and well-nourished. No distress.  HENT:  Head: Normocephalic and atraumatic.  Mouth/Throat: Oropharynx is clear and moist. No oropharyngeal exudate.  Eyes: Conjunctivae are normal.  Right eye exhibits no discharge. Left eye exhibits no discharge. No scleral icterus.  Neck: Normal range of motion. Neck supple. No JVD present. No tracheal deviation present. No thyromegaly present.  Cardiovascular: Normal rate, regular rhythm, S1 normal, S2 normal and intact distal pulses.  Exam reveals no gallop and no friction rub.   Murmur heard.  Decrescendo systolic murmur is present with a grade of 1/6   No diastolic murmur is present  Pulmonary/Chest: Effort normal and breath sounds normal. No stridor. No respiratory distress. He has no wheezes. He has no rales. He exhibits no tenderness.  Abdominal: Soft. Bowel sounds are normal. He exhibits no distension and no mass. There is no tenderness. There is no rebound and no guarding.  Musculoskeletal: Normal range of motion. He exhibits no edema and no tenderness.  Lymphadenopathy:    He has no cervical adenopathy.  Neurological: He is oriented to person, place, and time.  Skin: Skin is warm and dry. No rash noted. He is not diaphoretic. No erythema. No pallor.  Psychiatric: He has a normal mood and affect. Judgment and thought content normal. His mood appears not anxious. His affect is not angry, not blunt, not labile and not inappropriate. His speech is delayed. His speech is not rapid and/or pressured, not tangential and not slurred. He is slowed and withdrawn. He is not agitated, not aggressive, not hyperactive, not actively hallucinating and not combative. Cognition and memory are normal. Cognition and memory are not impaired. He does not express impulsivity or inappropriate judgment. He does not exhibit a depressed mood. He is communicative. He exhibits normal recent memory and normal remote memory. He is inattentive.     Lab Results  Component Value Date   WBC 9.7 11/03/2012   HGB 10.8* 11/03/2012   HCT 32.2* 11/03/2012   PLT 144* 11/03/2012   GLUCOSE  86 02/22/2013   CHOL 123 02/22/2013   TRIG 120.0 02/22/2013   HDL 39.20 02/22/2013    LDLCALC 60 02/22/2013   ALT 31 02/22/2013   AST 19 02/22/2013   NA 139 02/22/2013   K 4.9 02/22/2013   CL 104 02/22/2013   CREATININE 1.2 02/22/2013   BUN 20 02/22/2013   CO2 28 02/22/2013   TSH 4.39 02/22/2013   INR 1.47 10/29/2012   HGBA1C 5.9 02/22/2013       Assessment & Plan:

## 2013-08-10 NOTE — Patient Instructions (Signed)

## 2013-08-10 NOTE — Assessment & Plan Note (Signed)
I don't this that there is a med that will help this

## 2013-08-14 ENCOUNTER — Other Ambulatory Visit: Payer: Self-pay | Admitting: Internal Medicine

## 2013-10-29 ENCOUNTER — Other Ambulatory Visit: Payer: Self-pay | Admitting: Internal Medicine

## 2013-12-19 ENCOUNTER — Other Ambulatory Visit: Payer: Self-pay | Admitting: Internal Medicine

## 2014-02-04 IMAGING — CR DG CHEST 2V
2 series · 2 of 2 positions shown · non-contrast
Comparison: 05/06/2010.

CLINICAL DATA: Routine check

EXAM:
CHEST  2 VIEW

[view not recorded (1 of 2)]
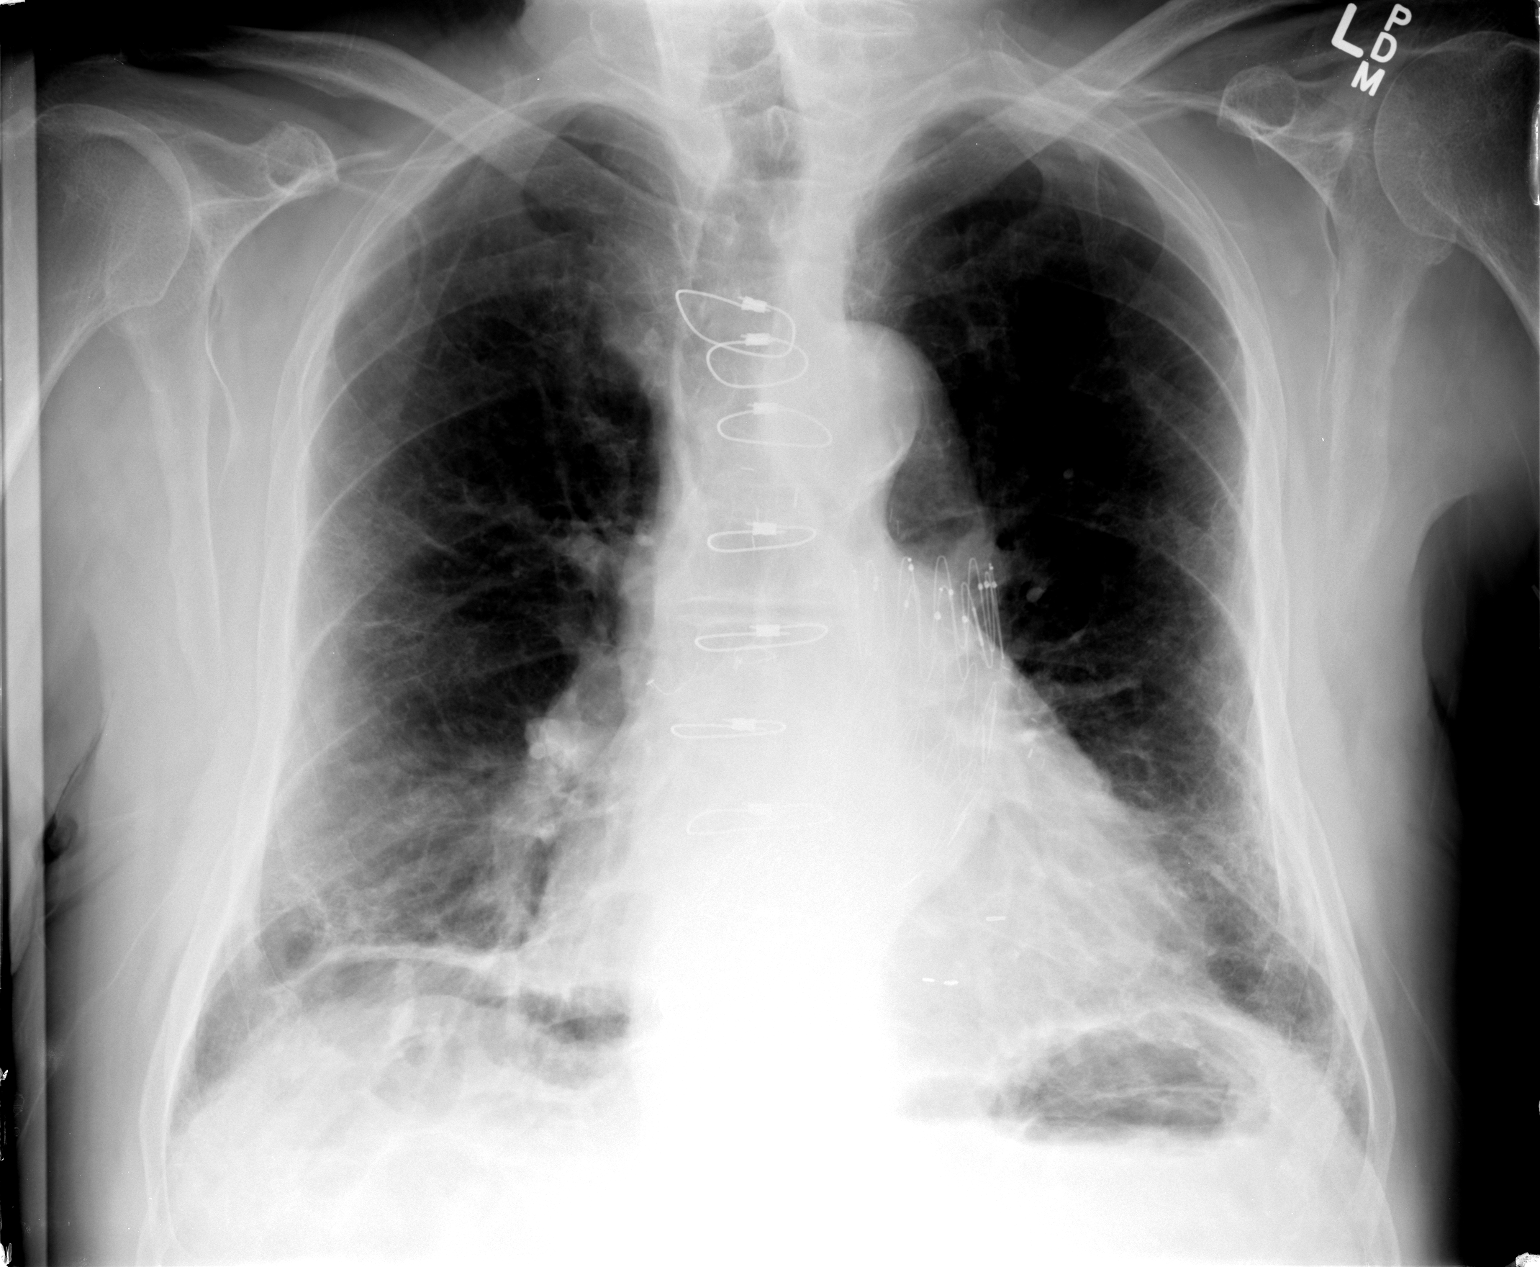

[view not recorded (2 of 2)]
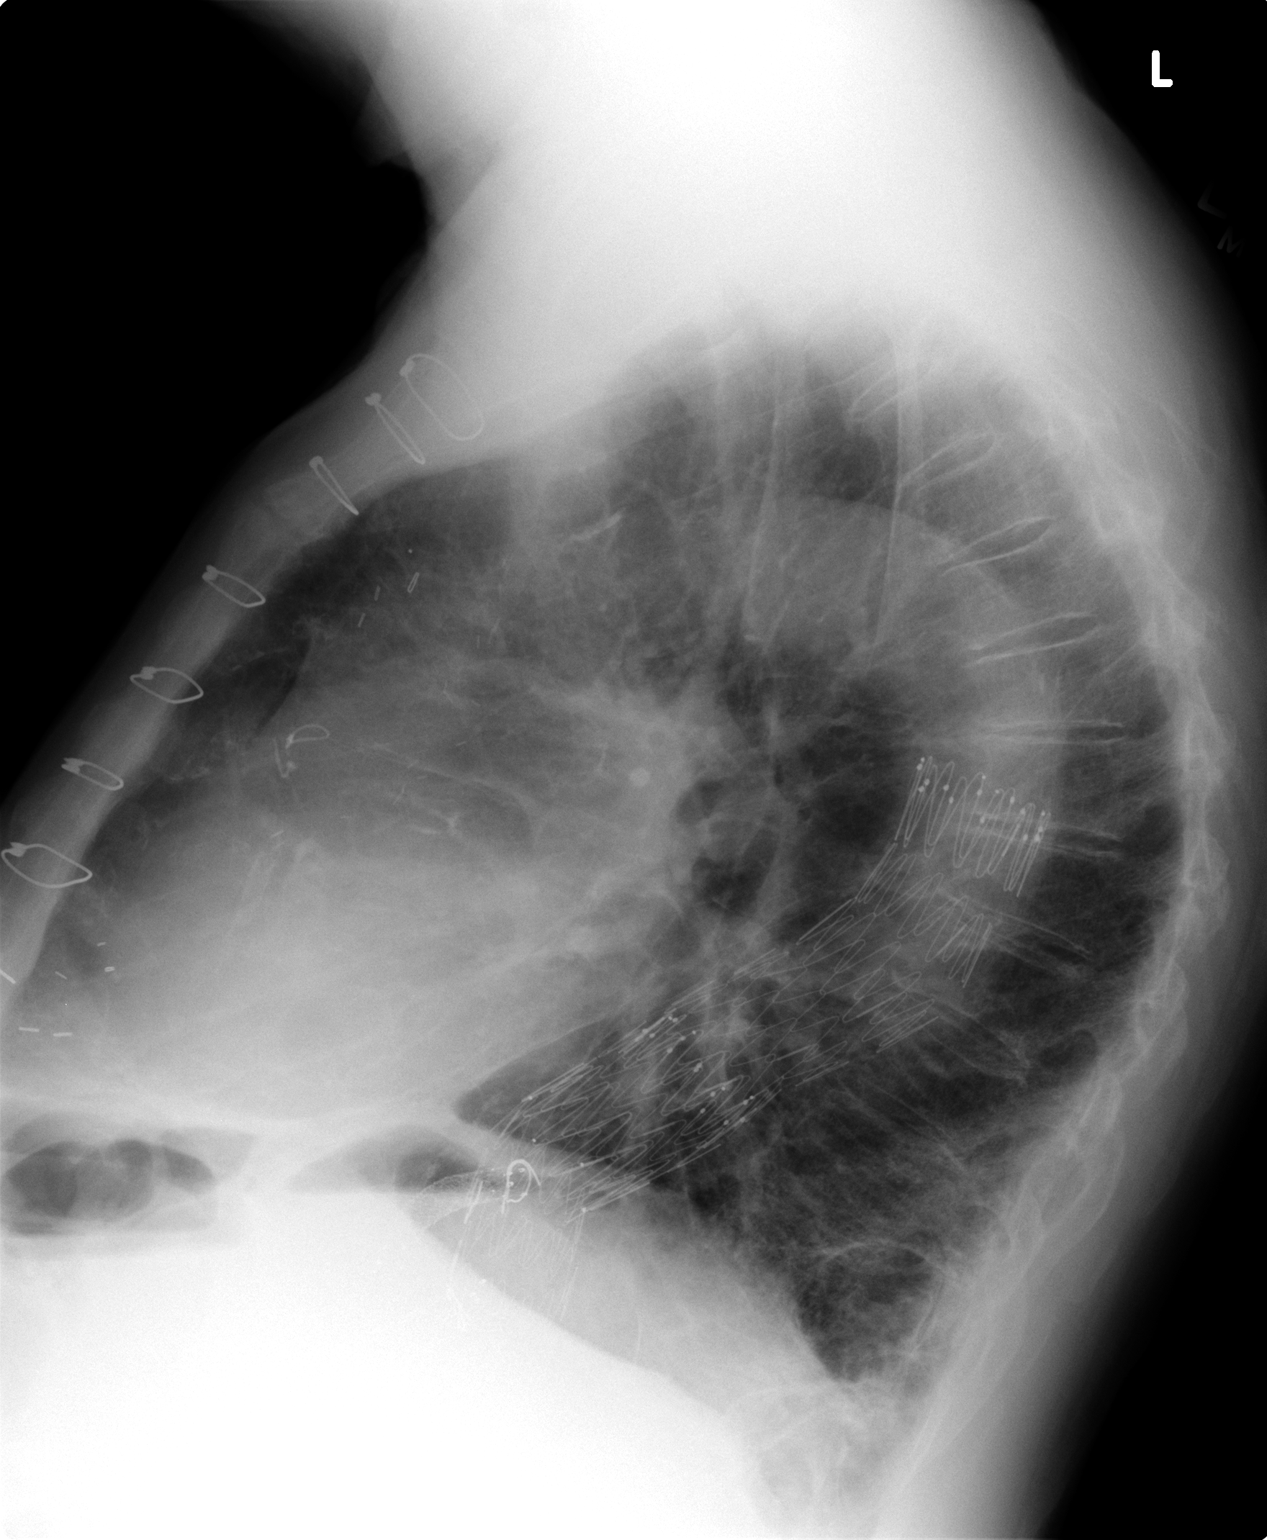

[2 of 2 positions shown; findings below may reference images not displayed]

FINDINGS: There is prominence of interstitial markings. There is no evidence
of peribronchial cuffing. No focal regions of consolidation are
identified. Vague increased density projects in the lung bases
stable. A region of air which appears to be within loops of small
bowel project under the right hemidiaphragm. Patient is status post
previous sternotomy. An aortic stent graft is identified. The
osseous structures are unremarkable.
IMPRESSION: 1. Chronic interstitial changes
2. Air likely within loops of small bowel within the right
hemidiaphragm
3. No evidence of acute abnormalities.

## 2014-02-16 ENCOUNTER — Other Ambulatory Visit: Payer: Self-pay | Admitting: Internal Medicine

## 2014-02-20 ENCOUNTER — Other Ambulatory Visit: Payer: Self-pay | Admitting: Internal Medicine

## 2014-02-27 ENCOUNTER — Other Ambulatory Visit: Payer: Self-pay | Admitting: *Deleted

## 2014-02-27 MED ORDER — RANITIDINE HCL 150 MG PO CAPS: 150.0000 mg | ORAL_CAPSULE | Freq: Two times a day (BID) | ORAL | Status: DC

## 2014-03-12 ENCOUNTER — Other Ambulatory Visit (INDEPENDENT_AMBULATORY_CARE_PROVIDER_SITE_OTHER): Payer: 59

## 2014-03-12 ENCOUNTER — Encounter: Payer: Self-pay | Admitting: Internal Medicine

## 2014-03-12 ENCOUNTER — Ambulatory Visit (INDEPENDENT_AMBULATORY_CARE_PROVIDER_SITE_OTHER): Payer: 59 | Admitting: Internal Medicine

## 2014-03-12 VITALS — BP 132/86 | HR 80 | Temp 98.5°F | Resp 16 | Ht 69.0 in | Wt 199.4 lb

## 2014-03-12 DIAGNOSIS — I10 Essential (primary) hypertension: Secondary | ICD-10-CM

## 2014-03-12 DIAGNOSIS — E785 Hyperlipidemia, unspecified: Secondary | ICD-10-CM

## 2014-03-12 DIAGNOSIS — R319 Hematuria, unspecified: Secondary | ICD-10-CM

## 2014-03-12 DIAGNOSIS — B356 Tinea cruris: Secondary | ICD-10-CM

## 2014-03-12 DIAGNOSIS — R300 Dysuria: Secondary | ICD-10-CM

## 2014-03-12 DIAGNOSIS — K219 Gastro-esophageal reflux disease without esophagitis: Secondary | ICD-10-CM

## 2014-03-12 DIAGNOSIS — R7309 Other abnormal glucose: Secondary | ICD-10-CM

## 2014-03-12 DIAGNOSIS — R739 Hyperglycemia, unspecified: Secondary | ICD-10-CM

## 2014-03-12 DIAGNOSIS — N390 Urinary tract infection, site not specified: Secondary | ICD-10-CM

## 2014-03-12 DIAGNOSIS — Z0000 Encounter for general adult medical examination without abnormal findings: Secondary | ICD-10-CM

## 2014-03-12 DIAGNOSIS — I2510 Atherosclerotic heart disease of native coronary artery without angina pectoris: Secondary | ICD-10-CM

## 2014-03-12 DIAGNOSIS — C679 Malignant neoplasm of bladder, unspecified: Secondary | ICD-10-CM

## 2014-03-12 DIAGNOSIS — L259 Unspecified contact dermatitis, unspecified cause: Secondary | ICD-10-CM | POA: Insufficient documentation

## 2014-03-12 LAB — CBC WITH DIFFERENTIAL/PLATELET
Basophils Absolute: 0.1 10*3/uL (ref 0.0–0.1)
Basophils Relative: 0.9 % (ref 0.0–3.0)
Eosinophils Absolute: 0.3 10*3/uL (ref 0.0–0.7)
Eosinophils Relative: 2.5 % (ref 0.0–5.0)
HCT: 43.3 % (ref 39.0–52.0)
Hemoglobin: 14.4 g/dL (ref 13.0–17.0)
Lymphocytes Relative: 23.7 % (ref 12.0–46.0)
Lymphs Abs: 2.6 10*3/uL (ref 0.7–4.0)
MCHC: 33.2 g/dL (ref 30.0–36.0)
MCV: 98.4 fl (ref 78.0–100.0)
Monocytes Absolute: 0.8 10*3/uL (ref 0.1–1.0)
Monocytes Relative: 7.6 % (ref 3.0–12.0)
Neutro Abs: 7.1 10*3/uL (ref 1.4–7.7)
Neutrophils Relative %: 65.3 % (ref 43.0–77.0)
Platelets: 208 10*3/uL (ref 150.0–400.0)
RBC: 4.4 Mil/uL (ref 4.22–5.81)
RDW: 13.5 % (ref 11.5–15.5)
WBC: 10.9 10*3/uL — ABNORMAL HIGH (ref 4.0–10.5)

## 2014-03-12 LAB — COMPREHENSIVE METABOLIC PANEL
ALBUMIN: 4.2 g/dL (ref 3.5–5.2)
ALK PHOS: 89 U/L (ref 39–117)
ALT: 18 U/L (ref 0–53)
AST: 20 U/L (ref 0–37)
BUN: 27 mg/dL — ABNORMAL HIGH (ref 6–23)
CO2: 28 meq/L (ref 19–32)
Calcium: 10.1 mg/dL (ref 8.4–10.5)
Chloride: 102 mEq/L (ref 96–112)
Creatinine, Ser: 1.5 mg/dL (ref 0.4–1.5)
GFR: 46.79 mL/min — ABNORMAL LOW (ref >60.00)
GLUCOSE: 73 mg/dL (ref 70–99)
POTASSIUM: 4.7 meq/L (ref 3.5–5.1)
SODIUM: 137 meq/L (ref 135–145)
TOTAL PROTEIN: 8 g/dL (ref 6.0–8.3)
Total Bilirubin: 0.9 mg/dL (ref 0.2–1.2)

## 2014-03-12 LAB — URINALYSIS, ROUTINE W REFLEX MICROSCOPIC
Bilirubin Urine: NEGATIVE
Ketones, ur: NEGATIVE
Nitrite: POSITIVE — AB
SPECIFIC GRAVITY, URINE: 1.015 (ref 1.000–1.030)
Urine Glucose: NEGATIVE
Urobilinogen, UA: 0.2 (ref 0.0–1.0)
pH: 7 (ref 5.0–8.0)

## 2014-03-12 LAB — LIPID PANEL
Cholesterol: 147 mg/dL (ref 0–200)
HDL: 31.7 mg/dL — ABNORMAL LOW (ref >39.00)
LDL Cholesterol: 67 mg/dL (ref 0–99)
NonHDL: 115.3
Total CHOL/HDL Ratio: 5
Triglycerides: 244 mg/dL — ABNORMAL HIGH (ref 0.0–149.0)
VLDL: 48.8 mg/dL — ABNORMAL HIGH (ref 0.0–40.0)

## 2014-03-12 LAB — HEMOGLOBIN A1C: Hgb A1c MFr Bld: 6.1 % (ref 4.6–6.5)

## 2014-03-12 LAB — TSH: TSH: 2.81 u[IU]/mL (ref 0.35–4.50)

## 2014-03-12 MED ORDER — CIPROFLOXACIN HCL 500 MG PO TABS: 500.0000 mg | ORAL_TABLET | Freq: Two times a day (BID) | ORAL | Status: AC

## 2014-03-12 MED ORDER — KETOCONAZOLE 2 % EX CREA: 1.0000 "application " | TOPICAL_CREAM | Freq: Every day | CUTANEOUS | Status: DC

## 2014-03-12 MED ORDER — RANITIDINE HCL 150 MG PO TABS: 150.0000 mg | ORAL_TABLET | Freq: Two times a day (BID) | ORAL | Status: DC

## 2014-03-12 NOTE — Assessment & Plan Note (Addendum)

## 2014-03-12 NOTE — Patient Instructions (Signed)
Yeast Infection of the Skin Some yeast on the skin is normal, but sometimes it causes an infection. If you have a yeast infection, it shows up as white or light brown patches on brown skin. You can see it better in the summer on tan skin. It causes light-colored holes in your suntan. It can happen on any area of the body. This cannot be passed from person to person. HOME CARE  Scrub your skin daily with a dandruff shampoo. Your rash may take a couple weeks to get well.  Do not scratch or itch the rash. GET HELP RIGHT AWAY IF:   You get another infection from scratching. The skin may get warm, red, and may ooze fluid.  The infection does not seem to be getting better. MAKE SURE YOU:  Understand these instructions.  Will watch your condition.  Will get help right away if you are not doing well or get worse. Document Released: 09/03/2008 Document Revised: 12/14/2011 Document Reviewed: 09/03/2008 [employer] Patient Information 2014 [employer].  

## 2014-03-12 NOTE — Assessment & Plan Note (Signed)
will treat with [facility]

## 2014-03-12 NOTE — Assessment & Plan Note (Signed)
His BP is well controlled 

## 2014-03-12 NOTE — Assessment & Plan Note (Signed)
His UA is abnormal Will treat with cipro

## 2014-03-12 NOTE — Assessment & Plan Note (Signed)
He is doing well on lipitor I will check his FLP today

## 2014-03-12 NOTE — Progress Notes (Signed)
Subjective:    Patient ID: Miguel Swanson, male    DOB: 1926/01/01, 80 y.o.   MRN: [id number]  Hematuria This is a new problem. The current episode started in the past 7 days. The problem is unchanged. He describes the hematuria as gross hematuria. The hematuria occurs throughout his entire urinary stream. He reports no clotting in his urine stream. His pain is at a severity of 1/10. The pain is mild. He describes his urine color as dark red. Irritative symptoms do not include frequency, nocturia or urgency. Obstructive symptoms do not include dribbling, incomplete emptying, an intermittent stream, a slower stream, straining or a weak stream. Associated symptoms include dysuria. Pertinent negatives include no abdominal pain, chills, facial swelling, fever, flank pain, genital pain, hesitancy, inability to urinate, nausea or vomiting. He is not sexually active.      Review of Systems  Constitutional: Negative.  Negative for fever, chills, diaphoresis, appetite change and fatigue.  HENT: Negative.  Negative for facial swelling.   Eyes: Negative.   Respiratory: Negative.  Negative for cough, choking, chest tightness, shortness of breath and stridor.   Cardiovascular: Negative.  Negative for chest pain, palpitations and leg swelling.  Gastrointestinal: Negative.  Negative for nausea, vomiting, abdominal pain, diarrhea, constipation, blood in stool, abdominal distention and anal bleeding.  Endocrine: Negative.   Genitourinary: Positive for dysuria, hematuria and penile pain. Negative for hesitancy, urgency, frequency, flank pain, decreased urine volume, discharge, penile swelling, scrotal swelling, enuresis, difficulty urinating, genital sores, testicular pain, incomplete emptying and nocturia.  Musculoskeletal: Negative.   Skin: Negative.   Allergic/Immunologic: Negative.   Neurological: Negative.   Hematological: Negative.  Negative for adenopathy. Does not bruise/bleed easily.    Psychiatric/Behavioral: Positive for behavioral problems, confusion and decreased concentration. Negative for suicidal ideas, hallucinations, sleep disturbance, self-injury and dysphoric mood. The patient is not nervous/anxious and is not hyperactive.        Objective:   Physical Exam  Vitals reviewed. Constitutional: He is oriented to person, place, and time. He appears well-developed and well-nourished.  Non-toxic appearance. He does not have a sickly appearance. He does not appear ill. No distress.  HENT:  Head: Normocephalic and atraumatic.  Mouth/Throat: Oropharynx is clear and moist. No oropharyngeal exudate.  Eyes: Conjunctivae are normal. Right eye exhibits no discharge. Left eye exhibits no discharge. No scleral icterus.  Neck: Normal range of motion. Neck supple. No JVD present. No tracheal deviation present. No thyromegaly present.  Cardiovascular: Normal rate, regular rhythm, normal heart sounds and intact distal pulses.  Exam reveals no gallop and no friction rub.   No murmur heard. Pulmonary/Chest: Effort normal and breath sounds normal. No stridor. No respiratory distress. He has no wheezes. He has no rales. He exhibits no tenderness.  Abdominal: Soft. Bowel sounds are normal. He exhibits no distension and no mass. There is no tenderness. There is no rebound and no guarding. Hernia confirmed negative in the right inguinal area and confirmed negative in the left inguinal area.  Genitourinary: Rectum normal, testes normal and penis normal. Rectal exam shows no external hemorrhoid, no internal hemorrhoid, no fissure, no mass, no tenderness and anal tone normal. Guaiac negative stool.    Prostate is enlarged. Prostate is not tender. Right testis shows no mass, no swelling and no tenderness. Right testis is descended. Left testis shows no mass, no swelling and no tenderness. Left testis is descended. Uncircumcised. No penile erythema or penile tenderness. No discharge found.   Musculoskeletal: Normal range of motion.  He exhibits no edema and no tenderness.  Lymphadenopathy:    He has no cervical adenopathy.       Right: No inguinal adenopathy present.       Left: No inguinal adenopathy present.  Neurological: He is oriented to person, place, and time.  Skin: Skin is warm and dry. No rash noted. He is not diaphoretic. No erythema. No pallor.  Psychiatric: Judgment and thought content normal. His mood appears not anxious. His affect is not angry, not blunt and not inappropriate. His speech is delayed and tangential. His speech is not rapid and/or pressured and not slurred. He is slowed and withdrawn. He is not agitated, not aggressive, not hyperactive, not actively hallucinating and not combative. Cognition and memory are impaired. He does not exhibit a depressed mood. He is communicative. He exhibits abnormal recent memory and abnormal remote memory. He is inattentive.    Lab Results  Component Value Date   WBC 7.6 08/10/2013   HGB 13.7 08/10/2013   HCT 40.4 08/10/2013   PLT 153.0 08/10/2013   GLUCOSE 86 02/22/2013   CHOL 123 02/22/2013   TRIG 120.0 02/22/2013   HDL 39.20 02/22/2013   LDLCALC 60 02/22/2013   ALT 31 02/22/2013   AST 19 02/22/2013   NA 139 02/22/2013   K 4.9 02/22/2013   CL 104 02/22/2013   CREATININE 1.2 02/22/2013   BUN 20 02/22/2013   CO2 28 02/22/2013   TSH 4.39 02/22/2013   INR 1.47 10/29/2012   HGBA1C 5.9 02/22/2013        Assessment & Plan:

## 2014-03-12 NOTE — Progress Notes (Signed)
Pre visit review using our clinic review tool, if applicable. No additional management support is needed unless otherwise documented below in the visit note. 

## 2014-03-12 NOTE — Assessment & Plan Note (Signed)
I have asked him to follow up with urology about this

## 2014-03-12 NOTE — Assessment & Plan Note (Signed)
I will check his A1C to see if he has developed DM2 

## 2014-04-09 ENCOUNTER — Ambulatory Visit (INDEPENDENT_AMBULATORY_CARE_PROVIDER_SITE_OTHER): Payer: 59 | Admitting: Internal Medicine

## 2014-04-09 ENCOUNTER — Encounter: Payer: Self-pay | Admitting: Internal Medicine

## 2014-04-09 VITALS — BP 120/64 | HR 64 | Temp 98.2°F | Resp 16 | Ht 69.0 in | Wt 198.0 lb

## 2014-04-09 DIAGNOSIS — N3001 Acute cystitis with hematuria: Secondary | ICD-10-CM

## 2014-04-09 DIAGNOSIS — N3000 Acute cystitis without hematuria: Secondary | ICD-10-CM

## 2014-04-09 DIAGNOSIS — K219 Gastro-esophageal reflux disease without esophagitis: Secondary | ICD-10-CM

## 2014-04-09 DIAGNOSIS — C679 Malignant neoplasm of bladder, unspecified: Secondary | ICD-10-CM

## 2014-04-09 DIAGNOSIS — L259 Unspecified contact dermatitis, unspecified cause: Secondary | ICD-10-CM

## 2014-04-09 MED ORDER — TRIAMCINOLONE ACETONIDE 0.5 % EX CREA: 1.0000 "application " | TOPICAL_CREAM | Freq: Three times a day (TID) | CUTANEOUS | Status: DC

## 2014-04-09 NOTE — Progress Notes (Signed)
Subjective:    Patient ID: Miguel Swanson, male    DOB: 04-24-1926, 80 y.o.   MRN: [id number]  Rash This is a recurrent problem. The current episode started 1 to 4 weeks ago. The problem is unchanged. The affected locations include the genitalia. The rash is characterized by itchiness, dryness and redness. He was exposed to a new medication. Pertinent negatives include no anorexia, congestion, cough, diarrhea, eye pain, facial edema, fatigue, fever, joint pain, nail changes, rhinorrhea, shortness of breath, sore throat or vomiting. Treatments tried: ketoconazole cream. The treatment provided no relief. There is no history of allergies, asthma, eczema or varicella.      Review of Systems  Constitutional: Negative.  Negative for fever, chills, diaphoresis, appetite change and fatigue.  HENT: Negative.  Negative for congestion, rhinorrhea and sore throat.   Eyes: Negative.  Negative for pain.  Respiratory: Negative.  Negative for cough, choking, chest tightness and shortness of breath.   Cardiovascular: Negative.  Negative for chest pain, palpitations and leg swelling.  Gastrointestinal: Negative.  Negative for nausea, vomiting, abdominal pain, diarrhea, blood in stool and anorexia.  Endocrine: Negative.   Genitourinary: Negative.  Negative for dysuria, urgency, frequency, hematuria, flank pain, decreased urine volume, penile swelling, scrotal swelling, difficulty urinating, penile pain and testicular pain.  Musculoskeletal: Negative.  Negative for joint pain.  Skin: Positive for rash. Negative for nail changes.  Allergic/Immunologic: Negative.   Neurological: Negative.   Hematological: Negative.  Negative for adenopathy. Does not bruise/bleed easily.  Psychiatric/Behavioral: Negative.        Objective:   Physical Exam  Vitals reviewed. Constitutional: He is oriented to person, place, and time. He appears well-developed and well-nourished. No distress.  HENT:  Head: Normocephalic  and atraumatic.  Mouth/Throat: Oropharynx is clear and moist. No oropharyngeal exudate.  Eyes: Conjunctivae are normal. Right eye exhibits no discharge. Left eye exhibits no discharge. No scleral icterus.  Neck: Normal range of motion. Neck supple. No JVD present. No tracheal deviation present. No thyromegaly present.  Cardiovascular: Normal rate, regular rhythm, normal heart sounds and intact distal pulses.  Exam reveals no gallop and no friction rub.   No murmur heard. Pulmonary/Chest: Effort normal and breath sounds normal. No stridor. No respiratory distress. He has no wheezes. He has no rales. He exhibits no tenderness.  Abdominal: Soft. Bowel sounds are normal. He exhibits no distension and no mass. There is no tenderness. There is no rebound and no guarding. Hernia confirmed negative in the right inguinal area and confirmed negative in the left inguinal area.  Genitourinary: Testes normal and penis normal.    Right testis shows no mass, no swelling and no tenderness. Right testis is descended. Left testis shows no mass, no swelling and no tenderness. Left testis is descended. Uncircumcised. No phimosis, paraphimosis, hypospadias, penile erythema or penile tenderness. No discharge found.  Musculoskeletal: Normal range of motion. He exhibits no edema and no tenderness.  Lymphadenopathy:    He has no cervical adenopathy.       Right: No inguinal adenopathy present.       Left: No inguinal adenopathy present.  Neurological: He is oriented to person, place, and time.  Skin: Skin is warm and dry. Rash noted. He is not diaphoretic. There is erythema. No pallor.     Lab Results  Component Value Date   WBC 10.9* 03/12/2014   HGB 14.4 03/12/2014   HCT 43.3 03/12/2014   PLT 208.0 03/12/2014   GLUCOSE 73 03/12/2014   CHOL  147 03/12/2014   TRIG 244.0* 03/12/2014   HDL 31.70* 03/12/2014   LDLCALC 67 03/12/2014   ALT 18 03/12/2014   AST 20 03/12/2014   NA 137 03/12/2014   K 4.7 03/12/2014   CL 102 03/12/2014    CREATININE 1.5 03/12/2014   BUN 27* 03/12/2014   CO2 28 03/12/2014   TSH 2.81 03/12/2014   INR 1.47 10/29/2012   HGBA1C 6.1 03/12/2014       Assessment & Plan:

## 2014-04-09 NOTE — Patient Instructions (Signed)

## 2014-04-09 NOTE — Progress Notes (Signed)
Pre visit review using our clinic review tool, if applicable. No additional management support is needed unless otherwise documented below in the visit note. 

## 2014-04-10 NOTE — Assessment & Plan Note (Signed)
He may be allergic to the ketoconazole, I have asked him to start using TAC cream

## 2014-04-10 NOTE — Assessment & Plan Note (Signed)
This has resolved.

## 2014-04-10 NOTE — Assessment & Plan Note (Signed)
He sees urology later this week about this

## 2014-04-30 ENCOUNTER — Ambulatory Visit: Payer: 59 | Admitting: Internal Medicine

## 2014-05-03 LAB — TSH
ALT: 16 U/L (ref 10–40)
AST: 14 U/L
Albumin, Serum(Neph): 4.4
Albumin/Globulin Ratio: 1.5
Alkaline Phosphatase: 90 U/L
BUN/Creatinine Ratio: 18
BUN: 25
Bilirubin, Total: 0.9
CALCIUM: 9.4 mg/dL
CHLORIDE, SERUM: 99
CREATINE, SERUM: 1.4
EGFR: 45 mg/dL
EGFR: 52 mg/dL
GLOBULIN, TOTAL: 3
Glucose: 100
Potassium, serum: 4.7
Protein S Total: 7.4
SODIUM, SERUM: 140

## 2014-05-03 LAB — COMPREHENSIVE METABOLIC PANEL
BASO: 1 %
Basophils Absolute: 1 /uL
EOS%: 3 %
Eosinophils Absolute: 0 /uL
GRANULOCYTE COUNT ABSOLUTE: 0
Granulocytes:: 0
HEMATOCRIT: 42 %
HEMOGLOBIN FL: 14.6
IMMATURE CELLS: 4.2
LYMPHS FL: 33 %
Lymphs(Absolute): 2.4
MCH: 32.7
MCHC: 34.4
MCV: 95 fL
Monocyte Count, Fluid: 7
Monocytes(Absolute): 0.5
Neutrophil Count, Fluid: 56 %
Neutrophils absolute (GR#): 4.2 10*3/uL (ref 1.7–7.7)
Platelets: 165
RBC: 4.46
RDW: 13.7
WBC: 7.4

## 2014-05-03 LAB — LIPID PANEL
Cholesterol, Total: 131
HDL Particle Number: 24.5
HDL-C: 30
LDL CALC: 74 mg/dL
LDL Pattern: 897
LDL SIZE: 20
Small LDL Particle Number: 622
Triglycerides: 133

## 2014-05-05 ENCOUNTER — Other Ambulatory Visit: Payer: Self-pay | Admitting: Internal Medicine

## 2014-05-07 ENCOUNTER — Ambulatory Visit (INDEPENDENT_AMBULATORY_CARE_PROVIDER_SITE_OTHER): Payer: 59 | Admitting: Internal Medicine

## 2014-05-07 ENCOUNTER — Encounter: Payer: Self-pay | Admitting: Internal Medicine

## 2014-05-07 VITALS — BP 120/80 | HR 63 | Temp 98.2°F | Resp 16 | Ht 69.0 in | Wt 195.0 lb

## 2014-05-07 DIAGNOSIS — Z23 Encounter for immunization: Secondary | ICD-10-CM

## 2014-05-07 DIAGNOSIS — L259 Unspecified contact dermatitis, unspecified cause: Secondary | ICD-10-CM

## 2014-05-07 DIAGNOSIS — I10 Essential (primary) hypertension: Secondary | ICD-10-CM

## 2014-05-07 NOTE — Progress Notes (Signed)
Subjective:    Patient ID: Miguel Swanson, male    DOB: 1926-09-26, 80 y.o.   MRN: [id number]  Rash This is a recurrent problem. The problem has been resolved since onset. The affected locations include the groin. The rash is characterized by itchiness. He was exposed to nothing. Pertinent negatives include no anorexia, congestion, cough, diarrhea, eye pain, facial edema, fatigue, fever, joint pain, nail changes, rhinorrhea, shortness of breath, sore throat or vomiting. Past treatments include topical steroids.      Review of Systems  Constitutional: Negative.  Negative for fever, chills, diaphoresis, appetite change and fatigue.  HENT: Negative.  Negative for congestion, rhinorrhea and sore throat.   Eyes: Negative.  Negative for pain.  Respiratory: Negative.  Negative for cough, choking, chest tightness, shortness of breath and stridor.   Cardiovascular: Negative.  Negative for chest pain, palpitations and leg swelling.  Gastrointestinal: Negative.  Negative for nausea, vomiting, abdominal pain, diarrhea, constipation, blood in stool and anorexia.  Endocrine: Negative.   Genitourinary: Negative.   Musculoskeletal: Negative.  Negative for arthralgias, back pain, joint pain, myalgias and neck pain.  Skin: Positive for rash. Negative for nail changes, color change, pallor and wound.  Allergic/Immunologic: Negative.   Neurological: Negative.   Hematological: Negative.  Negative for adenopathy. Does not bruise/bleed easily.  Psychiatric/Behavioral: Negative.        Objective:   Physical Exam  Vitals reviewed. Constitutional: He is oriented to person, place, and time. He appears well-developed and well-nourished. No distress.  HENT:  Head: Normocephalic and atraumatic.  Mouth/Throat: Oropharynx is clear and moist. No oropharyngeal exudate.  Eyes: Conjunctivae are normal. Right eye exhibits no discharge. Left eye exhibits no discharge. No scleral icterus.  Neck: Normal range of  motion. Neck supple. No JVD present. No tracheal deviation present. No thyromegaly present.  Cardiovascular: Normal rate, regular rhythm, S1 normal, S2 normal and intact distal pulses.  Exam reveals no gallop, no S3, no S4 and no friction rub.   Murmur heard.  Decrescendo systolic murmur is present with a grade of 1/6   No diastolic murmur is present  Pulmonary/Chest: Effort normal and breath sounds normal. No stridor. No respiratory distress. He has no wheezes. He has no rales. He exhibits no tenderness.  Abdominal: Soft. Bowel sounds are normal. He exhibits no distension and no mass. There is no tenderness. There is no rebound and no guarding.  Musculoskeletal: Normal range of motion. He exhibits no edema and no tenderness.  Lymphadenopathy:    He has no cervical adenopathy.  Neurological: He is oriented to person, place, and time.  Skin: Skin is warm and dry. No rash noted. He is not diaphoretic. No erythema. No pallor.  Psychiatric: He has a normal mood and affect. His behavior is normal. Judgment and thought content normal.    Lab Results  Component Value Date   WBC 7.4 04/20/2014   HGB 14.4 03/12/2014   HCT 42 04/20/2014   PLT 208.0 03/12/2014   GLUCOSE 73 03/12/2014   CHOL 147 03/12/2014   TRIG 133 04/20/2014   HDL 31.70* 03/12/2014   LDLCALC 74 04/20/2014   ALT 16 04/20/2014   AST 14 04/20/2014   NA 137 03/12/2014   K 4.7 03/12/2014   CL 102 03/12/2014   CREATININE 1.5 03/12/2014   BUN 25 04/20/2014   CO2 28 03/12/2014   TSH 2.81 03/12/2014   INR 1.47 10/29/2012   HGBA1C 6.1 03/12/2014        Assessment & Plan:

## 2014-05-07 NOTE — Progress Notes (Signed)
Pre visit review using our clinic review tool, if applicable. No additional management support is needed unless otherwise documented below in the visit note. 

## 2014-05-09 ENCOUNTER — Encounter: Payer: Self-pay | Admitting: Internal Medicine

## 2014-05-09 NOTE — Assessment & Plan Note (Signed)
His BP is well controlled 

## 2014-05-09 NOTE — Assessment & Plan Note (Signed)
This has resolved.

## 2014-05-15 ENCOUNTER — Other Ambulatory Visit: Payer: Self-pay | Admitting: Internal Medicine

## 2014-05-28 ENCOUNTER — Other Ambulatory Visit: Payer: Self-pay | Admitting: Internal Medicine

## 2014-07-26 ENCOUNTER — Other Ambulatory Visit: Payer: Self-pay | Admitting: Internal Medicine

## 2014-08-02 ENCOUNTER — Encounter: Payer: Self-pay | Admitting: Internal Medicine

## 2014-08-02 ENCOUNTER — Ambulatory Visit (INDEPENDENT_AMBULATORY_CARE_PROVIDER_SITE_OTHER): Payer: 59 | Admitting: Internal Medicine

## 2014-08-02 VITALS — BP 110/58 | HR 86 | Temp 97.9°F | Resp 16 | Ht 69.0 in | Wt 196.0 lb

## 2014-08-02 DIAGNOSIS — Z0000 Encounter for general adult medical examination without abnormal findings: Secondary | ICD-10-CM

## 2014-08-02 DIAGNOSIS — F17200 Nicotine dependence, unspecified, uncomplicated: Secondary | ICD-10-CM

## 2014-08-02 DIAGNOSIS — Z23 Encounter for immunization: Secondary | ICD-10-CM

## 2014-08-02 DIAGNOSIS — I10 Essential (primary) hypertension: Secondary | ICD-10-CM

## 2014-08-02 DIAGNOSIS — I739 Peripheral vascular disease, unspecified: Secondary | ICD-10-CM

## 2014-08-02 DIAGNOSIS — E785 Hyperlipidemia, unspecified: Secondary | ICD-10-CM

## 2014-08-02 DIAGNOSIS — R739 Hyperglycemia, unspecified: Secondary | ICD-10-CM

## 2014-08-02 LAB — FECAL OCCULT BLOOD, GUAIAC: Fecal Occult Blood: NEGATIVE

## 2014-08-02 NOTE — Progress Notes (Signed)
   Subjective:    Patient ID: Miguel Swanson, male    DOB: September 10, 1926, 80 y.o.   MRN: [id number]  HPI Comments: He is seen today for annual physical and medicare wellness exam.     Review of Systems  All other systems reviewed and are negative.      Objective:   Physical Exam  Constitutional: He is oriented to person, place, and time. He appears well-developed and well-nourished. No distress.  HENT:  Head: Normocephalic and atraumatic.  Mouth/Throat: Oropharynx is clear and moist. No oropharyngeal exudate.  Eyes: Conjunctivae are normal. Right eye exhibits no discharge. Left eye exhibits no discharge. No scleral icterus.  Neck: Normal range of motion. Neck supple. No JVD present. No tracheal deviation present. No thyromegaly present.  Cardiovascular: Normal rate, regular rhythm, normal heart sounds and intact distal pulses.  Exam reveals no gallop and no friction rub.   No murmur heard. Pulmonary/Chest: Effort normal and breath sounds normal. No stridor. No respiratory distress. He has no wheezes. He has no rales. He exhibits no tenderness.  Abdominal: Soft. Bowel sounds are normal. He exhibits no distension and no mass. There is no tenderness. There is no rebound and no guarding. Hernia confirmed negative in the right inguinal area and confirmed negative in the left inguinal area.  Genitourinary: Rectum normal, prostate normal, testes normal and penis normal. Rectal exam shows no external hemorrhoid, no internal hemorrhoid, no fissure, no mass, no tenderness and anal tone normal. Guaiac negative stool. Prostate is not enlarged and not tender. Right testis shows no mass, no swelling and no tenderness. Right testis is descended. Left testis shows no mass, no swelling and no tenderness. Left testis is descended. Circumcised. No phimosis, paraphimosis, hypospadias, penile erythema or penile tenderness. No discharge found.  Musculoskeletal: Normal range of motion. He exhibits no edema or  tenderness.  Lymphadenopathy:    He has no cervical adenopathy.       Right: No inguinal adenopathy present.       Left: No inguinal adenopathy present.  Neurological: He is oriented to person, place, and time.  Skin: Skin is warm and dry. No rash noted. He is not diaphoretic. No erythema. No pallor.  Psychiatric: He has a normal mood and affect. Judgment and thought content normal. His mood appears not anxious. His affect is not angry, not blunt, not labile and not inappropriate. His speech is delayed and tangential. His speech is not rapid and/or pressured and not slurred. He is slowed and withdrawn. He is not agitated, not aggressive, not hyperactive, not actively hallucinating and not combative. Cognition and memory are impaired. He does not exhibit a depressed mood. He is noncommunicative. He exhibits abnormal recent memory and abnormal remote memory. He is inattentive.  Vitals reviewed.    Lab Results  Component Value Date   WBC 7.4 04/20/2014   HGB 14.4 03/12/2014   HCT 42 04/20/2014   PLT 208.0 03/12/2014   GLUCOSE 73 03/12/2014   CHOL 147 03/12/2014   TRIG 133 04/20/2014   HDL 31.70* 03/12/2014   LDLCALC 74 04/20/2014   ALT 16 04/20/2014   AST 14 04/20/2014   NA 137 03/12/2014   K 4.7 03/12/2014   CL 102 03/12/2014   CREATININE 1.5 03/12/2014   BUN 25 04/20/2014   CO2 28 03/12/2014   TSH 2.81 03/12/2014   INR 1.47 10/29/2012   HGBA1C 6.1 03/12/2014       Assessment & Plan:

## 2014-08-02 NOTE — Patient Instructions (Signed)

## 2014-08-02 NOTE — Progress Notes (Signed)
Pre visit review using our clinic review tool, if applicable. No additional management support is needed unless otherwise documented below in the visit note. 

## 2014-08-03 ENCOUNTER — Telehealth: Payer: Self-pay | Admitting: Internal Medicine

## 2014-08-03 DIAGNOSIS — Z23 Encounter for immunization: Secondary | ICD-10-CM

## 2014-08-03 NOTE — Telephone Encounter (Signed)
emmi emailed °

## 2014-08-06 NOTE — Assessment & Plan Note (Signed)

## 2014-08-08 ENCOUNTER — Other Ambulatory Visit: Payer: Self-pay | Admitting: Internal Medicine

## 2014-08-23 LAB — HEPATIC FUNCTION PANEL
ALT: 22 U/L (ref 10–40)
AST: 18 U/L (ref 14–40)
Alkaline Phosphatase: 113 U/L (ref 25–125)
BILIRUBIN, TOTAL: 0.6 mg/dL

## 2014-08-23 LAB — LIPID PANEL
Cholesterol: 148 mg/dL (ref 0–200)
HDL: 24 mg/dL — AB (ref 35–70)
LDL CALC: 44 mg/dL
TRIGLYCERIDES: 389 mg/dL — AB (ref 40–160)

## 2014-08-23 LAB — CBC AND DIFFERENTIAL
HCT: 38 % — AB (ref 41–53)
Hemoglobin: 13.4 g/dL — AB (ref 13.5–17.5)
Neutrophils Absolute: 4 /uL
PLATELETS: 165 10*3/uL (ref 150–399)
WBC: 7.8 10*3/mL

## 2014-08-23 LAB — BASIC METABOLIC PANEL
BUN: 28 mg/dL — AB (ref 4–21)
Creatinine: 1.6 mg/dL — AB (ref <=1.3)
Glucose: 82 mg/dL
Potassium: 4.9 mmol/L (ref 3.4–5.3)
Sodium: 139 mmol/L (ref 137–147)

## 2014-08-27 DIAGNOSIS — I4891 Unspecified atrial fibrillation: Secondary | ICD-10-CM

## 2014-08-28 ENCOUNTER — Ambulatory Visit (HOSPITAL_COMMUNITY): Payer: Medicare Other | Admitting: Certified Registered"

## 2014-08-28 ENCOUNTER — Encounter (HOSPITAL_COMMUNITY)
Admission: RE | Disposition: A | Discharge status: Home / Self Care | Payer: Self-pay | Source: Ambulatory Visit | Attending: Cardiology

## 2014-08-28 ENCOUNTER — Ambulatory Visit (HOSPITAL_COMMUNITY)
Admission: RE | Admit: 2014-08-28 | Discharge: 2014-08-28 | Disposition: A | Discharge status: Home / Self Care | Payer: Medicare Other | Source: Ambulatory Visit | Attending: Cardiology | Admitting: Cardiology

## 2014-08-28 ENCOUNTER — Encounter (HOSPITAL_COMMUNITY): Payer: Self-pay

## 2014-08-28 DIAGNOSIS — Z951 Presence of aortocoronary bypass graft: Secondary | ICD-10-CM | POA: Insufficient documentation

## 2014-08-28 DIAGNOSIS — I2510 Atherosclerotic heart disease of native coronary artery without angina pectoris: Secondary | ICD-10-CM | POA: Insufficient documentation

## 2014-08-28 DIAGNOSIS — I509 Heart failure, unspecified: Secondary | ICD-10-CM | POA: Insufficient documentation

## 2014-08-28 DIAGNOSIS — M1990 Unspecified osteoarthritis, unspecified site: Secondary | ICD-10-CM | POA: Insufficient documentation

## 2014-08-28 DIAGNOSIS — I739 Peripheral vascular disease, unspecified: Secondary | ICD-10-CM | POA: Diagnosis not present

## 2014-08-28 DIAGNOSIS — I10 Essential (primary) hypertension: Secondary | ICD-10-CM | POA: Insufficient documentation

## 2014-08-28 DIAGNOSIS — I4891 Unspecified atrial fibrillation: Secondary | ICD-10-CM | POA: Diagnosis present

## 2014-08-28 HISTORY — PX: CARDIOVERSION: SHX1299

## 2014-08-28 SURGERY — CARDIOVERSION
Anesthesia: General

## 2014-08-28 MED ORDER — LACTATED RINGERS IV SOLN
INTRAVENOUS | Status: DC
  Administered 2014-08-28: 12:00:00 via INTRAVENOUS

## 2014-08-28 MED ORDER — PROPOFOL 10 MG/ML IV BOLUS
INTRAVENOUS | Status: DC | PRN
  Administered 2014-08-28: 50 mg via INTRAVENOUS

## 2014-08-28 MED ORDER — LIDOCAINE HCL (CARDIAC) 20 MG/ML IV SOLN
INTRAVENOUS | Status: DC | PRN
  Administered 2014-08-28: 60 mg via INTRAVENOUS

## 2014-08-28 NOTE — H&P (Signed)
  Please see office visit notes for complete details of HPI.  

## 2014-08-28 NOTE — Anesthesia Postprocedure Evaluation (Signed)
  Anesthesia Post-op Note  Patient: Miguel Swanson  Procedure(s) Performed: Procedure(s): CARDIOVERSION (N/A)  Patient Location: Endoscopy Unit  Anesthesia Type:MAC  Level of Consciousness: awake, alert  and oriented  Airway and Oxygen Therapy: Patient Spontanous Breathing  Post-op Pain: none  Post-op Assessment: Post-op Vital signs reviewed, Patient's Cardiovascular Status Stable and Respiratory Function Stable  Post-op Vital Signs: Reviewed and stable  Last Vitals:  Filed Vitals:   08/28/14 1208  BP: 145/72  Pulse: 67  Temp:   Resp: 31    Complications: No apparent anesthesia complications

## 2014-08-28 NOTE — CV Procedure (Signed)
Direct current cardioversion:  Indication symptomatic A. Fibrillation.  Procedure: Using 50 mg of IV Propofol and 60 IV Lidocaine (for reducing venous pain) for achieving deep sedation, synchronized direct current cardioversion performed. Patient was delivered with 120 Joules of electricity X 1 with success to NSR. Patient tolerated the procedure well. No immediate complication noted.

## 2014-08-28 NOTE — Transfer of Care (Signed)
Immediate Anesthesia Transfer of Care Note  Patient: Miguel Swanson  Procedure(s) Performed: Procedure(s): CARDIOVERSION (N/A)  Patient Location: Endoscopy Unit  Anesthesia Type:MAC  Level of Consciousness: awake and alert   Airway & Oxygen Therapy: Patient Spontanous Breathing  Post-op Assessment: Report given to PACU RN  Post vital signs: Reviewed and stable  Complications: No apparent anesthesia complications

## 2014-08-28 NOTE — Interval H&P Note (Signed)
History and Physical Interval Note:  08/28/2014 11:49 AM  Miguel Swanson  has presented today for surgery, with the diagnosis of AFIB  The various methods of treatment have been discussed with the patient and family. After consideration of risks, benefits and other options for treatment, the patient has consented to  Procedure(s): CARDIOVERSION (N/A) as a surgical intervention .  The patient's history has been reviewed, patient examined, no change in status, stable for surgery.  I have reviewed the patient's chart and labs.  Questions were answered to the patient's satisfaction.     [name]

## 2014-08-28 NOTE — Anesthesia Preprocedure Evaluation (Addendum)
Anesthesia Evaluation  Patient identified by MRN, date of birth, ID band Patient awake    Reviewed: Allergy & Precautions, H&P , NPO status , Patient's Chart, lab work & pertinent test results  Airway Mallampati: III  TM Distance: >3 FB Neck ROM: Full    Dental  (+) Teeth Intact   Pulmonary neg pulmonary ROS,          Cardiovascular hypertension, + CAD, + CABG, + Peripheral Vascular Disease and +CHF + dysrhythmias Atrial Fibrillation  EF 40-45% per Dr. Einar Gip on recent echo in office.   Neuro/Psych negative neurological ROS  negative psych ROS   GI/Hepatic Neg liver ROS, GERD-  ,  Endo/Other  negative endocrine ROS  Renal/GU CRFRenal disease     Musculoskeletal  (+) Arthritis -,   Abdominal   Peds  Hematology negative hematology ROS (+)   Anesthesia Other Findings   Reproductive/Obstetrics                            Anesthesia Physical Anesthesia Plan  ASA: III  Anesthesia Plan: General   Post-op Pain Management:    Induction: Intravenous  Airway Management Planned: Mask  Additional Equipment:   Intra-op Plan:   Post-operative Plan:   Informed Consent: I have reviewed the patients History and Physical, chart, labs and discussed the procedure including the risks, benefits and alternatives for the proposed anesthesia with the patient or authorized representative who has indicated his/her understanding and acceptance.     Plan Discussed with: CRNA and Surgeon  Anesthesia Plan Comments:        Anesthesia Quick Evaluation

## 2014-08-29 ENCOUNTER — Encounter (HOSPITAL_COMMUNITY): Payer: Self-pay | Admitting: Cardiology

## 2014-09-06 ENCOUNTER — Ambulatory Visit (INDEPENDENT_AMBULATORY_CARE_PROVIDER_SITE_OTHER): Payer: Medicare Other | Admitting: Neurology

## 2014-09-06 ENCOUNTER — Encounter: Payer: Self-pay | Admitting: Neurology

## 2014-09-06 VITALS — BP 130/70 | HR 68 | Ht 65.9 in | Wt 202.8 lb

## 2014-09-06 DIAGNOSIS — R4189 Other symptoms and signs involving cognitive functions and awareness: Secondary | ICD-10-CM

## 2014-09-06 DIAGNOSIS — F4489 Other dissociative and conversion disorders: Secondary | ICD-10-CM

## 2014-09-06 MED ORDER — DONEPEZIL HCL 10 MG PO TABS: 10.0000 mg | ORAL_TABLET | Freq: Every day | ORAL | Status: DC

## 2014-09-06 NOTE — Progress Notes (Deleted)
Memory loss started last winter. He was living alone. When he came home from the store he couldn't get into his house. He slipped and hit his head. His neighbor didn't find him until the next day, his body temperature was in the 80.4. Before that he had an aortic aneurysm that was operated on. Before that was a stent leaking. Now lives with son. He does "strange things". He put a diaper over his pants. Discrete episode of confusion. Sometimes he blankly stares at him. No LOC. Unsure if he is not responsive or trying to think of things to say. He forgets appointments. He forgets people's names. Son has to remind him to change his clothes and shower. Can shower and dress and toilet alone. No driving, no cokking, no cleaning bills. He tries to put styrofam and metal in the microwave, has to keep an eye on him. No sleeping difficulties. Not walking as well, maybe he is scared. Another time they found him on the neighbor's. Still social, pleasant, lives to interact. Shuffles. He had therapy and they were trying to get him to pick up his feet. No falls but he gets up at night and walks up and down the hallway. No personality changes, delusions, hallucinations. Patient says he is not having memory problems. He has hearing aids but he doesn't wear them. Forgetfullness is getting worse. No FHx of seizures or dementia or neurodegenerative disorders. His back hurts. No tremor.    [facility]    Provider:  Dr Jaynee Eagles Referring Provider: Janith Lima, MD Primary Care Physician:  Scarlette Calico, MD  CC:  ***  HPI:  Miguel Swanson is a 80 y.o. male here as a referral from Dr. Ronnald Ramp for ***.  ***  Reviewed notes, labs and imaging from outside physicians, which showed ***  Review of Systems: Patient complains of symptoms per HPI as well as the following symptoms ***. Pertinent negatives per HPI. All others negative.   History   Social History  . Marital Status: Widowed    Spouse Name:  N/A    Number of Children: 3  . Years of Education: 12   Occupational History  . Retired/[employer]    Social History Main Topics  . Smoking status: Never Smoker   . Smokeless tobacco: Never Used  . Alcohol Use: No  . Drug Use: No  . Sexual Activity: Not Currently   Other Topics Concern  . 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 Prostate cancer Other   . Hypertension Other   . Cancer Father   . Heart disease Son     Past Medical History  Diagnosis Date  . Other diseases of lung, not elsewhere classified   . Malignant neoplasm of bladder, part unspecified   . Coronary atherosclerosis of unspecified type of vessel, native or graft     cabg  . Peripheral vascular disease, unspecified   . Unspecified essential hypertension   . Esophageal reflux   . AAA (abdominal aortic aneurysm) 12/2011, 9/20131    s/p endovasc graft repair [facility], repeat 06/2012  . Chronic low back  pain     ddd  . CAD (coronary artery disease)   . Hyperlipidemia   . Papillary carcinoma 2007  . Dysphagia     Past Surgical History  Procedure Laterality Date  . Coronary artery bypass graft    . Abdominal aortic aneurysm repair  12/2011, 07/04/12  . Inguinal hernia repair      Bilateral  . Transurethral resection of bladder  2007    resection of papilary tumor and placement of ureteral stent  . Coronary artery bypass graft  2004  . Abdominal aortic aneurysm repair  2005  . Inguinal hernia repair      bil  . Abdominal aortic endovascular stent graft  3/13, 9/13  . Esophagogastroduodenoscopy  11/03/2012    Procedure: ESOPHAGOGASTRODUODENOSCOPY (EGD);  Surgeon: Jerene Bears, MD;  Location: [place];  Service:  Gastroenterology;  Laterality: N/A;  the dlilation is a possibility  . Balloon dilation  11/03/2012    Procedure: BALLOON DILATION;  Surgeon: Jerene Bears, MD;  Location: [place];  Service: Gastroenterology;  Laterality: N/A;  . Cardioversion N/A 08/28/2014    Procedure: CARDIOVERSION;  Surgeon: [name], MD;  Location: [place];  Service: Cardiovascular;  Laterality: N/A;    Current Outpatient Prescriptions  Medication Sig Dispense Refill  . atorvastatin (LIPITOR) 40 MG tablet TAKE 1 TABLET BY MOUTH EVERY EVENING FOR HIGH CHOLESTEROL 90 tablet 1  . ELIQUIS 5 MG TABS tablet Take 5 mg by mouth 2 (two) times daily.     Marland Kitchen gabapentin (NEURONTIN) 300 MG capsule TAKE ONE CAPSULE BY MOUTH AT BEDTIME FOR NEUROPATHIC PAIN 90 capsule 11  . losartan (COZAAR) 50 MG tablet TAKE 1 TABLET BY MOUTH DAILY 90 tablet 3  . Melatonin 3 MG TABS TAKE 1 TABLET BY MOUTH AT BEDTIME FOR SLEEP 120 tablet 0  . Multiple Vitamin (MULTIVITAMIN WITH MINERALS) TABS Take 1 tablet by mouth daily.    . polyethylene glycol powder (GLYCOLAX/MIRALAX) powder USE 1 CAPFUL MIXED IN 8 OUNCES OF LIQUID DAILY FOR CONSTIPATION 527 g 11  . ranitidine (ZANTAC) 150 MG tablet Take 1 tablet (150 mg total) by mouth 2 (two) times daily. 60 tablet 11  . rOPINIRole (REQUIP) 0.25 MG tablet TAKE 1 TABLET BY MOUTH 2 HOURS BEFORE BEDTIME 90 tablet 3  . traZODone (DESYREL) 50 MG tablet TAKE 1-2 TABLETS BY MOUTH EVERY EVENING FOR REST 60 tablet 11   No current facility-administered medications for this visit.    Allergies as of 09/06/2014 - Review Complete 09/06/2014  Allergen Reaction Noted  . Lisinopril  02/22/2013    Vitals: BP 130/70 mmHg  Pulse 68  Ht 5' 5.9" (1.674 m)  Wt 202 lb 12.8 oz (91.989 kg)  BMI 32.83 kg/m2 Last Weight:  Wt Readings from Last 1 Encounters:  09/06/14 202 lb 12.8 oz (91.989 kg)   Last Height:   Ht Readings from Last 1 Encounters:  09/06/14 5' 5.9" (1.674 m)    Physical exam: Exam: Gen:  NAD, not conversant, pleasant                  CV: RRR. No peripheral edema, warm, nontender Eyes: Conjunctivae clear without exudates or hemorrhage  Neuro: Detailed Neurologic Exam  Speech:   Not aphasic.  Cognition:    The patient is oriented to person, place, and time;     recent and remote memory intact;     language fluent;     normal attention, concentration,     fund of knowledge  Cranial Nerves:    The pupils are equal, round, and reactive to light.Can't visualize fundi.blinks to threat. he face is symmetric. The palate elevates in the midline. Voice is normal. Shoulder shrug is normal. The tongue has normal motion without fasciculations.   Coordination:    Bradycardic with finger tapping and open hand however had severe frostbite and loss of sensation. No dysmetria noted.   Gait:    Wide based, shuffling  Motor Observation:    Postural tremor right > left Tone:    Mild increased tone on the right arm  Posture:    Mildly stooped    Strength:    Strength is V/V in the upper and lower limbs.      Sensation:     Intact to LT  Reflex Exam:  DTR's:    Deep tendon reflexes in the upper and lower extremities are normal bilaterally.   Toes:    The toes are upgoing bilaterally.   Clonus:    Clonus is absent.      Assessment/Plan:    Seizures  MRi of the brain Tsh and B12 normal Parkinsonian  Aricept  Sarina Ill, MD  [facility] [address] [place], [place] [address]  Phone [phone] Fax [phone]

## 2014-09-06 NOTE — Patient Instructions (Signed)
Overall you are doing fairly well but I do want to suggest a few things today:   Remember to drink plenty of fluid, eat healthy meals and do not skip any meals. Try to eat protein with a every meal and eat a healthy snack such as fruit or nuts in between meals. Try to keep a regular sleep-wake schedule and try to exercise daily, particularly in the form of walking, 20-30 minutes a day, if you can.   As far as your medications are concerned, I would like to suggest: Aricept. Start with 1/2 tab then can increase to a full tablet after 2 weeks.   As far as diagnostic testing: EEG (please schedule on your way out) and MRI of the brain (we will call you) I would like to see you back in 4 months, sooner if we need to. Please call us with any interim questions, concerns, problems, updates or refill requests.   Please also call us for any test results so we can go over those with you on the phone.  My clinical assistant and will answer any of your questions and relay your messages to me and also relay most of my messages to you.   Our phone number is [phone]. We also have an after hours call service for urgent matters and there is a physician on-call for urgent questions. For any emergencies you know to call 911 or go to the nearest emergency room

## 2014-09-09 ENCOUNTER — Encounter: Payer: Self-pay | Admitting: Neurology

## 2014-09-09 NOTE — Progress Notes (Addendum)
[facility]    Provider:  Dr Jaynee Eagles Referring Provider: Janith Lima, MD Primary Care Physician:  Scarlette Calico, MD  CC:  Memory loss and confusional episodes  HPI:  Miguel Swanson is a 80 y.o. male here as a referral from Dr. Ronnald Ramp for Memory loss and confusional episodes. He is accompanied by son and son provides all the information. Patient did not wear his hearing aids and cannot hear. Patient has a PMHx of afib, CAD and ischemic cardiac myopathy, on anticoagulation , HTN, RLS, CKD stage 3, HLD.  Memory loss started last winter. He was living alone. When he came home from the store he couldn't get into his house. He slipped and hit his head. His neighbor didn't find him until the next day, his body temperature was  80.4. Now lives with son. He does "strange things". He put a diaper over his pants. Discrete episode of confusion. Sometimes he blankly stares at son. No LOC. Unsure if he is not responsive or trying to think of things to say. He forgets appointments. He forgets people's names. Son has to remind him to change his clothes and shower. Can shower and dress and toilet alone. No driving, no cooking, no cleaning, son pays bills. He tries to put styrofam and metal in the microwave, son has to keep an eye on him. No sleeping difficulties. Another time they found him on the neighbor's porch just sitting there. Still social, pleasant, likes to interact. Shuffles. He had therapy and they were trying to get him to pick up his feet. No falls but he gets up at night and walks up and down the hallway. No personality changes, delusions, hallucinations. Patient says he is not having memory problems. He has hearing aids but he doesn't wear them. Forgetfulness is getting worse. No FHx of seizures or dementia or neurodegenerative disorders. His back hurts. No tremor.  Reviewed notes, labs and imaging from outside physicians, which showed: TSH WNL, HgbA1c 5.9, GFR 61, CBC with  anemia. Personally reviewed CT exam of the brain which showed severe atrophy and white matter non-specific disease.   Review of Systems: Patient complains of symptoms per HPI as well as the following symptoms: Hearing loss, sleepiness, Denies CP/SOB/Syncope. Pertinent negatives per HPI. All others negative.   History   Social History  . Marital Status: Widowed    Spouse Name: N/A    Number of Children: 3  . Years of Education: 12   Occupational History  . Retired/[employer]    Social History Main Topics  . Smoking status: Never Smoker   . Smokeless tobacco: Never Used  . Alcohol Use: No  . Drug Use: No  . Sexual Activity: Not Currently   Other Topics Concern  . 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 Prostate cancer Other   . Hypertension Other   . Cancer Father   . Heart disease Son     Past Medical History  Diagnosis Date  . Other diseases of lung, not elsewhere classified   . Malignant neoplasm of bladder, part unspecified   .  Coronary atherosclerosis of unspecified type of vessel, native or graft     cabg  . Peripheral vascular disease, unspecified   . Unspecified essential hypertension   . Esophageal reflux   . AAA (abdominal aortic aneurysm) 12/2011, 9/20131    s/p endovasc graft repair [facility], repeat 06/2012  . Chronic low back pain     ddd  . CAD (coronary artery disease)   . Hyperlipidemia   . Papillary carcinoma 2007  . Dysphagia     Past Surgical History  Procedure Laterality Date  . Coronary artery bypass graft    . Abdominal aortic aneurysm repair  12/2011, 07/04/12  . Inguinal hernia repair      Bilateral  . Transurethral resection of bladder  2007    resection of  papilary tumor and placement of ureteral stent  . Coronary artery bypass graft  2004  . Abdominal aortic aneurysm repair  2005  . Inguinal hernia repair      bil  . Abdominal aortic endovascular stent graft  3/13, 9/13  . Esophagogastroduodenoscopy  11/03/2012    Procedure: ESOPHAGOGASTRODUODENOSCOPY (EGD);  Surgeon: Jerene Bears, MD;  Location: [place];  Service: Gastroenterology;  Laterality: N/A;  the dlilation is a possibility  . Balloon dilation  11/03/2012    Procedure: BALLOON DILATION;  Surgeon: Jerene Bears, MD;  Location: [place];  Service: Gastroenterology;  Laterality: N/A;  . Cardioversion N/A 08/28/2014    Procedure: CARDIOVERSION;  Surgeon: [name], MD;  Location: [place];  Service: Cardiovascular;  Laterality: N/A;    Current Outpatient Prescriptions  Medication Sig Dispense Refill  . atorvastatin (LIPITOR) 40 MG tablet TAKE 1 TABLET BY MOUTH EVERY EVENING FOR HIGH CHOLESTEROL 90 tablet 1  . ELIQUIS 5 MG TABS tablet Take 5 mg by mouth 2 (two) times daily.     Marland Kitchen gabapentin (NEURONTIN) 300 MG capsule TAKE ONE CAPSULE BY MOUTH AT BEDTIME FOR NEUROPATHIC PAIN 90 capsule 11  . losartan (COZAAR) 50 MG tablet TAKE 1 TABLET BY MOUTH DAILY 90 tablet 3  . Melatonin 3 MG TABS TAKE 1 TABLET BY MOUTH AT BEDTIME FOR SLEEP 120 tablet 0  . Multiple Vitamin (MULTIVITAMIN WITH MINERALS) TABS Take 1 tablet by mouth daily.    . polyethylene glycol powder (GLYCOLAX/MIRALAX) powder USE 1 CAPFUL MIXED IN 8 OUNCES OF LIQUID DAILY FOR CONSTIPATION 527 g 11  . ranitidine (ZANTAC) 150 MG tablet Take 1 tablet (150 mg total) by mouth 2 (two) times daily. 60 tablet 11  . rOPINIRole (REQUIP) 0.25 MG tablet TAKE 1 TABLET BY MOUTH 2 HOURS BEFORE BEDTIME 90 tablet 3  . traZODone (DESYREL) 50 MG tablet TAKE 1-2 TABLETS BY MOUTH EVERY EVENING FOR REST 60 tablet 11   No current facility-administered medications for this visit.    Allergies as of 09/06/2014 - Review Complete 09/06/2014   Allergen Reaction Noted  . Lisinopril  02/22/2013    Vitals: BP 130/70 mmHg  Pulse 68  Ht 5' 5.9" (1.674 m)  Wt 202 lb 12.8 oz (91.989 kg)  BMI 32.83 kg/m2 Last Weight:  Wt Readings from Last 1 Encounters:  09/06/14 202 lb 12.8 oz (91.989 kg)   Last Height:   Ht Readings from Last 1 Encounters:  09/06/14 5' 5.9" (1.674 m)    Physical exam: Exam: Gen: NAD, not conversant, pleasant                  CV: RRR. No peripheral edema, warm, nontender Eyes: Conjunctivae clear without exudates  or hemorrhage  Neuro: Detailed Neurologic Exam  Speech:   Not aphasic.  Cognition:    The patient is oriented to person, place, and time;     Impaired recent and impaired remote memory intact;     fund of knowledge impaired    Difficulty doing MoCA or MMSE as patient is not wearing his hearing aids, MMSE 18/30: -5 orientation, -5 calculation and attention, -1 recall, -1 language,  Cranial Nerves:    The pupils are equal, round, and reactive to light.Can't visualize fundi.blinks to threat. face is symmetric. The palate elevates in the midline. Voice is normal. Shoulder shrug is normal. The tongue has normal motion without fasciculations. Hearing loss.  Coordination:    Bradycardic with finger tapping and open hand however had severe frostbite and loss of sensation. No dysmetria noted.   Gait:    Wide based, shuffling  Motor Observation:    Postural tremor right > left Tone:    Mild increased tone on the right arm  Posture:    Mildly stooped    Strength:    Strength is V/V in the upper and lower limbs.      Sensation:     Intact to LT  Reflex Exam:  DTR's:    Deep tendon reflexes in the upper and lower extremities are normal bilaterally.   Toes:    The toes are upgoing bilaterally.   Clonus:    Clonus is absent.   Assessment/Plan:  80 year old male with PMHx of afib, CAD and ischemic cardiac myopathy, on anticoagulation , HTN, RLS, CKD stage 3, HLD. He has cognitive  impairment but recently is having episodes of confusion with strange behavior and staring episodes. Will evaluate for seizures with MRI of the brain and EEG. TSH and B12 normal. Will start Aricept for progressive cognitive decline. Has some Parkinsonism on exam, will follow clinically.  Sarina Ill, MD  [facility] [address] [place], [place] [address]  Phone [phone] Fax [phone] Lenor Coffin

## 2014-09-12 ENCOUNTER — Ambulatory Visit (INDEPENDENT_AMBULATORY_CARE_PROVIDER_SITE_OTHER): Payer: Medicare Other | Admitting: Neurology

## 2014-09-12 DIAGNOSIS — F4489 Other dissociative and conversion disorders: Secondary | ICD-10-CM

## 2014-09-12 DIAGNOSIS — R4189 Other symptoms and signs involving cognitive functions and awareness: Secondary | ICD-10-CM

## 2014-09-12 NOTE — Procedures (Signed)
      Miguel Swanson is an 80 year old gentleman with a history of memory problems and episodes of confusion. The patient is having unusual behavior events. The patient being evaluated for this change in mental status.  This is a routine EEG. No skull defects are noted. Medications include Lipitor, Aricept, Eliquis, gabapentin, losartan, melatonin, multivitamins, MiraLAX, Zantac, Requip, and trazodone.  EEG classification: Dysrhythmia grade 1 generalized  Description of the recording: The background rhythms of this recording consists of a relatively well-modulated medium amplitude alpha rhythm of 8 Hz that is reactive to eye opening closure. As the record progresses, intermittent 4-5 Hz theta frequency slowing is seen intermittently, and is generalized in nature. Photic simulation is performed, and this results in a minimal but bilateral photic response. Hyperventilation was not performed. At no time during the recording does there appear to be evidence of spike or spike wave discharges or evidence of focal slowing. EKG monitor shows no evidence of cardiac rhythm abnormalities with a heart rate of 66.  Impression: This is an abnormal EEG recording secondary to mild diffuse intermittent theta deficiency slowing. This is a nonspecific recording, and may be seen with any process that results in a mild toxic or metabolic encephalopathy or any dementing type illness. No epileptiform discharges were seen.

## 2014-09-17 ENCOUNTER — Telehealth: Payer: Self-pay | Admitting: Neurology

## 2014-09-17 NOTE — Telephone Encounter (Signed)
Spoke to patient's son. EEG showed mild diffuse slowing,  Which is non-specific and can be consistent with a dementing process..  No epileptiform discharges were seen. I really don't think an MRi of the brain is necessary, we have several CT scans showing diffuse generalized atrophy and chronic WM changes, don't think an MRI of the brain would add much but will discuss with son at follow up.

## 2014-10-16 DIAGNOSIS — I4891 Unspecified atrial fibrillation: Secondary | ICD-10-CM | POA: Diagnosis not present

## 2014-10-16 DIAGNOSIS — Z9889 Other specified postprocedural states: Secondary | ICD-10-CM | POA: Diagnosis not present

## 2014-10-16 DIAGNOSIS — I10 Essential (primary) hypertension: Secondary | ICD-10-CM | POA: Diagnosis not present

## 2014-10-16 DIAGNOSIS — Z7901 Long term (current) use of anticoagulants: Secondary | ICD-10-CM | POA: Diagnosis not present

## 2014-10-31 ENCOUNTER — Other Ambulatory Visit: Payer: Self-pay | Admitting: Internal Medicine

## 2014-11-07 DIAGNOSIS — C678 Malignant neoplasm of overlapping sites of bladder: Secondary | ICD-10-CM | POA: Diagnosis not present

## 2014-11-07 DIAGNOSIS — Z8551 Personal history of malignant neoplasm of bladder: Secondary | ICD-10-CM | POA: Diagnosis not present

## 2014-11-08 DIAGNOSIS — L84 Corns and callosities: Secondary | ICD-10-CM | POA: Diagnosis not present

## 2014-11-08 DIAGNOSIS — L602 Onychogryphosis: Secondary | ICD-10-CM | POA: Diagnosis not present

## 2015-01-19 ENCOUNTER — Other Ambulatory Visit: Payer: Self-pay

## 2015-01-19 MED ORDER — DONEPEZIL HCL 10 MG PO TABS: 10.0000 mg | ORAL_TABLET | Freq: Every day | ORAL | Status: DC

## 2015-02-22 ENCOUNTER — Other Ambulatory Visit: Payer: Self-pay | Admitting: Internal Medicine

## 2015-02-27 DIAGNOSIS — C678 Malignant neoplasm of overlapping sites of bladder: Secondary | ICD-10-CM | POA: Diagnosis not present

## 2015-03-15 ENCOUNTER — Other Ambulatory Visit: Payer: Self-pay | Admitting: Internal Medicine

## 2015-03-18 ENCOUNTER — Other Ambulatory Visit: Payer: Self-pay | Admitting: Neurology

## 2015-03-18 NOTE — Telephone Encounter (Signed)
Pharmacy indicated patient would like 90 day Rx

## 2015-03-24 ENCOUNTER — Other Ambulatory Visit: Payer: Self-pay | Admitting: Internal Medicine

## 2015-04-17 DIAGNOSIS — I480 Paroxysmal atrial fibrillation: Secondary | ICD-10-CM | POA: Diagnosis not present

## 2015-04-17 DIAGNOSIS — I10 Essential (primary) hypertension: Secondary | ICD-10-CM | POA: Diagnosis not present

## 2015-04-17 DIAGNOSIS — I2510 Atherosclerotic heart disease of native coronary artery without angina pectoris: Secondary | ICD-10-CM | POA: Diagnosis not present

## 2015-04-17 DIAGNOSIS — Z7901 Long term (current) use of anticoagulants: Secondary | ICD-10-CM | POA: Diagnosis not present

## 2015-04-17 DIAGNOSIS — E780 Pure hypercholesterolemia: Secondary | ICD-10-CM | POA: Diagnosis not present

## 2015-04-21 ENCOUNTER — Other Ambulatory Visit: Payer: Self-pay | Admitting: Internal Medicine

## 2015-04-24 LAB — CHG LIPOPROTEIN BLOOD QUAN NUMBERS & SUBCLASSES
CHOLESTEROL, TOTAL: 148
HDL: 26
LDL: 44
Triglycerides: 389

## 2015-05-09 ENCOUNTER — Other Ambulatory Visit: Payer: Self-pay | Admitting: Internal Medicine

## 2015-05-16 ENCOUNTER — Other Ambulatory Visit: Payer: Self-pay | Admitting: Neurology

## 2015-07-30 ENCOUNTER — Other Ambulatory Visit: Payer: Self-pay | Admitting: Internal Medicine

## 2015-08-13 ENCOUNTER — Other Ambulatory Visit: Payer: Self-pay | Admitting: Neurology

## 2015-08-27 ENCOUNTER — Other Ambulatory Visit: Payer: Self-pay | Admitting: Internal Medicine

## 2015-08-30 ENCOUNTER — Other Ambulatory Visit: Payer: Self-pay | Admitting: Neurology

## 2015-09-12 ENCOUNTER — Other Ambulatory Visit: Payer: Self-pay | Admitting: Internal Medicine

## 2015-10-01 ENCOUNTER — Other Ambulatory Visit: Payer: Self-pay | Admitting: Neurology

## 2015-10-22 ENCOUNTER — Other Ambulatory Visit: Payer: Self-pay | Admitting: Neurology

## 2015-10-23 ENCOUNTER — Other Ambulatory Visit: Payer: Self-pay

## 2015-10-26 ENCOUNTER — Other Ambulatory Visit: Payer: Self-pay | Admitting: Internal Medicine

## 2015-10-29 ENCOUNTER — Telehealth: Payer: Self-pay | Admitting: *Deleted

## 2015-10-29 NOTE — Telephone Encounter (Signed)
Faxed refill request to [facility] for donepezil 10mg . Received confirmation.

## 2015-11-18 ENCOUNTER — Other Ambulatory Visit: Payer: Self-pay | Admitting: Internal Medicine

## 2015-11-19 NOTE — Telephone Encounter (Signed)
Last OV oct/2015--labs nov/2015, are you ok with refilling or does patient need office visit?--please advise, thanks

## 2015-11-19 NOTE — Telephone Encounter (Signed)
Called patients number, son answered and states patient has dementia and he is now helping patient with everything---son is not able to bring patient for appt right away, not sure when he can, son advised to call back to schedule appt if he would like this med continued--refill has been refused for now for statin med per dr Ronnald Ramp note

## 2016-01-25 ENCOUNTER — Other Ambulatory Visit: Payer: Self-pay | Admitting: Neurology

## 2016-04-16 DIAGNOSIS — I2510 Atherosclerotic heart disease of native coronary artery without angina pectoris: Secondary | ICD-10-CM | POA: Diagnosis not present

## 2016-04-16 DIAGNOSIS — I480 Paroxysmal atrial fibrillation: Secondary | ICD-10-CM | POA: Diagnosis not present

## 2016-04-16 DIAGNOSIS — Z7901 Long term (current) use of anticoagulants: Secondary | ICD-10-CM | POA: Diagnosis not present

## 2016-04-16 DIAGNOSIS — I10 Essential (primary) hypertension: Secondary | ICD-10-CM | POA: Diagnosis not present

## 2016-04-26 ENCOUNTER — Other Ambulatory Visit: Payer: Self-pay | Admitting: Internal Medicine

## 2016-07-31 ENCOUNTER — Other Ambulatory Visit: Payer: Self-pay | Admitting: Internal Medicine

## 2016-08-30 ENCOUNTER — Other Ambulatory Visit: Payer: Self-pay | Admitting: Internal Medicine

## 2016-10-27 ENCOUNTER — Emergency Department (HOSPITAL_COMMUNITY): Payer: Medicare Other

## 2016-10-27 ENCOUNTER — Encounter (HOSPITAL_COMMUNITY): Admission: EM | Disposition: E | Payer: Self-pay | Source: Home / Self Care | Attending: Surgery

## 2016-10-27 ENCOUNTER — Emergency Department (HOSPITAL_COMMUNITY): Payer: Medicare Other | Admitting: Anesthesiology

## 2016-10-27 ENCOUNTER — Inpatient Hospital Stay (HOSPITAL_COMMUNITY)
Admission: EM | Admit: 2016-10-27 | Discharge: 2016-12-03 | DRG: 270 | Disposition: E | Payer: Medicare Other | Attending: Surgery | Admitting: Surgery

## 2016-10-27 ENCOUNTER — Inpatient Hospital Stay (HOSPITAL_COMMUNITY): Payer: Medicare Other

## 2016-10-27 ENCOUNTER — Encounter (HOSPITAL_COMMUNITY): Payer: Self-pay | Admitting: Emergency Medicine

## 2016-10-27 DIAGNOSIS — R579 Shock, unspecified: Secondary | ICD-10-CM | POA: Diagnosis not present

## 2016-10-27 DIAGNOSIS — E860 Dehydration: Secondary | ICD-10-CM | POA: Diagnosis present

## 2016-10-27 DIAGNOSIS — I713 Abdominal aortic aneurysm, ruptured: Secondary | ICD-10-CM | POA: Diagnosis present

## 2016-10-27 DIAGNOSIS — I718 Aortic aneurysm of unspecified site, ruptured: Secondary | ICD-10-CM | POA: Diagnosis present

## 2016-10-27 DIAGNOSIS — J9601 Acute respiratory failure with hypoxia: Secondary | ICD-10-CM

## 2016-10-27 DIAGNOSIS — R001 Bradycardia, unspecified: Secondary | ICD-10-CM | POA: Diagnosis present

## 2016-10-27 DIAGNOSIS — Y832 Surgical operation with anastomosis, bypass or graft as the cause of abnormal reaction of the patient, or of later complication, without mention of misadventure at the time of the procedure: Secondary | ICD-10-CM | POA: Diagnosis present

## 2016-10-27 DIAGNOSIS — Z9289 Personal history of other medical treatment: Secondary | ICD-10-CM

## 2016-10-27 DIAGNOSIS — E875 Hyperkalemia: Secondary | ICD-10-CM | POA: Diagnosis not present

## 2016-10-27 DIAGNOSIS — Z8679 Personal history of other diseases of the circulatory system: Secondary | ICD-10-CM

## 2016-10-27 DIAGNOSIS — R109 Unspecified abdominal pain: Secondary | ICD-10-CM | POA: Diagnosis present

## 2016-10-27 DIAGNOSIS — H9190 Unspecified hearing loss, unspecified ear: Secondary | ICD-10-CM | POA: Diagnosis present

## 2016-10-27 DIAGNOSIS — I2510 Atherosclerotic heart disease of native coronary artery without angina pectoris: Secondary | ICD-10-CM | POA: Diagnosis present

## 2016-10-27 DIAGNOSIS — N280 Ischemia and infarction of kidney: Secondary | ICD-10-CM | POA: Diagnosis present

## 2016-10-27 DIAGNOSIS — G629 Polyneuropathy, unspecified: Secondary | ICD-10-CM | POA: Diagnosis present

## 2016-10-27 DIAGNOSIS — Z978 Presence of other specified devices: Secondary | ICD-10-CM

## 2016-10-27 DIAGNOSIS — Z8551 Personal history of malignant neoplasm of bladder: Secondary | ICD-10-CM | POA: Diagnosis not present

## 2016-10-27 DIAGNOSIS — Z515 Encounter for palliative care: Secondary | ICD-10-CM | POA: Diagnosis not present

## 2016-10-27 DIAGNOSIS — Z66 Do not resuscitate: Secondary | ICD-10-CM | POA: Diagnosis present

## 2016-10-27 DIAGNOSIS — R571 Hypovolemic shock: Secondary | ICD-10-CM | POA: Diagnosis present

## 2016-10-27 DIAGNOSIS — N179 Acute kidney failure, unspecified: Secondary | ICD-10-CM | POA: Diagnosis present

## 2016-10-27 DIAGNOSIS — T426X5A Adverse effect of other antiepileptic and sedative-hypnotic drugs, initial encounter: Secondary | ICD-10-CM | POA: Diagnosis not present

## 2016-10-27 DIAGNOSIS — I715 Thoracoabdominal aortic aneurysm, ruptured: Secondary | ICD-10-CM | POA: Diagnosis not present

## 2016-10-27 DIAGNOSIS — G9341 Metabolic encephalopathy: Secondary | ICD-10-CM | POA: Diagnosis present

## 2016-10-27 DIAGNOSIS — K219 Gastro-esophageal reflux disease without esophagitis: Secondary | ICD-10-CM | POA: Diagnosis present

## 2016-10-27 DIAGNOSIS — F0390 Unspecified dementia without behavioral disturbance: Secondary | ICD-10-CM | POA: Diagnosis present

## 2016-10-27 DIAGNOSIS — I4891 Unspecified atrial fibrillation: Secondary | ICD-10-CM | POA: Diagnosis present

## 2016-10-27 DIAGNOSIS — E785 Hyperlipidemia, unspecified: Secondary | ICD-10-CM | POA: Diagnosis present

## 2016-10-27 DIAGNOSIS — T82398A Other mechanical complication of other vascular grafts, initial encounter: Principal | ICD-10-CM | POA: Diagnosis present

## 2016-10-27 DIAGNOSIS — R58 Hemorrhage, not elsewhere classified: Secondary | ICD-10-CM | POA: Diagnosis present

## 2016-10-27 DIAGNOSIS — D62 Acute posthemorrhagic anemia: Secondary | ICD-10-CM | POA: Diagnosis present

## 2016-10-27 DIAGNOSIS — Z8249 Family history of ischemic heart disease and other diseases of the circulatory system: Secondary | ICD-10-CM

## 2016-10-27 DIAGNOSIS — I509 Heart failure, unspecified: Secondary | ICD-10-CM

## 2016-10-27 DIAGNOSIS — R1310 Dysphagia, unspecified: Secondary | ICD-10-CM | POA: Diagnosis present

## 2016-10-27 DIAGNOSIS — Z4659 Encounter for fitting and adjustment of other gastrointestinal appliance and device: Secondary | ICD-10-CM

## 2016-10-27 DIAGNOSIS — R52 Pain, unspecified: Secondary | ICD-10-CM

## 2016-10-27 DIAGNOSIS — J9690 Respiratory failure, unspecified, unspecified whether with hypoxia or hypercapnia: Secondary | ICD-10-CM

## 2016-10-27 DIAGNOSIS — Z7901 Long term (current) use of anticoagulants: Secondary | ICD-10-CM

## 2016-10-27 DIAGNOSIS — Z9889 Other specified postprocedural states: Secondary | ICD-10-CM | POA: Diagnosis not present

## 2016-10-27 DIAGNOSIS — G9340 Encephalopathy, unspecified: Secondary | ICD-10-CM | POA: Diagnosis not present

## 2016-10-27 DIAGNOSIS — I10 Essential (primary) hypertension: Secondary | ICD-10-CM | POA: Diagnosis present

## 2016-10-27 DIAGNOSIS — Z951 Presence of aortocoronary bypass graft: Secondary | ICD-10-CM

## 2016-10-27 DIAGNOSIS — Z79899 Other long term (current) drug therapy: Secondary | ICD-10-CM

## 2016-10-27 HISTORY — PX: ABDOMINAL AORTIC ANEURYSM REPAIR: SHX42

## 2016-10-27 LAB — CBC WITH DIFFERENTIAL/PLATELET
Basophils Absolute: 0 10*3/uL (ref 0.0–0.1)
Basophils Relative: 0 %
Eosinophils Absolute: 0.1 10*3/uL (ref 0.0–0.7)
Eosinophils Relative: 1 %
HEMATOCRIT: 27 % — AB (ref 39.0–52.0)
HEMOGLOBIN: 9.2 g/dL — AB (ref 13.0–17.0)
LYMPHS ABS: 0.9 10*3/uL (ref 0.7–4.0)
LYMPHS PCT: 9 %
MCH: 32.6 pg (ref 26.0–34.0)
MCHC: 34.1 g/dL (ref 30.0–36.0)
MCV: 95.7 fL (ref 78.0–100.0)
Monocytes Absolute: 0.6 10*3/uL (ref 0.1–1.0)
Monocytes Relative: 7 %
NEUTROS ABS: 7.8 10*3/uL — AB (ref 1.7–7.7)
NEUTROS PCT: 83 %
Platelets: 219 10*3/uL (ref 150–400)
RBC: 2.82 MIL/uL — AB (ref 4.22–5.81)
RDW: 13.9 % (ref 11.5–15.5)
WBC: 9.4 10*3/uL (ref 4.0–10.5)

## 2016-10-27 LAB — POCT I-STAT 7, (LYTES, BLD GAS, ICA,H+H)
ACID-BASE DEFICIT: 3 mmol/L — AB (ref 0.0–2.0)
Bicarbonate: 21.9 mmol/L (ref 20.0–28.0)
CALCIUM ION: 1.05 mmol/L — AB (ref 1.15–1.40)
HEMATOCRIT: 31 % — AB (ref 39.0–52.0)
HEMOGLOBIN: 10.5 g/dL — AB (ref 13.0–17.0)
O2 Saturation: 97 %
PH ART: 7.388 (ref 7.350–7.450)
POTASSIUM: 5.2 mmol/L — AB (ref 3.5–5.1)
SODIUM: 139 mmol/L (ref 135–145)
TCO2: 23 mmol/L (ref 0–100)
pCO2 arterial: 36 mmHg (ref 32.0–48.0)
pO2, Arterial: 84 mmHg (ref 83.0–108.0)

## 2016-10-27 LAB — CBC
HCT: 18.8 % — ABNORMAL LOW (ref 39.0–52.0)
Hemoglobin: 6.3 g/dL — CL (ref 13.0–17.0)
MCH: 32.3 pg (ref 26.0–34.0)
MCHC: 33.5 g/dL (ref 30.0–36.0)
MCV: 96.4 fL (ref 78.0–100.0)
Platelets: 231 10*3/uL (ref 150–400)
RBC: 1.95 MIL/uL — ABNORMAL LOW (ref 4.22–5.81)
RDW: 14.1 % (ref 11.5–15.5)
WBC: 10.9 10*3/uL — ABNORMAL HIGH (ref 4.0–10.5)

## 2016-10-27 LAB — PREPARE RBC (CROSSMATCH)

## 2016-10-27 LAB — COMPREHENSIVE METABOLIC PANEL
ALK PHOS: 88 U/L (ref 38–126)
ALT: 14 U/L — AB (ref 17–63)
AST: 18 U/L (ref 15–41)
Albumin: 3.7 g/dL (ref 3.5–5.0)
Anion gap: 10 (ref 5–15)
BUN: 35 mg/dL — ABNORMAL HIGH (ref 6–20)
CALCIUM: 8.8 mg/dL — AB (ref 8.9–10.3)
CO2: 23 mmol/L (ref 22–32)
CREATININE: 1.29 mg/dL — AB (ref 0.61–1.24)
Chloride: 105 mmol/L (ref 101–111)
GFR, EST AFRICAN AMERICAN: 55 mL/min — AB (ref >60)
GFR, EST NON AFRICAN AMERICAN: 48 mL/min — AB (ref >60)
Glucose, Bld: 103 mg/dL — ABNORMAL HIGH (ref 65–99)
Potassium: 4.3 mmol/L (ref 3.5–5.1)
SODIUM: 138 mmol/L (ref 135–145)
Total Bilirubin: 2 mg/dL — ABNORMAL HIGH (ref 0.3–1.2)
Total Protein: 7.2 g/dL (ref 6.5–8.1)

## 2016-10-27 LAB — POCT I-STAT 3, ART BLOOD GAS (G3+)
Acid-base deficit: 4 mmol/L — ABNORMAL HIGH (ref 0.0–2.0)
BICARBONATE: 21 mmol/L (ref 20.0–28.0)
O2 Saturation: 100 %
PH ART: 7.344 — AB (ref 7.350–7.450)
TCO2: 22 mmol/L (ref 0–100)
pCO2 arterial: 38.3 mmHg (ref 32.0–48.0)
pO2, Arterial: 256 mmHg — ABNORMAL HIGH (ref 83.0–108.0)

## 2016-10-27 LAB — BASIC METABOLIC PANEL
Anion gap: 11 (ref 5–15)
BUN: 34 mg/dL — AB (ref 6–20)
CALCIUM: 8.3 mg/dL — AB (ref 8.9–10.3)
CO2: 21 mmol/L — AB (ref 22–32)
CREATININE: 1.61 mg/dL — AB (ref 0.61–1.24)
Chloride: 107 mmol/L (ref 101–111)
GFR calc non Af Amer: 37 mL/min — ABNORMAL LOW (ref >60)
GFR, EST AFRICAN AMERICAN: 42 mL/min — AB (ref >60)
GLUCOSE: 142 mg/dL — AB (ref 65–99)
Potassium: 4.3 mmol/L (ref 3.5–5.1)
Sodium: 139 mmol/L (ref 135–145)

## 2016-10-27 LAB — APTT: aPTT: 37 seconds — ABNORMAL HIGH (ref 24–36)

## 2016-10-27 LAB — MAGNESIUM: Magnesium: 2.1 mg/dL (ref 1.7–2.4)

## 2016-10-27 LAB — PROTIME-INR
INR: 1.77
INR: 1.97
Prothrombin Time: 20.8 seconds — ABNORMAL HIGH (ref 11.4–15.2)
Prothrombin Time: 22.7 seconds — ABNORMAL HIGH (ref 11.4–15.2)

## 2016-10-27 LAB — ABO/RH: ABO/RH(D): O POS

## 2016-10-27 LAB — SURGICAL PCR SCREEN
MRSA, PCR: NEGATIVE
STAPHYLOCOCCUS AUREUS: POSITIVE — AB

## 2016-10-27 LAB — TROPONIN I: TROPONIN I: 0.06 ng/mL — AB (ref <0.03)

## 2016-10-27 SURGERY — EMBOLIZATION
Anesthesia: General | Site: Renal | Laterality: Right

## 2016-10-27 MED ORDER — FENTANYL CITRATE (PF) 100 MCG/2ML IJ SOLN
50.0000 ug | Freq: Once | INTRAMUSCULAR | Status: AC
  Administered 2016-10-27: 50 ug via INTRAVENOUS
  Filled 2016-10-27: qty 2

## 2016-10-27 MED ORDER — CEFAZOLIN SODIUM-DEXTROSE 2-3 GM-% IV SOLR
2.0000 g | Freq: Once | INTRAVENOUS | Status: DC
  Filled 2016-10-27: qty 50

## 2016-10-27 MED ORDER — MORPHINE SULFATE (PF) 2 MG/ML IV SOLN: 2.0000 mg | INTRAVENOUS | Status: DC | PRN

## 2016-10-27 MED ORDER — LABETALOL HCL 5 MG/ML IV SOLN: 10.0000 mg | INTRAVENOUS | Status: DC | PRN

## 2016-10-27 MED ORDER — IOPAMIDOL (ISOVUE-370) INJECTION 76%
INTRAVENOUS | Status: AC
  Administered 2016-10-27: 100 mL
  Filled 2016-10-27: qty 100

## 2016-10-27 MED ORDER — SODIUM CHLORIDE 0.9 % IV SOLN
500.0000 mL | Freq: Once | INTRAVENOUS | Status: DC | PRN
Start: 2016-10-27 — End: 2016-11-01

## 2016-10-27 MED ORDER — CHLORHEXIDINE GLUCONATE 0.12% ORAL RINSE (MEDLINE KIT)
15.0000 mL | Freq: Two times a day (BID) | OROMUCOSAL | Status: DC
  Administered 2016-10-27 – 2016-11-03 (×14): 15 mL via OROMUCOSAL

## 2016-10-27 MED ORDER — DEXTROSE 5 % IV SOLN
2.0000 ug/min | INTRAVENOUS | Status: DC
  Administered 2016-10-27: 6 ug/min via INTRAVENOUS
  Administered 2016-10-28: 8 ug/min via INTRAVENOUS
  Filled 2016-10-27 (×2): qty 4

## 2016-10-27 MED ORDER — HEPARIN SODIUM (PORCINE) 1000 UNIT/ML IJ SOLN
INTRAMUSCULAR | Status: AC
  Filled 2016-10-27: qty 1

## 2016-10-27 MED ORDER — FENTANYL CITRATE (PF) 250 MCG/5ML IJ SOLN
INTRAMUSCULAR | Status: DC | PRN
  Administered 2016-10-27: 50 ug via INTRAVENOUS
  Administered 2016-10-27: 150 ug via INTRAVENOUS
  Administered 2016-10-27: 50 ug via INTRAVENOUS

## 2016-10-27 MED ORDER — PHENYLEPHRINE HCL 10 MG/ML IJ SOLN
INTRAVENOUS | Status: DC | PRN
  Administered 2016-10-27: 50 ug/min via INTRAVENOUS

## 2016-10-27 MED ORDER — FENTANYL CITRATE (PF) 250 MCG/5ML IJ SOLN
INTRAMUSCULAR | Status: AC
  Filled 2016-10-27: qty 5

## 2016-10-27 MED ORDER — NOREPINEPHRINE BITARTRATE 1 MG/ML IV SOLN
0.0000 ug/min | INTRAVENOUS | Status: DC
  Filled 2016-10-27: qty 4

## 2016-10-27 MED ORDER — SODIUM CHLORIDE 0.9 % IV SOLN
Freq: Once | INTRAVENOUS | Status: AC
  Administered 2016-10-27: 20:00:00 via INTRAVENOUS

## 2016-10-27 MED ORDER — PHENOL 1.4 % MT LIQD: 1.0000 | OROMUCOSAL | Status: DC | PRN

## 2016-10-27 MED ORDER — ALBUMIN HUMAN 5 % IV SOLN
INTRAVENOUS | Status: DC | PRN
  Administered 2016-10-27 (×2): via INTRAVENOUS

## 2016-10-27 MED ORDER — METOPROLOL TARTRATE 5 MG/5ML IV SOLN: 2.0000 mg | INTRAVENOUS | Status: DC | PRN

## 2016-10-27 MED ORDER — MIDAZOLAM HCL 2 MG/2ML IJ SOLN
1.0000 mg | INTRAMUSCULAR | Status: DC | PRN
  Administered 2016-10-27 – 2016-10-28 (×2): 1 mg via INTRAVENOUS

## 2016-10-27 MED ORDER — FENTANYL BOLUS VIA INFUSION
25.0000 ug | INTRAVENOUS | Status: DC | PRN
  Administered 2016-10-29: 25 ug via INTRAVENOUS
  Filled 2016-10-27: qty 25

## 2016-10-27 MED ORDER — SODIUM CHLORIDE 0.9 % IV SOLN
INTRAVENOUS | Status: DC | PRN
  Administered 2016-10-27: 500 mL

## 2016-10-27 MED ORDER — DOCUSATE SODIUM 100 MG PO CAPS: 100.0000 mg | ORAL_CAPSULE | Freq: Every day | ORAL | Status: DC

## 2016-10-27 MED ORDER — FENTANYL CITRATE (PF) 100 MCG/2ML IJ SOLN: 50.0000 ug | Freq: Once | INTRAMUSCULAR | Status: DC

## 2016-10-27 MED ORDER — SODIUM CHLORIDE 0.9 % IV BOLUS (SEPSIS)
500.0000 mL | Freq: Once | INTRAVENOUS | Status: AC
Start: 2016-10-27 — End: 2016-10-27
  Administered 2016-10-27: 500 mL via INTRAVENOUS

## 2016-10-27 MED ORDER — ETOMIDATE 2 MG/ML IV SOLN
INTRAVENOUS | Status: DC | PRN
  Administered 2016-10-27: 12 mg via INTRAVENOUS

## 2016-10-27 MED ORDER — MAGNESIUM SULFATE 2 GM/50ML IV SOLN: 2.0000 g | Freq: Every day | INTRAVENOUS | Status: DC | PRN

## 2016-10-27 MED ORDER — POTASSIUM CHLORIDE CRYS ER 20 MEQ PO TBCR: 20.0000 meq | EXTENDED_RELEASE_TABLET | Freq: Every day | ORAL | Status: DC | PRN

## 2016-10-27 MED ORDER — PANTOPRAZOLE SODIUM 40 MG IV SOLR
40.0000 mg | Freq: Every day | INTRAVENOUS | Status: DC
  Administered 2016-10-27 – 2016-11-02 (×7): 40 mg via INTRAVENOUS
  Filled 2016-10-27 (×7): qty 40

## 2016-10-27 MED ORDER — PHENYLEPHRINE 40 MCG/ML (10ML) SYRINGE FOR IV PUSH (FOR BLOOD PRESSURE SUPPORT)
PREFILLED_SYRINGE | INTRAVENOUS | Status: AC
  Filled 2016-10-27: qty 10

## 2016-10-27 MED ORDER — ACETAMINOPHEN 325 MG PO TABS
325.0000 mg | ORAL_TABLET | ORAL | Status: DC | PRN
Start: 2016-10-27 — End: 2016-10-27

## 2016-10-27 MED ORDER — SUCCINYLCHOLINE CHLORIDE 20 MG/ML IJ SOLN
INTRAMUSCULAR | Status: DC | PRN
  Administered 2016-10-27: 100 mg via INTRAVENOUS

## 2016-10-27 MED ORDER — KCL IN DEXTROSE-NACL 20-5-0.45 MEQ/L-%-% IV SOLN
INTRAVENOUS | Status: DC
  Administered 2016-10-27: 20:00:00 via INTRAVENOUS
  Administered 2016-10-28: 75 mL/h via INTRAVENOUS
  Administered 2016-10-30: 19:00:00 via INTRAVENOUS
  Administered 2016-10-31: 75 mL/h via INTRAVENOUS
  Filled 2016-10-27 (×11): qty 1000

## 2016-10-27 MED ORDER — LIDOCAINE HCL (CARDIAC) 20 MG/ML IV SOLN
INTRAVENOUS | Status: DC | PRN
  Administered 2016-10-27: 40 mg via INTRAVENOUS

## 2016-10-27 MED ORDER — PROPOFOL 10 MG/ML IV BOLUS
INTRAVENOUS | Status: AC
  Filled 2016-10-27: qty 20

## 2016-10-27 MED ORDER — CALCIUM GLUCONATE 10 % IV SOLN
INTRAVENOUS | Status: DC | PRN
  Administered 2016-10-27: 0.5 g via INTRAVENOUS

## 2016-10-27 MED ORDER — ONDANSETRON HCL 4 MG/2ML IJ SOLN: 4.0000 mg | Freq: Four times a day (QID) | INTRAMUSCULAR | Status: DC | PRN

## 2016-10-27 MED ORDER — DEXMEDETOMIDINE HCL IN NACL 200 MCG/50ML IV SOLN
INTRAVENOUS | Status: AC
  Filled 2016-10-27: qty 50

## 2016-10-27 MED ORDER — MIDAZOLAM HCL 2 MG/2ML IJ SOLN
1.0000 mg | INTRAMUSCULAR | Status: DC | PRN
  Administered 2016-10-27: 1 mg via INTRAVENOUS
  Filled 2016-10-27 (×3): qty 2

## 2016-10-27 MED ORDER — FENTANYL CITRATE (PF) 100 MCG/2ML IJ SOLN: 50.0000 ug | INTRAMUSCULAR | Status: DC | PRN

## 2016-10-27 MED ORDER — SODIUM CHLORIDE 0.9 % IV SOLN
25.0000 ug/h | INTRAVENOUS | Status: DC
  Administered 2016-10-27: 50 ug/h via INTRAVENOUS
  Administered 2016-10-29 (×2): 75 ug/h via INTRAVENOUS
  Administered 2016-10-31 – 2016-11-01 (×2): 150 ug/h via INTRAVENOUS
  Filled 2016-10-27 (×8): qty 50

## 2016-10-27 MED ORDER — ORAL CARE MOUTH RINSE
15.0000 mL | OROMUCOSAL | Status: DC
  Administered 2016-10-27 – 2016-11-02 (×60): 15 mL via OROMUCOSAL

## 2016-10-27 MED ORDER — ACETAMINOPHEN 325 MG RE SUPP
325.0000 mg | RECTAL | Status: DC | PRN
Start: 2016-10-27 — End: 2016-10-27

## 2016-10-27 MED ORDER — ALUM & MAG HYDROXIDE-SIMETH 200-200-20 MG/5ML PO SUSP
15.0000 mL | ORAL | Status: DC | PRN
Start: 2016-10-27 — End: 2016-11-03

## 2016-10-27 MED ORDER — DEXTROSE 5 % IV SOLN
INTRAVENOUS | Status: DC | PRN
  Administered 2016-10-27: 1 ug/min via INTRAVENOUS

## 2016-10-27 MED ORDER — ROCURONIUM BROMIDE 100 MG/10ML IV SOLN
INTRAVENOUS | Status: DC | PRN
Start: 2016-10-27 — End: 2016-10-27
  Administered 2016-10-27 (×2): 50 mg via INTRAVENOUS

## 2016-10-27 MED ORDER — 0.9 % SODIUM CHLORIDE (POUR BTL) OPTIME
TOPICAL | Status: DC | PRN
  Administered 2016-10-27: 1000 mL

## 2016-10-27 MED ORDER — GUAIFENESIN-DM 100-10 MG/5ML PO SYRP: 15.0000 mL | ORAL_SOLUTION | ORAL | Status: DC | PRN

## 2016-10-27 MED ORDER — CEFAZOLIN SODIUM 1 G IJ SOLR
INTRAMUSCULAR | Status: DC | PRN
  Administered 2016-10-27: 2 g via INTRAMUSCULAR

## 2016-10-27 MED ORDER — HYDRALAZINE HCL 20 MG/ML IJ SOLN: 5.0000 mg | INTRAMUSCULAR | Status: DC | PRN

## 2016-10-27 MED ORDER — LACTATED RINGERS IV SOLN
INTRAVENOUS | Status: DC | PRN
  Administered 2016-10-27 (×2): via INTRAVENOUS

## 2016-10-27 MED ORDER — IODIXANOL 320 MG/ML IV SOLN
INTRAVENOUS | Status: DC | PRN
  Administered 2016-10-27: 70 mL via INTRA_ARTERIAL

## 2016-10-27 MED ORDER — SODIUM CHLORIDE 0.9 % IV SOLN
10.0000 mL/h | Freq: Once | INTRAVENOUS | Status: AC
  Administered 2016-10-27: 13:00:00 via INTRAVENOUS

## 2016-10-27 MED ORDER — DEXTROSE 5 % IV SOLN
1.5000 g | Freq: Two times a day (BID) | INTRAVENOUS | Status: AC
  Administered 2016-10-27 – 2016-10-28 (×2): 1.5 g via INTRAVENOUS
  Filled 2016-10-27 (×2): qty 1.5

## 2016-10-27 SURGICAL SUPPLY — 90 items
BAG DECANTER FOR FLEXI CONT (MISCELLANEOUS) IMPLANT
BAG SNAP BAND KOVER 36X36 (MISCELLANEOUS) ×3 IMPLANT
BALLN CODA OCL 2-9.0-35-120-3 (BALLOONS)
BALLOON COD OCL 2-9.0-35-120-3 (BALLOONS) IMPLANT
CANISTER SUCTION 2500CC (MISCELLANEOUS) ×3 IMPLANT
CATH ANGIO 5F BER2 65CM (CATHETERS) ×3 IMPLANT
CATH COUDE FOLEY 5CC 14FR (CATHETERS) ×3 IMPLANT
CATH LANTERN 025 135CM 45TIP (MISCELLANEOUS) ×3 IMPLANT
CATH OMNI FLUSH .035X70CM (CATHETERS) ×3 IMPLANT
CATH SOFT-VU 4F 65 STRAIGHT (CATHETERS) ×2 IMPLANT
CATH SOFT-VU ST 4F 90CM (CATHETERS) ×3 IMPLANT
CATH SOFT-VU STRAIGHT 4F 65CM (CATHETERS) ×1
CATH VANSCHIE 4 5FR RDC 65CM (CATHETERS) ×2 IMPLANT
CLIP TI MEDIUM 24 (CLIP) IMPLANT
CLIP TI WIDE RED SMALL 24 (CLIP) IMPLANT
COIL 400 COMPLEX STD 6X20CM (Vascular Products) ×3 IMPLANT
COIL IDC 2D .035 6MMX10CM (Vascular Products) ×3 IMPLANT
COVER MAYO STAND STRL (DRAPES) ×3 IMPLANT
DERMABOND ADVANCED (GAUZE/BANDAGES/DRESSINGS) ×1
DERMABOND ADVANCED .7 DNX12 (GAUZE/BANDAGES/DRESSINGS) ×2 IMPLANT
DEVICE CLOSURE PERCLS PRGLD 6F (VASCULAR PRODUCTS) ×4 IMPLANT
DEVICE TORQUE 50000 (MISCELLANEOUS) IMPLANT
DEVICE TORQUE H2O (MISCELLANEOUS) ×3 IMPLANT
DRAIN CHANNEL 10F 3/8 F FF (DRAIN) IMPLANT
DRAIN CHANNEL 10M FLAT 3/4 FLT (DRAIN) IMPLANT
DRAPE TABLE COVER HEAVY DUTY (DRAPES) IMPLANT
DRSG TEGADERM 2-3/8X2-3/4 SM (GAUZE/BANDAGES/DRESSINGS) ×3 IMPLANT
ELECT REM PT RETURN 9FT ADLT (ELECTROSURGICAL) ×6
ELECTRODE REM PT RTRN 9FT ADLT (ELECTROSURGICAL) ×4 IMPLANT
EVACUATOR 3/16  PVC DRAIN (DRAIN)
EVACUATOR 3/16 PVC DRAIN (DRAIN) IMPLANT
EVACUATOR SILICONE 100CC (DRAIN) IMPLANT
GAUZE SPONGE 2X2 8PLY STRL LF (GAUZE/BANDAGES/DRESSINGS) ×2 IMPLANT
GLOVE BIO SURGEON STRL SZ 6.5 (GLOVE) ×6 IMPLANT
GLOVE BIOGEL PI IND STRL 6.5 (GLOVE) ×2 IMPLANT
GLOVE BIOGEL PI IND STRL 7.0 (GLOVE) ×4 IMPLANT
GLOVE BIOGEL PI IND STRL 7.5 (GLOVE) ×2 IMPLANT
GLOVE BIOGEL PI INDICATOR 6.5 (GLOVE) ×1
GLOVE BIOGEL PI INDICATOR 7.0 (GLOVE) ×2
GLOVE BIOGEL PI INDICATOR 7.5 (GLOVE) ×1
GLOVE SURG SS PI 7.0 STRL IVOR (GLOVE) ×3 IMPLANT
GLOVE SURG SS PI 7.5 STRL IVOR (GLOVE) ×3 IMPLANT
GOWN STRL REUS W/ TWL LRG LVL3 (GOWN DISPOSABLE) ×4 IMPLANT
GOWN STRL REUS W/ TWL XL LVL3 (GOWN DISPOSABLE) ×2 IMPLANT
GOWN STRL REUS W/TWL LRG LVL3 (GOWN DISPOSABLE) ×2
GOWN STRL REUS W/TWL XL LVL3 (GOWN DISPOSABLE) ×1
GRAFT BALLN CATH 65CM (STENTS) ×2 IMPLANT
GUIDEWIRE ANGLED .035X150CM (WIRE) ×3 IMPLANT
HEMOSTAT SNOW SURGICEL 2X4 (HEMOSTASIS) IMPLANT
INSERT FOGARTY 61MM (MISCELLANEOUS) IMPLANT
INSERT FOGARTY SM (MISCELLANEOUS) IMPLANT
KIT BASIN OR (CUSTOM PROCEDURE TRAY) ×3 IMPLANT
KIT ROOM TURNOVER OR (KITS) ×3 IMPLANT
LOOP VESSEL MAXI BLUE (MISCELLANEOUS) ×3 IMPLANT
NEEDLE PERC 18GX7CM (NEEDLE) ×6 IMPLANT
NS IRRIG 1000ML POUR BTL (IV SOLUTION) ×3 IMPLANT
PACK ENDOVASCULAR (PACKS) ×3 IMPLANT
PAD ARMBOARD 7.5X6 YLW CONV (MISCELLANEOUS) ×6 IMPLANT
PENCIL BUTTON HOLSTER BLD 10FT (ELECTRODE) ×3 IMPLANT
PERCLOSE PROGLIDE 6F (VASCULAR PRODUCTS) ×6
PLUG VASCULAR AMPLATZER 10MM (Vascular Products) ×3 IMPLANT
SET MICROPUNCTURE 5F STIFF (MISCELLANEOUS) ×3 IMPLANT
SHEATH AVANTI 11CM 8FR (MISCELLANEOUS) ×3 IMPLANT
SHEATH BRITE TIP 7FR 35CM (SHEATH) ×3 IMPLANT
SHEATH FLEXOR ANSEL 1 7F 45CM (SHEATH) ×6 IMPLANT
SHIELD RADPAD SCOOP 12X17 (MISCELLANEOUS) ×6 IMPLANT
SPONGE GAUZE 2X2 STER 10/PKG (GAUZE/BANDAGES/DRESSINGS) ×1
STENT GRAFT BALLN CATH 65CM (STENTS) ×1
STOPCOCK MORSE 400PSI 3WAY (MISCELLANEOUS) ×3 IMPLANT
SUT ETHILON 3 0 PS 1 (SUTURE) IMPLANT
SUT PROLENE 5 0 C 1 24 (SUTURE) IMPLANT
SUT VIC AB 2-0 CT1 27 (SUTURE)
SUT VIC AB 2-0 CT1 TAPERPNT 27 (SUTURE) IMPLANT
SUT VIC AB 3-0 SH 27 (SUTURE)
SUT VIC AB 3-0 SH 27X BRD (SUTURE) IMPLANT
SUT VIC AB 4-0 PS2 27 (SUTURE) ×3 IMPLANT
SUT VICRYL 4-0 PS2 18IN ABS (SUTURE) ×6 IMPLANT
SYR 20CC LL (SYRINGE) ×6 IMPLANT
SYR 30ML LL (SYRINGE) IMPLANT
SYR 5ML LL (SYRINGE) IMPLANT
SYR MEDRAD MARK V 150ML (SYRINGE) IMPLANT
SYRINGE 10CC LL (SYRINGE) ×9 IMPLANT
TRAY FOLEY CATH 14FRSI W/METER (CATHETERS) ×3 IMPLANT
TRAY FOLEY W/METER SILVER 16FR (SET/KITS/TRAYS/PACK) IMPLANT
TUBING HIGH PRESSURE 120CM (CONNECTOR) ×6 IMPLANT
VANSCHIE 4 5FR RDC 65CM (CATHETERS) ×3
WATER STERILE IRR 1000ML POUR (IV SOLUTION) ×3 IMPLANT
WIRE AMPLATZ SS-J .035X180CM (WIRE) ×6 IMPLANT
WIRE BENTSON .035X145CM (WIRE) ×6 IMPLANT
WIRE ROSEN-J .035X260CM (WIRE) ×3 IMPLANT

## 2016-10-27 NOTE — Anesthesia Procedure Notes (Signed)
Central Venous Catheter Insertion Performed by: Roberts Gaudy, anesthesiologist Start/End01/10/2016 3:00 PM, 10/27/2016 3:00 PM Preanesthetic checklist: patient identified, site marked, risks and benefits discussed, surgical consent, monitors and equipment checked, pre-op evaluation and timeout performed Position: supine Catheter size: 12 Fr Total catheter length 15. Central line was placed.Triple lumen Ultrasound Notes:anatomy identified, needle tip was noted to be adjacent to the nerve/plexus identified, no ultrasound evidence of intravascular and/or intraneural injection and image(s) printed for medical record Attempts: 1 Following insertion, line sutured, dressing applied and Biopatch. Post procedure assessment: blood return through all ports, free fluid flow and no air  Patient tolerated the procedure well with no immediate complications.

## 2016-10-27 NOTE — ED Provider Notes (Signed)
[facility]-EMERGENCY DEPT Provider Note   CSN: [id number] Arrival date & time: 10/15/2016  0272   History   Chief Complaint Chief Complaint  Patient presents with  . Back Pain    HPI Miguel Swanson is a 83 y.o. male.  HPI   Level V - Dementia Code Status- DNR  Pt has PMH of chronic low back pain, AAA (last repair approx 2011), CAD, dysphagia, malignant neoplasm of his bladder, dementia, hx of a.fib, comes to the ER  Brought in by son for evaluation. He was doing very well and then Wednesday he stopped eating and drinking food. He is complaining of back pain and abdominal pain on the left side. In the room the patient is hypoxic, I put 2 L [place] on him, he has increased respiratory rate. The patient is hard of hearing but will tell me his stomach hurts, otherwise unable to obtain any further HPI from him  Past Medical History:  Diagnosis Date  . AAA (abdominal aortic aneurysm) (HCC) 12/2011, 9/20131   s/p endovasc graft repair [facility], repeat 06/2012  . CAD (coronary artery disease)   . Chronic low back pain    ddd  . Coronary atherosclerosis of unspecified type of vessel, native or graft    cabg  . Dysphagia   . Esophageal reflux   . Hyperlipidemia   . Malignant neoplasm of bladder, part unspecified   . Other diseases of lung, not elsewhere classified   . Papillary carcinoma (HCC) 2007  . Peripheral vascular disease, unspecified   . Unspecified essential hypertension     Patient Active Problem List   Diagnosis Date Noted  . Cognitive change 09/06/2014  . Confusion state 09/06/2014  . Atrial fibrillation (HCC) 08/27/2014  . Routine general medical examination at a health care facility 08/10/2013  . Combined systolic and diastolic heart failure (HCC) 11/01/2012  . S/P CABG (coronary artery bypass graft) remote.   . Arthritis of low back   . Hyperglycemia 07/21/2011  . Dementia arising in the senium and presenium 07/21/2011  . OA (osteoarthritis) 01/16/2011  . Hyperlipidemia  with target LDL less than 70 06/10/2010  . TOBACCO ABUSE 05/28/2010  . BLADDER CANCER 05/27/2010  . Essential hypertension 05/27/2010  . CORONARY ARTERY DISEASE 05/27/2010  . Peripheral vascular disease (HCC) 05/27/2010  . G E R D 05/27/2010  . H/O abdominal aortic aneurysm repair, Open repair remote and July 2013 endograft placement at [facility], [place] 10/30/1997    Past Surgical History:  Procedure Laterality Date  . ABDOMINAL AORTIC ANEURYSM REPAIR  12/2011, 07/04/12  . ABDOMINAL AORTIC ANEURYSM REPAIR  2005  . ABDOMINAL AORTIC ENDOVASCULAR STENT GRAFT  3/13, 9/13  . BALLOON DILATION  11/03/2012   Procedure: BALLOON DILATION;  Surgeon: Beverley Fiedler, MD;  Location: [facility] ENDOSCOPY;  Service: Gastroenterology;  Laterality: N/A;  . CARDIOVERSION N/A 08/28/2014   Procedure: CARDIOVERSION;  Surgeon: Pamella Pert, MD;  Location: [facility] ENDOSCOPY;  Service: Cardiovascular;  Laterality: N/A;  . CORONARY ARTERY BYPASS GRAFT    . CORONARY ARTERY BYPASS GRAFT  2004  . ESOPHAGOGASTRODUODENOSCOPY  11/03/2012   Procedure: ESOPHAGOGASTRODUODENOSCOPY (EGD);  Surgeon: Beverley Fiedler, MD;  Location: [facility] ENDOSCOPY;  Service: Gastroenterology;  Laterality: N/A;  the dlilation is a possibility  . INGUINAL HERNIA REPAIR     Bilateral  . INGUINAL HERNIA REPAIR     bil  . TRANSURETHRAL RESECTION OF BLADDER  2007   resection of papilary tumor and placement of ureteral stent  Home Medications    Prior to Admission medications   Medication Sig Start Date End Date Taking? Authorizing Provider  apixaban (ELIQUIS) 2.5 MG TABS tablet Take 2.5 mg by mouth 2 (two) times daily.  04/26/14  Yes Historical Provider, MD  losartan (COZAAR) 50 MG tablet TAKE 1 TABLET BY MOUTH DAILY Patient taking differently: TAKE 50 mg BY MOUTH DAILY 07/30/15  Yes Etta Grandchild, MD  Multiple Vitamin (MULTIVITAMIN WITH MINERALS) TABS Take 1 tablet by mouth daily. 11/05/12  Yes Vassie Loll, MD  polyethylene glycol powder  (GLYCOLAX/MIRALAX) powder USE 1 CAPFUL MIXED IN 8 OUNCES OF LIQUID DAILY FOR CONSTIPATION 10/29/13  Yes Etta Grandchild, MD  ranitidine (ZANTAC) 150 MG tablet TAKE 1 TABLET BY MOUTH TWICE DAILY Patient taking differently: TAKE 150 mg BY MOUTH TWICE DAILY 04/26/16  Yes Etta Grandchild, MD  atorvastatin (LIPITOR) 40 MG tablet TAKE 1 TABLET BY MOUTH EVERY EVENING FOR HIGH CHOLESTEROL Patient not taking: Reported on November 08, 2016 10/31/14   Etta Grandchild, MD  donepezil (ARICEPT) 10 MG tablet TAKE 1 TABLET BY MOUTH EVERY NIGHT AT BEDTIME Patient not taking: Reported on 11/08/16 10/03/15   Anson Fret, MD  gabapentin (NEURONTIN) 300 MG capsule TAKE 1 CAPSULE BY MOUTH AT BEDTIME FOR NEUROPATHIC PAIN Patient not taking: Reported on 2016-11-08 09/12/15   Etta Grandchild, MD  Melatonin 3 MG TABS TAKE 1 TABLET BY MOUTH AT BEDTIME FOR SLEEP Patient not taking: Reported on 08-Nov-2016 03/24/13   Amala Petion L Reed, DO  rOPINIRole (REQUIP) 0.25 MG tablet TAKE 1 TABLET BY MOUTH 2 HOURS BEFORE BEDTIME Patient not taking: Reported on Nov 08, 2016 07/30/15   Etta Grandchild, MD  traZODone (DESYREL) 50 MG tablet TAKE 1 TO 2 TABLETS BY MOUTH EVERY EVENING Patient not taking: Reported on 2016-11-08 08/27/15   Etta Grandchild, MD    Family History Family History  Problem Relation Age of Onset  . Cancer Father   . Prostate cancer Other   . Hypertension Other   . Heart disease Son     Social History Social History  Substance Use Topics  . Smoking status: Never Smoker  . Smokeless tobacco: Never Used  . Alcohol use No     Allergies   Lisinopril   Review of Systems Review of Systems Review of Systems All other systems negative except as documented in the HPI. All pertinent positives and negatives as reviewed in the HPI.   Physical Exam Updated Vital Signs BP (!) 94/54   Pulse 88   Temp 98.4 F (36.9 C) (Oral)   Resp 24   SpO2 98%   Physical Exam  Constitutional: He appears well-developed and well-nourished.  No distress.  HENT:  Head: Normocephalic and atraumatic.  Eyes: Conjunctivae and EOM are normal. Pupils are equal, round, and reactive to light. No scleral icterus.  Neck: Normal range of motion. Neck supple.  Cardiovascular: Normal rate, regular rhythm, normal heart sounds and intact distal pulses.   Pulmonary/Chest: Effort normal. Tachypnea (and hypoxic) noted. No respiratory distress. He has no wheezes. He has no rales. He exhibits no tenderness.  Abdominal: Soft. There is no tenderness.  Pt is holding stomach and laying on his left side. Abd is soft, pt winces in pain with palpation. Points that it hurts worse to the epigastric region and suprapubic region.  Musculoskeletal: He exhibits no edema or tenderness.  Neurological: He is alert. He displays a negative Romberg sign.  No facial paralysis or slurring of speech, patient is alert  and oriented to self. Unable to asses cranial nerves d/t pts inability to follow commands.  +baseline tremor.  Skin: Skin is warm and dry. No petechiae, no purpura and no rash noted. He is not diaphoretic.  Psychiatric: His speech is not slurred. Cognition and memory are impaired.  Nursing note and vitals reviewed.   ED Treatments / Results  Labs (all labs ordered are listed, but only abnormal results are displayed) Labs Reviewed  CBC WITH DIFFERENTIAL/PLATELET - Abnormal; Notable for the following:       Result Value   RBC 2.82 (*)    Hemoglobin 9.2 (*)    HCT 27.0 (*)    Neutro Abs 7.8 (*)    All other components within normal limits  COMPREHENSIVE METABOLIC PANEL - Abnormal; Notable for the following:    Glucose, Bld 103 (*)    BUN 35 (*)    Creatinine, Ser 1.29 (*)    Calcium 8.8 (*)    ALT 14 (*)    Total Bilirubin 2.0 (*)    GFR calc non Af Amer 48 (*)    GFR calc Af Amer 55 (*)    All other components within normal limits  PROTIME-INR - Abnormal; Notable for the following:    Prothrombin Time 20.8 (*)    All other components within  normal limits  URINALYSIS, ROUTINE W REFLEX MICROSCOPIC    EKG  EKG Interpretation None       Radiology Dg Chest Port 1 View  Result Date: 10/22/2016 CLINICAL DATA:  Hypoxia. EXAM: PORTABLE CHEST 1 VIEW COMPARISON:  08/10/2013. FINDINGS: Prior CABG. Severe cardiomegaly. Prior aortic stent graft. Bibasilar atelectasis and/or infiltrates. No pleural effusion or pneumothorax.   IMPRESSION: 1. Prior CABG.  Cardiomegaly.  Aortic stent graft noted. 2. Mild bibasilar atelectasis and/or scarring. No acute abnormality identified . Chest is stable from prior exam. Electronically Signed   By: Maisie Fus  Register   On: 10/18/2016 10:33   Dg Abd Portable 1 View  Result Date: 11/04/2016 CLINICAL DATA:  Hypoxia. EXAM: PORTABLE ABDOMEN - 1 VIEW COMPARISON:  10/06/2012. FINDINGS: Soft tissue structures are unremarkable. No evidence of bowel obstruction. No evidence free air. Interposition of the right colon under the left hemidiaphragm noted. Aortic and bilateral stent grafts noted. These appear to be in stable position. Pelvic calcifications consistent with phleboliths . Degenerative changes lumbar spine and both hips. Bilateral pleural-parenchymal thickening noted in the lung bases. Changes most consistent scarring.   IMPRESSION: 1.  No evidence of bowel obstruction or free air. 2. Aortic and bilateral renal stent grafts noted in stable position. Electronically Signed   By: Maisie Fus  Register   On: 10/25/2016 10:35    Procedures Procedures (including critical care time)  Medications Ordered in ED Medications  fentaNYL (SUBLIMAZE) injection 50 mcg (50 mcg Intravenous Given 10/08/2016 1145)  iopamidol (ISOVUE-370) 76 % injection (100 mLs  Contrast Given 10/15/2016 1212)     Initial Impression / Assessment and Plan / ED Course  I have reviewed the triage vital signs and the nursing notes.  Pertinent labs & imaging results that were available during my care of the patient were reviewed by me and considered  in my medical decision making (see chart for details).  Shared visit with Dr. Clydene Pugh. Patients son is present, Dr. Clydene Pugh has made him aware of the diagnosis of AAA and how guarded the situation is. We attempted to get patient over to [facility] but the patients status declined and he will be transferred over  to Dr. Myra Gianotti to [facility] for the OR.  Dr. Clydene Pugh had discussed with son who is power of attorney regarding the patients DNR status, he reports paperwork is at home in the safe but confirms x 3 that patient is DNR.  CRITICAL CARE Performed by: Dorthula Matas Total critical care time: 120 minutes Critical care time was exclusive of separately billable procedures and treating other patients. Critical care was necessary to treat or prevent imminent or life-threatening deterioration. Critical care was time spent personally by me on the following activities: development of treatment plan with patient and/or surrogate as well as nursing, discussions with consultants, evaluation of patient's response to treatment, examination of patient, obtaining history from patient or surrogate, ordering and performing treatments and interventions, ordering and review of laboratory studies, ordering and review of radiographic studies, pulse oximetry and re-evaluation of patient's condition.   Final Clinical Impressions(s) / ED Diagnoses   Final diagnoses:  Ruptured aortic aneurysm, unspecified portion of aorta [facility])    New Prescriptions New Prescriptions   No medications on file     Marlon Pel, PA-C 10/23/2016 1245    Marlon Pel, PA-C 10/23/2016 1252    Lyndal Pulley, MD 11/01/2016 1304

## 2016-10-27 NOTE — Anesthesia Preprocedure Evaluation (Addendum)
Anesthesia Evaluation  Patient identified by MRN, date of birth, ID band Patient confused    Reviewed: Allergy & Precautions, Patient's Chart, lab work & pertinent test results  Airway Mallampati: III       Dental  (+) Edentulous Upper, Edentulous Lower   Pulmonary     + decreased breath sounds      Cardiovascular hypertension,  Rhythm:Regular Rate:Tachycardia     Neuro/Psych    GI/Hepatic   Endo/Other    Renal/GU      Musculoskeletal   Abdominal   Peds  Hematology   Anesthesia Other Findings   Reproductive/Obstetrics                             Anesthesia Physical Anesthesia Plan  ASA: IV and emergent  Anesthesia Plan: General   Post-op Pain Management:    Induction: Intravenous  Airway Management Planned: Oral ETT  Additional Equipment:   Intra-op Plan:   Post-operative Plan: Post-operative intubation/ventilation  Informed Consent: I have reviewed the patients History and Physical, chart, labs and discussed the procedure including the risks, benefits and alternatives for the proposed anesthesia with the patient or authorized representative who has indicated his/her understanding and acceptance.     Plan Discussed with: CRNA and Anesthesiologist  Anesthesia Plan Comments:         Anesthesia Quick Evaluation

## 2016-10-27 NOTE — Anesthesia Postprocedure Evaluation (Signed)
Anesthesia Post Note  Patient: MUSAAB GEDDIS  Procedure(s) Performed: Procedure(s) (LRB): EMBOLIZATION RIGHT RENAL ARTERY (Right) REPAIR RUPTURED ANEURYSM (Right)  Patient location during evaluation: SICU Anesthesia Type: General Level of consciousness: awake, sedated and patient remains intubated per anesthesia plan Pain management: pain level controlled Vital Signs Assessment: post-procedure vital signs reviewed and stable Respiratory status: patient on ventilator - see flowsheet for VS and patient remains intubated per anesthesia plan Cardiovascular status: blood pressure returned to baseline Anesthetic complications: no       Last Vitals:  Vitals:   10/22/2016 1217 10/17/2016 1240  BP: 136/58 (!) 94/54  Pulse: 93 88  Resp: 20 24  Temp:      Last Pain:  Vitals:   10/13/2016 0832  TempSrc: Oral  PainSc:                  Jeannelle Wiens COKER

## 2016-10-27 NOTE — Progress Notes (Signed)
[facility] Progress Note Patient Name: Miguel Swanson DOB: 03/29/1926 MRN: [id number]   Date of Service  11/03/2016  HPI/Events of Note  Hb=6.3  eICU Interventions  Transfuse 1 unit prbc.      Intervention Category Major Interventions: Hemorrhage - evaluation and management  Laverle Hobby 10/28/2016, 6:59 PM

## 2016-10-27 NOTE — Consult Note (Signed)
PULMONARY / CRITICAL CARE MEDICINE   Name: Miguel Swanson MRN: [id number] DOB: May 07, 1926    ADMISSION DATE:  10/15/2016 CONSULTATION DATE:  1/23  REFERRING MD:  Dr. Trula Slade   CHIEF COMPLAINT:  Ruptured R renal artery stent.   HISTORY OF PRESENT ILLNESS:   83 year old male with PMH as below, which is significant for vascular disease including AAA (s/p several procedures at [facility] in past), CAD, GERD, dementia, and atrial fibrillation. He presented to [facility] ED 1/23 with complaints of left sided back pain radiating to pelvis, which had been progressive for the past week. BP labile in ED and based on his history he was sent for CT angiogram to assess for AAA. CT demonstrated large AAA with endoleak and rupture. Initially his surgical team at [facility] was called for transfer, however, he then became more hypotensive and lethargic requiring intubation and was transferred to [facility] for emergent vascular surgery evaluation. He was taken to the OR under Dr. Trula Slade for coil embolization of R renal artery and repair of ruptured aortic aneurysm. The procedure was without complication and he was sent to the PACU and eventually ICU on ventilator. PCCM to see.   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  PAST SURGICAL HISTORY: He  has a past surgical history that includes Coronary artery bypass graft; Abdominal aortic aneurysm repair (12/2011, 07/04/12); Inguinal hernia repair; Transurethral resection of bladder (2007); Coronary artery bypass graft (2004);  Abdominal aortic aneurysm repair (2005); Inguinal hernia repair; Abdominal aortic endovascular stent graft (3/13, 9/13); Esophagogastroduodenoscopy (11/03/2012); Balloon dilation (11/03/2012); and Cardioversion (N/A, 08/28/2014).  Allergies  Allergen Reactions  . Lisinopril     Throat clearing    No current facility-administered medications on file prior to encounter.    Current Outpatient Prescriptions on File Prior to Encounter  Medication Sig  . apixaban (ELIQUIS) 2.5 MG TABS tablet Take 2.5 mg by mouth 2 (two) times daily.   Marland Kitchen losartan (COZAAR) 50 MG tablet TAKE 1 TABLET BY MOUTH DAILY (Patient taking differently: TAKE 50 mg BY MOUTH DAILY)  . Multiple Vitamin (MULTIVITAMIN WITH MINERALS) TABS Take 1 tablet by mouth daily.  . polyethylene glycol powder (GLYCOLAX/MIRALAX) powder USE 1 CAPFUL MIXED IN 8 OUNCES OF LIQUID DAILY FOR CONSTIPATION  . ranitidine (ZANTAC) 150 MG tablet TAKE 1 TABLET BY MOUTH TWICE DAILY (Patient taking differently: TAKE 150 mg BY MOUTH TWICE DAILY)  . atorvastatin (LIPITOR) 40 MG tablet TAKE 1 TABLET BY MOUTH EVERY EVENING FOR HIGH CHOLESTEROL (Patient not taking: Reported on 10/18/2016)  . donepezil (ARICEPT) 10 MG tablet TAKE 1 TABLET BY MOUTH EVERY NIGHT AT BEDTIME (Patient not taking: Reported on 10/10/2016)  . gabapentin (NEURONTIN) 300 MG capsule TAKE 1 CAPSULE BY MOUTH AT BEDTIME FOR NEUROPATHIC PAIN (Patient not taking: Reported on 10/07/2016)  . Melatonin 3 MG TABS TAKE 1 TABLET BY MOUTH AT BEDTIME FOR SLEEP (Patient not taking: Reported on 10/06/2016)  . rOPINIRole (REQUIP) 0.25 MG tablet TAKE 1 TABLET BY MOUTH 2 HOURS BEFORE BEDTIME (Patient not taking: Reported on 11/01/2016)  . traZODone (DESYREL) 50 MG tablet TAKE 1 TO 2 TABLETS BY MOUTH EVERY EVENING (Patient not  taking: Reported on 10/07/2016)    FAMILY HISTORY:  His indicated that his mother is deceased. He indicated that his father is deceased. He indicated that the status of his son is unknown. He  indicated that the status of his other is unknown.    SOCIAL HISTORY: He  reports that he has never smoked. He has never used smokeless tobacco. He reports that he does not drink alcohol or use drugs.  REVIEW OF SYSTEMS:   Unable as patient in encephalopathic and intubated  SUBJECTIVE:  unable  VITAL SIGNS: BP (!) 94/54   Pulse 88   Temp 98.4 F (36.9 C) (Oral)   Resp 24   SpO2 98%   HEMODYNAMICS:    VENTILATOR SETTINGS: Vent Mode: PRVC FiO2 (%):  [50 %] 50 % Set Rate:  [12 bmp] 12 bmp Vt Set:  [520 mL] 520 mL PEEP:  [5 cmH20] 5 cmH20 Plateau Pressure:  [18 cmH20] 18 cmH20  INTAKE / OUTPUT: No intake/output data recorded.  PHYSICAL EXAMINATION: General:  Elderly male on Vent Neuro:  Sedated on vent HEENT:  [place]/AT, PERRL, no JVD Cardiovascular:  Loletha Grayer, regular, no MRG Lungs:  Clear bilateral breath sounds Abdomen:  Soft, non-distended Musculoskeletal:  No acute deformity Skin:  Grossly intact  LABS:  BMET  Recent Labs Lab 10/21/2016 1047 10/24/2016 1427  NA 138 139  K 4.3 5.2*  CL 105  --   CO2 23  --   BUN 35*  --   CREATININE 1.29*  --   GLUCOSE 103*  --     Electrolytes  Recent Labs Lab 10/11/2016 1047  CALCIUM 8.8*    CBC  Recent Labs Lab 10/13/2016 1047 10/18/2016 1427  WBC 9.4  --   HGB 9.2* 10.5*  HCT 27.0* 31.0*  PLT 219  --     Coag's  Recent Labs Lab 11/03/2016 1047  INR 1.77    Sepsis Markers No results for input(s): LATICACIDVEN, PROCALCITON, O2SATVEN in the last 168 hours.  ABG  Recent Labs Lab 10/09/2016 1427  PHART 7.388  PCO2ART 36.0  PO2ART 84.0    Liver Enzymes  Recent Labs Lab 10/18/2016 1047  AST 18  ALT 14*  ALKPHOS 88  BILITOT 2.0*  ALBUMIN 3.7    Cardiac Enzymes No results for input(s): TROPONINI, PROBNP in the last 168 hours.  Glucose No results for input(s): GLUCAP in the last 168 hours.  Imaging Dg Chest Port 1 View  Result Date: 11/03/2016 CLINICAL DATA:  Hypoxia. EXAM: PORTABLE CHEST 1  VIEW COMPARISON:  08/10/2013. FINDINGS: Prior CABG. Severe cardiomegaly. Prior aortic stent graft. Bibasilar atelectasis and/or infiltrates. No pleural effusion or pneumothorax. IMPRESSION: 1. Prior CABG.  Cardiomegaly.  Aortic stent graft noted. 2. Mild bibasilar atelectasis and/or scarring. No acute abnormality identified . Chest is stable from prior exam. Electronically Signed   By: Marcello Moores  Register   On: 10/26/2016 10:33   Dg Abd Portable 1 View  Result Date: 10/11/2016 CLINICAL DATA:  Hypoxia. EXAM: PORTABLE ABDOMEN - 1 VIEW COMPARISON:  10/06/2012. FINDINGS: Soft tissue structures are unremarkable. No evidence of bowel obstruction. No evidence free air. Interposition of the right colon under the left hemidiaphragm noted. Aortic and bilateral stent grafts noted. These appear to be in stable position. Pelvic calcifications consistent with phleboliths . Degenerative changes lumbar spine and both hips. Bilateral pleural-parenchymal thickening noted in the lung bases. Changes most consistent scarring. IMPRESSION: 1.  No evidence of bowel obstruction or free air. 2. Aortic and bilateral renal stent grafts  noted in stable position. Electronically Signed   By: Marcello Moores  Register   On: 10/30/2016 10:35   Ct Angio Chest/abd/pel For Dissection W And/or Wo Contrast  Result Date: 10/12/2016 CLINICAL DATA:  Severe abdominal and pelvic pain. History of thoracoabdominal aortic aneurysm and status post fenestrated endovascular repair with branch devices involving the celiac, SMA and bilateral renal arteries. EXAM: CT ANGIOGRAPHY CHEST, ABDOMEN AND PELVIS TECHNIQUE: Multidetector CT imaging through the chest, abdomen and pelvis was performed using the standard protocol during bolus administration of intravenous contrast. Multiplanar reconstructed images and MIPs were obtained and reviewed to evaluate the vascular anatomy. CONTRAST:  100 mL Isovue 370 COMPARISON:  Chest CT 08/20/2010.  05/01/2014 FINDINGS: CTA CHEST  FINDINGS Cardiovascular: There is an aortic stent graft in the mid descending thoracic aorta with extends into the abdomen. Aneurysm sac in the distal thoracic aorta measures up to 4.7 cm, previously measured 4.5 cm in 2015. Main pulmonary arteries are patent. No gross abnormality to the ascending thoracic aorta. Mediastinum/Nodes: No significant chest lymphadenopathy. No significant axillary lymphadenopathy. Lungs/Pleura: Small left pleural effusion. Trachea and mainstem bronchi are patent. Limited evaluation of the lungs due to excessive motion artifact. There are some emphysematous changes. Volume loss at the left lung base. There are a few scattered pulmonary nodules. Most of the visualized nodules are similar to the exam in 2011. Largest nodule is along the medial right lower lobe on sequence 12, image 54 measuring up to 0.6 cm. Again noted are blebs along the posterior lower lobes. Musculoskeletal: Patient has had a median sternotomy. No acute bone abnormality. Review of the MIP images confirms the above findings. CTA ABDOMEN AND PELVIS FINDINGS VASCULAR Aorta: Patient has had endovascular repair of the thoracoabdominal aortic aneurysm with a fenestrated aortic stent graft and branches involving the celiac artery, SMA and bilateral renal arteries. Again noted is a thoracoabdominal aortic aneurysm which has massively enlarged in size. Previously, there were three focal areas of aneurysm in the abdominal aorta. The aneurysm near the hiatus measures roughly 9.0 x 7.3 cm and previously measured 6.7 x 6.1 cm. The mid abdominal aneurysm measures 15.0 x 10.5 cm and previously measured 7.1 x 6.1 cm. The distal abdominal aortic aneurysm measures 4.9 x 4.6 cm and previously measured 3.9 x 4.0 cm. Patient has aortic stent graft involving the proximal and mid abdominal aorta. This is a fenestrated graft with branches involving the celiac trunk, SMA and bilateral renal arteries. The delayed images demonstrate a large  amount of contrast within the aneurysm sac and compatible with a massive endoleak. In addition, there is evidence for an aortic rupture, probably along the left upper aspect of the aneurysm. The aortic stent graft starts in the mid descending thoracic aorta and extends into the distal abdominal aorta. However, the distal aspect of stent lies within the most distal abdominal aortic aneurysm and this distal abdominal aortic aneurysm is not completely excluded. Based on the location of the stent graft in the distal abdominal aorta, patient may be at risk for a type 1 B endoleak in this area. However, there does not appear to be a definite endoleak involving the most distal aortic aneurysm sac. Celiac: Fenestrated graft with a celiac artery stent that is patent. Again noted is significant stenosis along the distal aspect of the stent. There is flow in the main branches of the celiac trunk. SMA: The SMA stent is patent and there is flow in the SMA. Renals: The left renal artery branch comes off the anterior  aspect of the aortic stent and there is contrast within this left renal artery stent. Left renal artery is patent. Patient has a right renal artery stent which is now broken and there are 2 separate pieces. Unclear if this represents a broken stent or complete separation of 2 stents. There is arterial flow within the proximal right renal artery stent that terminates in the aneurysm sac. This is most likely the source for the endoleak. There is contrast filling distal right renal artery stent on the delayed images suggesting delayed flow in the right renal artery via the massive aortic aneurysm sac secondary to the endoleak. There is flow in the right kidney on the delayed images which is not present on the arterial phase images. IMA: Occluded at the origin. Not well visualized on this examination. Inflow: Bilateral iliac arteries are patent with wall calcifications. There is no significant stenosis involving the  external or common iliac arteries. Proximal femoral arteries are patent bilaterally. Veins: IVC is patent. Review of the MIP images confirms the above findings. NON-VASCULAR Hepatobiliary: Limited evaluation of the solid organs due to excessive motion artifact. No gross abnormality the liver or gallbladder. Pancreas: Pancreas is displaced anteriorly due to the massive aortic aneurysm and retroperitoneal hemorrhage. No significant pancreatic duct dilatation. Spleen: There is blood surround the medial aspect of the spleen related to the aortic rupture. Adrenals/Urinary Tract: Adrenal glands not well visualized. There is no flow in the right kidney on the initial arterial phase imaging. However, there is delayed filling of the right kidney secondary to flow from the endoleak and blood flow in the aneurysm sac. Left kidney is displaced with some compression secondary to the retroperitoneal hematoma. Again noted are areas of scarring in the left kidney. Stomach/Bowel: Limited evaluation due to motion artifact. There is no significant bowel dilatation. No evidence to suggest a bowel obstruction. Lymphatic: No significant lymphadenopathy in the abdomen or pelvis. Reproductive: Calcifications in the prostate.  No gross abnormality. Other: There is a large amount of high-density fluid in the left abdomen surrounding the left kidney and adjacent to the left side of the aorta. Findings are compatible with an aortic rupture and retroperitoneal hemorrhage. Musculoskeletal: No acute bone abnormality. Review of the MIP images confirms the above findings. IMPRESSION: Abdominal aortic aneurysm rupture with retroperitoneal hemorrhage. The abdominal aortic aneurysm sac has massively enlarged since 2015 and measures up to 15 cm. The aneurysm sac enlargement is secondary to a large endoleak which is probably due to the broken right renal artery stents. Blood is probably filling the aneurysm sac through the right renal artery stent.  Delayed filling of the right kidney secondary to the complications associated the right renal artery stents. Small left pleural effusion. Again noted are small pulmonary nodules which have not significant changed since 2011 but poorly characterized on this examination. Critical Value/emergent results were called by telephone at the time of interpretation on 11/04/2016 at 12:25 pm to [facility], PA-C , who verbally acknowledged these results. Electronically Signed   By: Markus Daft M.D.   On: 10/06/2016 13:34    STUDIES:  1/23 CTA chest/abd/pelvis > Abdominal aortic aneurysm rupture with retroperitoneal hemorrhage. The abdominal aortic aneurysm sac has massively enlarged since 2015 and measures up to 15 cm. The aneurysm sac enlargement is secondary to a large endoleak which is probably due to the broken right renal artery stents.  CULTURES:   ANTIBIOTICS: Periop cefuroxime  SIGNIFICANT EVENTS: 1/23 AAA rupture, to OR, then ICU on vent.  LINES/TUBES:  ETT 1/23 >>> RIJ CVL 1/23 >>>  DISCUSSION: 83 year old male with history of vascular disease and AAA with repair x2 presented with ruptured AAA 1/23 and underwent emergent endovascular repair. He was recovered in ICU on vent and remains intubated.  ASSESSMENT / PLAN:  PULMONARY A: VDRF for airway protection  P:   Full vent support Repeat ABG VAP prevention bundle SBT in AM for hopeful extubation  CARDIOVASCULAR A:  Shock secondary to hemorrhage AAA with rupture Bradycardia > likely secondary to precedex infusion H/o Hyperlipidemia, CAD  P:  Telemetry BP goals per vascular surgery Levophed titrated for BP goal DC Precedex, sedation as below. Distal pulse checks  RENAL A:   AKI Hyperkalemia  P:   Gentle hydration Repeat BMP  GASTROINTESTINAL A:   GERD  P:   NPO Protonix IV  HEMATOLOGIC A:   Acute blood loss anemia  P:  Trend CBC Transfuse for hemoglobin < 8 SCDs  INFECTIOUS A:   Periop ppx  P:    Cefuroxime per primary  ENDOCRINE A:   No acute issues  P:   Follow glucose on chemistry  NEUROLOGIC A:   Acute metabolic encephalopathy Dementia baseline dysphagia  P:   RASS goal: -1 to -2 Fentanyl infusion for sedation PRN versed   FAMILY  - Updates: No family present  - Inter-disciplinary family meet or Palliative Care meeting due by:  1/30   Georgann Housekeeper, AGACNP-BC [place] [facility]/Critical Care Pager [phone] or [phone]  10/21/2016 6:32 PM     e

## 2016-10-27 NOTE — Progress Notes (Signed)
POST-OP   S/p emergent embolization of right renal artery for active extravasation from renal stent separation resulting in aneurysm growth to 15cm and rupture  Patient remains intubated and stable on minimal blood pressure support Abdomen remains soft Palpable pedal pulses  GU:  Unsuccessful attempt x 2 of placement of foley in OR.  Patient spontaneously voided upon return to ICU and condom catheter placed.  Creatinine up to 1.6 from 1.3 pre-op.  In 2015, patient has a creatinine of 1.6.  I suspect this will continue to rise overnight.  He will likely need urology help for catheter placement.  Will check bladder scan to see if he is making any urine.  I discussed with the family that he may go into renal failure.  Acute blood loss anemia:  Most of the patient's blood loss is contained within his aneurysm and not in the RP space, which is why his abdomen remains soft.  His Hb was 10 post op and dropped to 6 after the case.  I suspect he was dehydrated and he is equilibrating.  I would imagine he bled at least 4-5 units of blood in his aneurysm sac.  He will get 2 units of blood now and repeat his CBC  Pulm:  OR duration was not very long, so I would try to extubate him as soon as reasonably possible  Case discussed with CCM.  Appreciate their support  Given his age and presentation, his chance for a  Good outcome is low   Miguel Swanson

## 2016-10-27 NOTE — Progress Notes (Signed)
CRITICAL VALUE ALERT  Critical value received:  Hgb 6.3  Date of notification:  11/03/2016  Time of notification:  [id number]  Critical value read back:Yes.    Nurse who received alert:  Benjaman Pott  MD notified (1st page):  Dr. Ashby Dawes  Time of first page:  [phone]  MD notified (2nd page):  Time of second page:  Responding MD:  Dr. Ashby Dawes  Time MD responded:  [phone]

## 2016-10-27 NOTE — Transfer of Care (Signed)
Immediate Anesthesia Transfer of Care Note  Patient: Miguel Swanson  Procedure(s) Performed: Procedure(s): EMBOLIZATION RIGHT RENAL ARTERY (Right) REPAIR RUPTURED ANEURYSM (Right)  Patient Location: SICU  Anesthesia Type:General  Level of Consciousness: sedated, patient cooperative and Patient remains intubated per anesthesia plan  Airway & Oxygen Therapy: Patient remains intubated per anesthesia plan and Patient placed on Ventilator (see vital sign flow sheet for setting)  Post-op Assessment: Report given to RN and Post -op Vital signs reviewed and stable  Post vital signs: Reviewed and stable  Last Vitals:  Vitals:   10/24/2016 1217 10/30/2016 1240  BP: 136/58 (!) 94/54  Pulse: 93 88  Resp: 20 24  Temp:      Last Pain:  Vitals:   11/04/2016 0832  TempSrc: Oral  PainSc:          Complications: No apparent anesthesia complications

## 2016-10-27 NOTE — Op Note (Signed)
    Patient name: Miguel Swanson MRN: [id number] DOB: 1926-01-10 Sex: male  10/11/2016 Pre-operative Diagnosis: #1 Ruptured abdominal aortic aneurysm #2 separation/fracture of right renal artery overlapping stents Post-operative diagnosis:  Same Surgeon:  Annamarie Major Assistants:  Silva Bandy Procedure:   #1: Ultrasound guided access, right femoral artery   #2: Abdominal aortogram   #3: Coil embolization, right renal artery   #4: Closure device, right groin   #5: Repair of ruptured abdominal aortic aneurysm Anesthesia:  Gen. Blood Loss:  See anesthesia record Specimens:  None  Findings:  Fracture of the proximal right renal stent in its distal portion.  The distal portion and the second stent were free-floating within the aneurysm.  Indications:  The patient has a history of a 4 vessel branched stent graft aneurysm repair.  He came to the emergency department with abdominal pain and hypotension.  CT scan revealed a fractured right renal stent as well as a ruptured aneurysm.  I had a discussion with the patient's family regarding treatment options including palliative care versus an attempt at repair.  They strongly wanted to proceed with an attempted repair.  Procedure:  The patient was identified in the holding area and taken to [place] 16  The patient was then placed supine on the table. general anesthesia was administered.  The patient was prepped and draped in the usual sterile fashion.  A time out was called and antibiotics were administered.  Ultrasound was used to evaluate the right common femoral artery which was patent without significant calcification.  It was cannulated under ultrasound guidance the micropuncture needle.  An 018 wire was advanced without resistance follow by micropuncture sheath.  An 035 wire was then inserted followed by a 7 French 45 cm Ansel 1 sheath.  I did not give the patient heparin.  I initially perform an aortogram which confirmed active extravasation  through the right renal artery into the aneurysm sac.  I initially tried a Berenstein catheter to select the right renal artery however I ultimately ended up using a renal double curve catheter.  This enabled me to get a [facility] wire into the aneurysm sac.  The sheath was then advanced over the catheter and I was able to performed a contrast injection which revealed direct communication to the aneurysm sac.  I selected a Amplatzer plug and deployed this into the renal stent.  I had difficulty keeping access in the renal stent because of the angle as well as how shortly stent was compared to the length of the Amplatzer plug.  I ultimately had to deploy the plug with a large majority of the plug out into the aneurysm sac.  I took completion images that showed successful embolization, however there was still a area and the proximal stent that I wanted to fill.  Therefore I initially tried to place a inter-LOC 6 x 10 coil however I did not have enough stability and the platform to deployed this coil and therefore I selected a Penumbra Ruby 6 x 20 coil which filled the proximal portion of the stent.  Completion imaging showed successful embolization.  At this point I performed closure of the right groin access site with a pro-glide.  The patient will remain intubated and taken back to the ICU in stable condition.   Disposition:  To PACU in stable condition.   Theotis Burrow, M.D. [facility] Office: [phone] Pager:  [phone]

## 2016-10-27 NOTE — ED Notes (Signed)
Bed: WA07 Expected date:  Expected time:  Means of arrival:  Comments: EMS syncope 

## 2016-10-27 NOTE — Anesthesia Procedure Notes (Signed)
Procedure Name: Intubation Date/Time: 10/25/2016 2:15 PM Performed by: Lance Coon Pre-anesthesia Checklist: Patient identified, Emergency Drugs available, Suction available, Patient being monitored and Timeout performed Patient Re-evaluated:Patient Re-evaluated prior to inductionOxygen Delivery Method: Circle system utilized Preoxygenation: Pre-oxygenation with 100% oxygen Intubation Type: IV induction, Rapid sequence and Cricoid Pressure applied Grade View: Grade I Tube type: Oral Tube size: 7.5 mm Number of attempts: 1 Airway Equipment and Method: Stylet and Video-laryngoscopy Placement Confirmation: ETT inserted through vocal cords under direct vision,  positive ETCO2,  CO2 detector and breath sounds checked- equal and bilateral Secured at: 23 cm Tube secured with: Tape Dental Injury: Teeth and Oropharynx as per pre-operative assessment

## 2016-10-27 NOTE — Progress Notes (Signed)
CRITICAL VALUE ALERT  Critical value received:  Troponin 0.06  Date of notification:  10/29/2016  Time of notification:  2152  Critical value read back: Yes  Nurse who received alert:  Naaman Plummer, RN  MD notified (1st page):  Mark/E-link  Time of first page: [phone]

## 2016-10-27 NOTE — Progress Notes (Signed)
[facility] Progress Note Patient Name: Miguel Swanson DOB: [birth year] MRN: [id number]   Date of Service  10/05/2016  HPI/Events of Note  H/o of AAA repair at [facility], now with rupture of grafted right renal art stent>> endovascular repair. 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  Hypotension with bradycardia on precedex, no orders for levophed.   eICU Interventions  Placed order for levophed. Asked RN to stop precedex, placed order for fentanyl infusion.      Intervention Category Major Interventions: Hypotension - evaluation and management  Laverle Hobby 10/06/2016, 5:43 PM

## 2016-10-27 NOTE — ED Triage Notes (Addendum)
Per EMS-left back pain radiating to left pelvic area into groin-started last week-denies dysuria, patient has a history of dementia-patient incontinent with strong odor-son gave patient 2 (losartan) of his BP meds-was told by fire that his BP was high initially

## 2016-10-27 NOTE — ED Notes (Signed)
Patient transported to CT 

## 2016-10-28 ENCOUNTER — Inpatient Hospital Stay (HOSPITAL_COMMUNITY): Payer: Medicare Other

## 2016-10-28 DIAGNOSIS — I715 Thoracoabdominal aortic aneurysm, ruptured: Secondary | ICD-10-CM

## 2016-10-28 DIAGNOSIS — I509 Heart failure, unspecified: Secondary | ICD-10-CM

## 2016-10-28 DIAGNOSIS — J9601 Acute respiratory failure with hypoxia: Secondary | ICD-10-CM

## 2016-10-28 DIAGNOSIS — G9340 Encephalopathy, unspecified: Secondary | ICD-10-CM

## 2016-10-28 LAB — URINALYSIS, ROUTINE W REFLEX MICROSCOPIC
BILIRUBIN URINE: NEGATIVE
Glucose, UA: NEGATIVE mg/dL
Ketones, ur: NEGATIVE mg/dL
LEUKOCYTES UA: NEGATIVE
Nitrite: NEGATIVE
PH: 5 (ref 5.0–8.0)
Protein, ur: 30 mg/dL — AB
SQUAMOUS EPITHELIAL / LPF: NONE SEEN

## 2016-10-28 LAB — HEMOGLOBIN AND HEMATOCRIT, BLOOD
HEMATOCRIT: 23.9 % — AB (ref 39.0–52.0)
HEMATOCRIT: 22.8 % — AB (ref 39.0–52.0)
HEMOGLOBIN: 7.9 g/dL — AB (ref 13.0–17.0)
HEMOGLOBIN: 7.7 g/dL — AB (ref 13.0–17.0)

## 2016-10-28 LAB — BASIC METABOLIC PANEL
Anion gap: 10 (ref 5–15)
Anion gap: 10 (ref 5–15)
BUN: 32 mg/dL — ABNORMAL HIGH (ref 6–20)
BUN: 35 mg/dL — AB (ref 6–20)
CALCIUM: 8.1 mg/dL — AB (ref 8.9–10.3)
CHLORIDE: 108 mmol/L (ref 101–111)
CHLORIDE: 108 mmol/L (ref 101–111)
CO2: 20 mmol/L — ABNORMAL LOW (ref 22–32)
CO2: 20 mmol/L — ABNORMAL LOW (ref 22–32)
CREATININE: 2 mg/dL — AB (ref 0.61–1.24)
CREATININE: 1.82 mg/dL — AB (ref 0.61–1.24)
Calcium: 8 mg/dL — ABNORMAL LOW (ref 8.9–10.3)
GFR calc Af Amer: 33 mL/min — ABNORMAL LOW (ref >60)
GFR calc non Af Amer: 28 mL/min — ABNORMAL LOW (ref >60)
GFR, EST AFRICAN AMERICAN: 37 mL/min — AB (ref >60)
GFR, EST NON AFRICAN AMERICAN: 32 mL/min — AB (ref >60)
Glucose, Bld: 137 mg/dL — ABNORMAL HIGH (ref 65–99)
Glucose, Bld: 116 mg/dL — ABNORMAL HIGH (ref 65–99)
POTASSIUM: 4.2 mmol/L (ref 3.5–5.1)
Potassium: 4.3 mmol/L (ref 3.5–5.1)
SODIUM: 138 mmol/L (ref 135–145)
Sodium: 138 mmol/L (ref 135–145)

## 2016-10-28 LAB — HEPATIC FUNCTION PANEL
ALT: 10 U/L — ABNORMAL LOW (ref 17–63)
AST: 14 U/L — ABNORMAL LOW (ref 15–41)
Albumin: 2.9 g/dL — ABNORMAL LOW (ref 3.5–5.0)
Alkaline Phosphatase: 59 U/L (ref 38–126)
Bilirubin, Direct: 0.3 mg/dL (ref 0.1–0.5)
Indirect Bilirubin: 0.7 mg/dL (ref 0.3–0.9)
TOTAL PROTEIN: 5.5 g/dL — AB (ref 6.5–8.1)
Total Bilirubin: 1 mg/dL (ref 0.3–1.2)

## 2016-10-28 LAB — CBC WITH DIFFERENTIAL/PLATELET
BASOS ABS: 0 10*3/uL (ref 0.0–0.1)
BASOS PCT: 0 %
EOS ABS: 0.1 10*3/uL (ref 0.0–0.7)
EOS PCT: 1 %
HCT: 23.9 % — ABNORMAL LOW (ref 39.0–52.0)
Hemoglobin: 8 g/dL — ABNORMAL LOW (ref 13.0–17.0)
LYMPHS PCT: 16 %
Lymphs Abs: 1.8 10*3/uL (ref 0.7–4.0)
MCH: 30.8 pg (ref 26.0–34.0)
MCHC: 33.5 g/dL (ref 30.0–36.0)
MCV: 91.9 fL (ref 78.0–100.0)
Monocytes Absolute: 1 10*3/uL (ref 0.1–1.0)
Monocytes Relative: 9 %
Neutro Abs: 8.6 10*3/uL — ABNORMAL HIGH (ref 1.7–7.7)
Neutrophils Relative %: 75 %
PLATELETS: 195 10*3/uL (ref 150–400)
RBC: 2.6 MIL/uL — AB (ref 4.22–5.81)
RDW: 15.7 % — ABNORMAL HIGH (ref 11.5–15.5)
WBC: 11.4 10*3/uL — AB (ref 4.0–10.5)

## 2016-10-28 LAB — LACTIC ACID, PLASMA: LACTIC ACID, VENOUS: 1 mmol/L (ref 0.5–1.9)

## 2016-10-28 LAB — TROPONIN I: TROPONIN I: 0.08 ng/mL — AB (ref <0.03)

## 2016-10-28 LAB — PHOSPHORUS: PHOSPHORUS: 2.5 mg/dL (ref 2.5–4.6)

## 2016-10-28 LAB — MAGNESIUM: MAGNESIUM: 2 mg/dL (ref 1.7–2.4)

## 2016-10-28 MED ORDER — CHLORHEXIDINE GLUCONATE CLOTH 2 % EX PADS
6.0000 | MEDICATED_PAD | Freq: Every day | CUTANEOUS | Status: AC
  Administered 2016-10-28 – 2016-11-02 (×4): 6 via TOPICAL

## 2016-10-28 MED ORDER — ASPIRIN EC 325 MG PO TBEC: 325.0000 mg | DELAYED_RELEASE_TABLET | Freq: Every day | ORAL | Status: DC

## 2016-10-28 MED ORDER — ACETAMINOPHEN 325 MG PO TABS
325.0000 mg | ORAL_TABLET | ORAL | Status: DC | PRN
  Administered 2016-11-01: 650 mg via ORAL
  Filled 2016-10-28: qty 2

## 2016-10-28 MED ORDER — OXYCODONE HCL 5 MG PO TABS: 5.0000 mg | ORAL_TABLET | ORAL | Status: DC | PRN

## 2016-10-28 MED ORDER — GABAPENTIN 250 MG/5ML PO SOLN
300.0000 mg | Freq: Every day | ORAL | Status: DC
  Administered 2016-10-29 – 2016-11-02 (×5): 300 mg
  Filled 2016-10-28 (×7): qty 6

## 2016-10-28 MED ORDER — SODIUM CHLORIDE 0.9 % IV SOLN: 500.0000 mL | Freq: Once | INTRAVENOUS | Status: DC | PRN

## 2016-10-28 MED ORDER — POTASSIUM CHLORIDE CRYS ER 20 MEQ PO TBCR: 20.0000 meq | EXTENDED_RELEASE_TABLET | Freq: Every day | ORAL | Status: DC | PRN

## 2016-10-28 MED ORDER — MUPIROCIN 2 % EX OINT
1.0000 "application " | TOPICAL_OINTMENT | Freq: Two times a day (BID) | CUTANEOUS | Status: AC
  Administered 2016-10-28 – 2016-11-01 (×10): 1 via NASAL
  Filled 2016-10-28: qty 22

## 2016-10-28 MED ORDER — DONEPEZIL HCL 10 MG PO TABS
10.0000 mg | ORAL_TABLET | Freq: Every day | ORAL | Status: DC
  Administered 2016-10-29 – 2016-11-02 (×5): 10 mg
  Filled 2016-10-28 (×5): qty 1

## 2016-10-28 MED ORDER — METOPROLOL TARTRATE 5 MG/5ML IV SOLN: 2.0000 mg | INTRAVENOUS | Status: DC | PRN

## 2016-10-28 MED ORDER — ACETAMINOPHEN 325 MG RE SUPP: 325.0000 mg | RECTAL | Status: DC | PRN

## 2016-10-28 MED ORDER — ATORVASTATIN CALCIUM 40 MG PO TABS
40.0000 mg | ORAL_TABLET | Freq: Every day | ORAL | Status: DC
  Administered 2016-10-30 – 2016-11-02 (×4): 40 mg
  Filled 2016-10-28 (×4): qty 1

## 2016-10-28 MED ORDER — MIDAZOLAM HCL 2 MG/2ML IJ SOLN
1.0000 mg | INTRAMUSCULAR | Status: DC | PRN
  Administered 2016-10-30 – 2016-11-01 (×20): 2 mg via INTRAVENOUS
  Filled 2016-10-28: qty 4
  Filled 2016-10-28 (×2): qty 2
  Filled 2016-10-28: qty 4
  Filled 2016-10-28 (×5): qty 2
  Filled 2016-10-28 (×2): qty 4
  Filled 2016-10-28 (×2): qty 2
  Filled 2016-10-28 (×2): qty 4

## 2016-10-28 MED ORDER — HYDRALAZINE HCL 20 MG/ML IJ SOLN: 5.0000 mg | INTRAMUSCULAR | Status: DC | PRN

## 2016-10-28 MED ORDER — LABETALOL HCL 5 MG/ML IV SOLN: 10.0000 mg | INTRAVENOUS | Status: DC | PRN

## 2016-10-28 MED ORDER — PHENOL 1.4 % MT LIQD: 1.0000 | OROMUCOSAL | Status: DC | PRN

## 2016-10-28 MED ORDER — MORPHINE SULFATE (PF) 2 MG/ML IV SOLN: 2.0000 mg | INTRAVENOUS | Status: DC | PRN

## 2016-10-28 MED ORDER — DEXTROSE 5 % IV SOLN
1.5000 g | Freq: Two times a day (BID) | INTRAVENOUS | Status: AC
  Administered 2016-10-28 – 2016-10-29 (×2): 1.5 g via INTRAVENOUS
  Filled 2016-10-28 (×2): qty 1.5

## 2016-10-28 MED ORDER — SODIUM CHLORIDE 0.9 % IV SOLN: INTRAVENOUS | Status: DC

## 2016-10-28 MED ORDER — GUAIFENESIN-DM 100-10 MG/5ML PO SYRP: 15.0000 mL | ORAL_SOLUTION | ORAL | Status: DC | PRN

## 2016-10-28 NOTE — Progress Notes (Addendum)
[facility] [place]  Subjective  - Intubated follows simple commands to move feet.   Objective (!) 98/48 77 99 F (37.2 C) (Oral) 17 100%  Intake/Output Summary (Last 24 hours) at 10/28/16 0717 Last data filed at 10/28/16 0500  Gross per 24 hour  Intake          4391.86 ml  Output               10 ml  Net          4381.86 ml    Right groin soft Feet warm with active range of motion, DP doppler B signals Abdomin with audible min. BS, soft RRR Sinus with PVC's Lungs intubated  Assessment/Planning: POD # 1 Procedure:   #1: Ultrasound guided access, right femoral artery                         #2: Abdominal aortogram                         #3: Coil embolization, right renal artery                         #4: Closure device, right groin                         #5: Repair of ruptured abdominal aortic aneurysm  HGB stable 11.4 after 2 units PRBC Hypotension 102/39 on Levophed 2 mcg/hr Cr 2.0 increased baseline 1.6 in 2015.   UO negative. Bladder scan shows 204 cc Troponin 0.06   COLLINS, EMMA [facility] 10/28/2016 7:17 AM --  Laboratory Lab Results:  Recent Labs  10/31/2016 1751 10/28/16 0140  WBC 10.9* 11.4*  HGB 6.3* 8.0*  HCT 18.8* 23.9*  PLT 231 195   BMET  Recent Labs  10/28/16 0051 10/28/16 0145  NA 138 138  K 4.3 4.2  CL 108 108  CO2 20* 20*  GLUCOSE 137* 116*  BUN 32* 35*  CREATININE 1.82* 2.00*  CALCIUM 8.1* 8.0*    COAG Lab Results  Component Value Date   INR 1.97 11/02/2016   INR 1.77 11/01/2016   INR 1.47 10/29/2012   No results found for: PTT   I agree with the above.  I have seen and examined the patient PE:  Remains intubated but opens eyes to stimulus Feet perfused with good doppler signals, right roin cannulation site ok Abd soft  PLAN: GU:  Bladder scans being used to monitor UOP.  His cr is increasing,  Secondary to hypotension and right renal infarct.  Continue to monitor ABL;  Continue to  transfuse s needed Poor prognosis given initial presentation as well as age and underlying dementia  Wells Vonda Harth

## 2016-10-28 NOTE — Progress Notes (Signed)
RT note-Placed back to full support, RR 38 sustained. Continue to monitor.

## 2016-10-28 NOTE — Progress Notes (Signed)
PULMONARY / CRITICAL CARE MEDICINE   Name: Miguel Swanson MRN: [id number] DOB: 02-28-1926    ADMISSION DATE:  10/22/2016 CONSULTATION DATE:  10/26/2016  REFERRING MD:  Harold Barban, M.D. / Vascular Surgery  CHIEF COMPLAINT:  Ruptured R renal artery stent.   HISTORY OF PRESENT ILLNESS:   83 year old male with PMH as below, which is significant for vascular disease including AAA (s/p several procedures at [facility] in past), CAD, GERD, dementia, and atrial fibrillation. He presented to [facility] ED 1/23 with complaints of left sided back pain radiating to pelvis, which had been progressive for the past week. BP labile in ED and based on his history he was sent for CT angiogram to assess for AAA. CT demonstrated large AAA with endoleak and rupture. Initially his surgical team at [facility] was called for transfer, however, he then became more hypotensive and lethargic requiring intubation and was transferred to [facility] for emergent vascular surgery evaluation. He was taken to the OR under Dr. Trula Slade for coil embolization of R renal artery and repair of ruptured aortic aneurysm. The procedure was without complication and he was sent to the PACU and eventually ICU on ventilator. PCCM to see.   SUBJECTIVE: No acute events overnight. Patient has had minimal to no urine output. He remains on vasopressor.   REVIEW OF SYSTEMS:  Unable to obtain with patient intubated and sedated.  VITAL SIGNS: BP (!) 106/47   Pulse 63   Temp 99 F (37.2 C) (Oral)   Resp 14   Ht 5\' 10"  (1.778 m)   SpO2 100%   HEMODYNAMICS: CVP:  [2 mmHg-9 mmHg] 7 mmHg  VENTILATOR SETTINGS: Vent Mode: CPAP;PSV FiO2 (%):  [40 %-50 %] 40 % Set Rate:  [12 bmp] 12 bmp Vt Set:  [520 mL-580 mL] 580 mL PEEP:  [5 cmH20] 5 cmH20 Pressure Support:  [5 cmH20] 5 cmH20 Plateau Pressure:  [15 cmH20-18 cmH20] 17 cmH20  INTAKE / OUTPUT: I/O last 3 completed shifts: In: 4391.9 [I.V.:2671.9; Blood:670; IV Piggyback:1050] Out: 10  [Blood:10]  PHYSICAL EXAMINATION: General:  No acute distress. No family at bedside.  Integument:  Warm & dry. No rash on exposed skin. Extremities:  No cyanosis or clubbing.  HEENT:  Moist mucus membranes. No scleral injection or icterus. Endotracheal tube in place.  Cardiovascular:  Regular rate. No edema. No appreciable JVD.  Pulmonary:  Good aeration & clear to auscultation bilaterally. Symmetric chest wall rise on ventilator. Abdomen: Soft. Normal bowel sounds. Nondistended.  Musculoskeletal:  Normal bulk and tone. No joint deformity or effusion appreciated. Neurological: Patient opens eyes to voice and seems to attend. However, patient is not following commands.  LABS: CBC Latest Ref Rng & Units 10/28/2016 11/03/2016 10/05/2016  WBC 4.0 - 10.5 K/uL 11.4(H) 10.9(H) -  Hemoglobin 13.0 - 17.0 g/dL 8.0(L) 6.3(LL) 10.5(L)  Hematocrit 39.0 - 52.0 % 23.9(L) 18.8(L) 31.0(L)  Platelets 150 - 400 K/uL 195 231 -   BMP Latest Ref Rng & Units 10/28/2016 10/28/2016 10/06/2016  Glucose 65 - 99 mg/dL 116(H) 137(H) 142(H)  BUN 6 - 20 mg/dL 35(H) 32(H) 34(H)  Creatinine 0.61 - 1.24 mg/dL 2.00(H) 1.82(H) 1.61(H)  BUN/Creat Ratio - - - -  Sodium 135 - 145 mmol/L 138 138 139  Potassium 3.5 - 5.1 mmol/L 4.2 4.3 4.3  Chloride 101 - 111 mmol/L 108 108 107  CO2 22 - 32 mmol/L 20(L) 20(L) 21(L)  Calcium 8.9 - 10.3 mg/dL 8.0(L) 8.1(L) 8.3(L)    STUDIES:  CTA  CHEST/ABD/PELVIS 1/23: Abdominal aortic aneurysm rupture with retroperitoneal hemorrhage. Aortic aneurysm sac within the abdomen massively enlarged and measuring up to 15 cm secondary to large endoleak likely due to broken right renal artery stents. Delayed filling of the right kidney secondary to complications with right renal artery stents. Small left pleural effusion. Small pulmonary nodules without change since 2011 poorly characterized.  Port CXR 1/24:  Personally reviewed by me.   MICROBIOLOGY: MRSA 1/23:  Negative   ANTIBIOTICS: Cefuroxime  (periop prophylaxis)  SIGNIFICANT EVENTS: 1/23 - AAA rupture, to OR, then ICU on vent.  LINES/TUBES: OETT 7.5 1/23 >>> RIJ CVL 1/23 >>> L Rad Art Line 1/23 >>> OGT 1/23 >>>  ASSESSMENT / PLAN:  83 y.o. male with history of abdominal aortic aneurysm with repair and right renal artery stents admitted with ruptured abdominal aortic aneurysm secondary to broken right renal artery stents taken to the OR for emergent endovascular repair.  1. Abdominal Aortic Aneurysm Rupture:  Status post surgical repair by vascular surgery. Postop management per vascular surgery. 2. Acute Hypoxic Respiratory Failure:  Continuing full ventilator support. Daily SBT & PS weaning continuing. 3. Hypovolemic Shock:  Secondary to abdominal aortic aneurysm rupture. Improving. Continuing to wean Levophed for MAP >65.  4. Acute Renal Failure: Likely secondary to right renal infarction. Monitoring urine output. Trending renal function and electrolytes daily. 5. Anemia: Secondary to acute blood loss. Transfusion for hemoglobin <8.0 or signs of active bleeding. Hgb stable post 4units PRBCs. Trending Hgb q12hr.  6. Acute Encephalopathy: Multifactorial given shock, hypoxia, blood loss, and baseline dementia. 7. Hyperkalemia:  Resolved.  8. Hyperlipidemia:  Resuming home Lipitor. 9. Coronary Artery Disease: Monitor patient on telemetry.  Holding home Lipitor & Cozaar. 10. Essential Hypertension:  Holding home Cozaar. 11. Neuropathy:  Resuming home Neurontin.  12. Bradycardia:  Likely  secondary to Precedex infusion. Precedex discontinued. Continuing to monitor the patient on telemetry. 13. GERD:  Protonix IV daily. Holding home Zantac. 14. Dementia:  Resuming home Aricept. 15. Diet: Patient has baseline dysphagia. 16. Prophylaxis:  Protonix IV qhs & SCDs.  I have spent a total of 32 minutes of critical care time today caring for the patient and reviewing the patient's electronic medical record.   Sonia Baller Ashok Cordia,  M.D. [facility] Pulmonary & Critical Care Pager:  [phone] After 3pm or if no response, call [phone] 10/28/2016 8:51 AM     e

## 2016-10-29 LAB — RENAL FUNCTION PANEL
Albumin: 2.5 g/dL — ABNORMAL LOW (ref 3.5–5.0)
Anion gap: 4 — ABNORMAL LOW (ref 5–15)
BUN: 35 mg/dL — ABNORMAL HIGH (ref 6–20)
CHLORIDE: 112 mmol/L — AB (ref 101–111)
CO2: 20 mmol/L — ABNORMAL LOW (ref 22–32)
Calcium: 7.7 mg/dL — ABNORMAL LOW (ref 8.9–10.3)
Creatinine, Ser: 2.86 mg/dL — ABNORMAL HIGH (ref 0.61–1.24)
GFR calc Af Amer: 21 mL/min — ABNORMAL LOW (ref >60)
GFR, EST NON AFRICAN AMERICAN: 18 mL/min — AB (ref >60)
GLUCOSE: 135 mg/dL — AB (ref 65–99)
POTASSIUM: 4.3 mmol/L (ref 3.5–5.1)
Phosphorus: 3.2 mg/dL (ref 2.5–4.6)
Sodium: 136 mmol/L (ref 135–145)

## 2016-10-29 LAB — CBC WITH DIFFERENTIAL/PLATELET
Basophils Absolute: 0 10*3/uL (ref 0.0–0.1)
Basophils Relative: 0 %
EOS PCT: 2 %
Eosinophils Absolute: 0.2 10*3/uL (ref 0.0–0.7)
HCT: 22.6 % — ABNORMAL LOW (ref 39.0–52.0)
Hemoglobin: 7.5 g/dL — ABNORMAL LOW (ref 13.0–17.0)
LYMPHS ABS: 1.9 10*3/uL (ref 0.7–4.0)
LYMPHS PCT: 16 %
MCH: 31.1 pg (ref 26.0–34.0)
MCHC: 33.2 g/dL (ref 30.0–36.0)
MCV: 93.8 fL (ref 78.0–100.0)
MONO ABS: 0.9 10*3/uL (ref 0.1–1.0)
Monocytes Relative: 8 %
Neutro Abs: 8.7 10*3/uL — ABNORMAL HIGH (ref 1.7–7.7)
Neutrophils Relative %: 74 %
PLATELETS: 203 10*3/uL (ref 150–400)
RBC: 2.41 MIL/uL — ABNORMAL LOW (ref 4.22–5.81)
RDW: 16 % — AB (ref 11.5–15.5)
WBC: 11.7 10*3/uL — ABNORMAL HIGH (ref 4.0–10.5)

## 2016-10-29 LAB — GLUCOSE, CAPILLARY
GLUCOSE-CAPILLARY: 96 mg/dL (ref 65–99)
Glucose-Capillary: 102 mg/dL — ABNORMAL HIGH (ref 65–99)
Glucose-Capillary: 117 mg/dL — ABNORMAL HIGH (ref 65–99)

## 2016-10-29 LAB — HEMOGLOBIN AND HEMATOCRIT, BLOOD
HCT: 24.8 % — ABNORMAL LOW (ref 39.0–52.0)
HEMOGLOBIN: 8.2 g/dL — AB (ref 13.0–17.0)

## 2016-10-29 LAB — PREPARE RBC (CROSSMATCH)

## 2016-10-29 LAB — MAGNESIUM: Magnesium: 2.1 mg/dL (ref 1.7–2.4)

## 2016-10-29 LAB — BLOOD PRODUCT ORDER (VERBAL) VERIFICATION

## 2016-10-29 MED ORDER — SODIUM CHLORIDE 0.9 % IV SOLN
Freq: Once | INTRAVENOUS | Status: AC
  Administered 2016-10-29: 09:00:00 via INTRAVENOUS

## 2016-10-29 MED ORDER — VITAL AF 1.2 CAL PO LIQD
1000.0000 mL | ORAL | Status: DC
  Administered 2016-10-29 – 2016-11-02 (×4): 1000 mL

## 2016-10-29 MED ORDER — JEVITY 1.2 CAL PO LIQD: 1000.0000 mL | ORAL | Status: DC

## 2016-10-29 MED ORDER — PRO-STAT SUGAR FREE PO LIQD
30.0000 mL | Freq: Two times a day (BID) | ORAL | Status: DC
  Administered 2016-10-29 – 2016-11-02 (×9): 30 mL
  Filled 2016-10-29 (×8): qty 30

## 2016-10-29 MED ORDER — ENOXAPARIN SODIUM 30 MG/0.3ML ~~LOC~~ SOLN
30.0000 mg | Freq: Every day | SUBCUTANEOUS | Status: DC
  Administered 2016-10-29 – 2016-11-02 (×5): 30 mg via SUBCUTANEOUS
  Filled 2016-10-29 (×5): qty 0.3

## 2016-10-29 NOTE — Progress Notes (Signed)
PCCM Attending Family Discussion: I spoke at length with the patient's son, daughter, granddaughter, and daughter-in-law today. We discussed his respiratory failure, renal failure, and underlying dementia. Reportedly the patient is able to dress and feed himself at home as well as carry on conversations. However, he does have difficulty performing tasks with complex commands. We discussed the fact that his renal function is continuing to worsen and with his renal dysfunction he is retaining some fluids which are likely contributing to his respiratory failure. We also discussed at this time it appears as though his bleeding has ceased. Further addressing his wishes family all seem to feel that he would not wish to undergo dialysis, tracheostomy, or PEG tube placement. We did discuss the eventual potential need for a one way extubation as well as the process of palliation with morphine should he have difficulty breathing postextubation. We will continue to support him as we await and hope for renal recovery.  Sonia Baller Ashok Cordia, M.D. [facility] Pulmonary & Critical Care Pager:  [phone] After 3pm or if no response, call [phone] 11:55 AM 10/29/16

## 2016-10-29 NOTE — Progress Notes (Signed)
PULMONARY / CRITICAL CARE MEDICINE   Name: Miguel Swanson MRN: [id number] DOB: 09/12/1926    ADMISSION DATE:  10/23/2016 CONSULTATION DATE:  10/26/2016  REFERRING MD:  Harold Barban, M.D. / Vascular Surgery  CHIEF COMPLAINT:  Ruptured R renal artery stent.   HISTORY OF PRESENT ILLNESS:   83 year old male with PMH as below, which is significant for vascular disease including AAA (s/p several procedures at [facility] in past), CAD, GERD, dementia, and atrial fibrillation. He presented to [facility] ED 1/23 with complaints of left sided back pain radiating to pelvis, which had been progressive for the past week. BP labile in ED and based on his history he was sent for CT angiogram to assess for AAA. CT demonstrated large AAA with endoleak and rupture. Initially his surgical team at [facility] was called for transfer, however, he then became more hypotensive and lethargic requiring intubation and was transferred to [facility] for emergent vascular surgery evaluation. He was taken to the OR under Dr. Trula Slade for coil embolization of R renal artery and repair of ruptured aortic aneurysm. The procedure was without complication and he was sent to the PACU and eventually ICU on ventilator. PCCM to see.   SUBJECTIVE: Patient subsequently off vasopressors overnight. Urine output is improving. Patient failed spontaneous breathing trial this morning on pressure support 5/5.  REVIEW OF SYSTEMS:  Unable to obtain with patient intubated and sedated.  VITAL SIGNS: BP (!) 101/51   Pulse 73   Temp 99.3 F (37.4 C) (Axillary)   Resp 19   Ht 5\' 10"  (1.778 m)   SpO2 98%   HEMODYNAMICS: CVP:  [3 mmHg-88 mmHg] 4 mmHg  VENTILATOR SETTINGS: Vent Mode: CPAP;PSV FiO2 (%):  [40 %] 40 % Set Rate:  [12 bmp] 12 bmp Vt Set:  [580 mL] 580 mL PEEP:  [5 cmH20] 5 cmH20 Pressure Support:  [5 cmH20-10 cmH20] 10 cmH20 Plateau Pressure:  [13 cmH20-17 cmH20] 15 cmH20  INTAKE / OUTPUT: I/O last 3 completed shifts: In: 4681.2  [I.V.:3411.2; Blood:670; IV [id number] Out: 500 [Urine:450; Emesis/NG output:50]  PHYSICAL EXAMINATION: General:  No acute distress. No family at bedside. Eyes closed. Integument:  Warm & dry. No rash on exposed skin. Extremities:  No cyanosis or clubbing.  HEENT:  No scleral injection or icterus. Endotracheal tube in place.  Cardiovascular:  Regular rate. No edema. No appreciable JVD.  Pulmonary:  Clear with auscultation. Symmetric chest wall rise on ventilator. Abdomen: Soft. Normoactive bowel sounds. Nondistended.  Musculoskeletal:  Normal bulk and tone. No joint deformity or effusion appreciated. Neurological: Patient seems to nod appropriately to questions but is not following commands. Spontaneously moves all 4 extremities. Attends to voice.  LABS: CBC Latest Ref Rng & Units 10/29/2016 10/28/2016 10/28/2016  WBC 4.0 - 10.5 K/uL 11.7(H) - -  Hemoglobin 13.0 - 17.0 g/dL 7.5(L) 7.7(L) 7.9(L)  Hematocrit 39.0 - 52.0 % 22.6(L) 22.8(L) 23.9(L)  Platelets 150 - 400 K/uL 203 - -   BMP Latest Ref Rng & Units 10/29/2016 10/28/2016 10/28/2016  Glucose 65 - 99 mg/dL 135(H) 116(H) 137(H)  BUN 6 - 20 mg/dL 35(H) 35(H) 32(H)  Creatinine 0.61 - 1.24 mg/dL 2.86(H) 2.00(H) 1.82(H)  BUN/Creat Ratio - - - -  Sodium 135 - 145 mmol/L 136 138 138  Potassium 3.5 - 5.1 mmol/L 4.3 4.2 4.3  Chloride 101 - 111 mmol/L 112(H) 108 108  CO2 22 - 32 mmol/L 20(L) 20(L) 20(L)  Calcium 8.9 - 10.3 mg/dL 7.7(L) 8.0(L) 8.1(L)  STUDIES:  CTA CHEST/ABD/PELVIS 1/23: Abdominal aortic aneurysm rupture with retroperitoneal hemorrhage. Aortic aneurysm sac within the abdomen massively enlarged and measuring up to 15 cm secondary to large endoleak likely due to broken right renal artery stents. Delayed filling of the right kidney secondary to complications with right renal artery stents. Small left pleural effusion. Small pulmonary nodules without change since 2011 poorly characterized.  Port CXR 1/24:  Previously  reviewed by me. Minimal silhouetting left hemidiaphragm.Endotracheal tube and enteric feedings to in good position. Right internal jugular central venous catheter in good position.  MICROBIOLOGY: MRSA 1/23:  Negative   ANTIBIOTICS: Cefuroxime (periop prophylaxis)  SIGNIFICANT EVENTS: 1/23 - AAA rupture, to OR, then ICU on vent.  LINES/TUBES: OETT 7.5 1/23 >>> RIJ CVL 1/23 >>> L Rad Art Line 1/23 >>> OGT 1/23 >>>  ASSESSMENT / PLAN:  83 y.o. male with history of abdominal aortic aneurysm with repair and right renal artery stents admitted with ruptured abdominal aortic aneurysm secondary to broken right renal artery stents taken to the OR for emergent endovascular repair. Transfusing packed red blood cells today for anemia. Hopefully this will continue to improve patient's renal function with increasing urine output.  1. Abdominal Aortic Aneurysm Rupture:  Status post surgical repair by vascular surgery. Postop management per vascular surgery. 2. Acute Hypoxic Respiratory Failure:  Continuing full ventilator support. Daily SBT & PS weaning continuing. 3. Hypovolemic Shock:  Resolved. Secondary to abdominal aortic aneurysm rupture. .  4. Acute Renal Failure: Likely secondary to right renal infarction. Monitoring urine output which is improving. Trending renal function and electrolytes daily. 5. Anemia: Secondary to acute blood loss. Transfusion for hemoglobin <8.0 or signs of active bleeding. Hgb stable post 4units PRBCs. Trending Hgb q12hr. Transfusing 1u PRBCs today. 6. Acute Encephalopathy: Multifactorial given shock, hypoxia, blood loss, and baseline dementia. 7. Hyperkalemia:  Resolved.  8. Hyperlipidemia:  Continuing home Lipitor. 9. Coronary Artery Disease: Monitor patient on telemetry.  Holding home Cozaar but continuing Lipitor. 10. Essential Hypertension:  Holding home Cozaar given recent hypotension. 11. Neuropathy:  Continuing home Neurontin.  12. Bradycardia:  Likely   secondary to Precedex infusion. Precedex discontinued. Continuing to monitor the patient on telemetry. 13. GERD:  Protonix IV daily. Holding home Zantac. 14. Dementia:  Continuing home Aricept. 15. Diet: Patient has baseline dysphagia. Dietary consult for initiating tube feedings. 16. Prophylaxis:  Protonix IV qhs & SCDs.  I have spent a total of 31 minutes of critical care time today caring for the patient, discussing plan of care with vascular surgery, and reviewing the patient's electronic medical record.   Sonia Baller Ashok Cordia, M.D. [facility] Pulmonary & Critical Care Pager:  [phone] After 3pm or if no response, call [phone] 10/29/2016 8:37 AM     e

## 2016-10-29 NOTE — Progress Notes (Signed)
Returned patient to full support for the night, will resume weaning in the morning.

## 2016-10-29 NOTE — Progress Notes (Addendum)
Initial Nutrition Assessment  DOCUMENTATION CODES:   Not applicable  INTERVENTION:    Initiate Vital AF 1.2 at 20 ml/hr and increase by 10 ml every 8 hours to goal rate of 60 ml/hr   Prostat liquid protein 30 ml BID   TF regimen to provide 1928 kcals, 138 gm protein, 1168 ml free water daily  NUTRITION DIAGNOSIS:   Inadequate oral intake related to inability to eat as evidenced by NPO status  GOAL:   Patient will meet greater than or equal to 90% of their needs  MONITOR:   Vent status, TF tolerance, Labs, Weight trends, I & O's  REASON FOR ASSESSMENT:   Consult Enteral/tube feeding initiation and management  ASSESSMENT:   83 year old male with PMH of vascular disease including AAA (s/p several procedures at [facility] in past), CAD, GERD, dementia, and atrial fibrillation. He presented to [facility] ED 1/23 with complaints of left sided back pain radiating to pelvis, which had been progressive for the past week. BP labile in ED and based on his history he was sent for CT angiogram to assess for AAA. CT demonstrated large AAA with endoleak and rupture. Initially his surgical team at [facility] was called for transfer, however, he then became more hypotensive and lethargic requiring intubation and was transferred to [facility] for emergent vascular surgery evaluation.  Patient is currently intubated on ventilator support >> OGT in place MV: 8.3 L/min Temp (24hrs), Avg:99.7 F (37.6 C), Min:99.1 F (37.3 C), Max:100.6 F (38.1 C)  Pt s/p emergent embolization of R renal artery and repair of ruptured AAA 1/23. With acute hypoxic respiratory failure >> daily SBT & PS weaning. In acute renal failure >> likely given R renal infarction. Labs and medications reviewed. Pt with baseline dysphagia.  Nutrition focused physical exam completed.  No muscle or subcutaneous fat depletion noticed.  Diet Order:  Diet NPO time specified  Skin:  Reviewed, no issues  Last BM:  PTA  Height:    Ht Readings from Last 1 Encounters:  10/29/16 5\' 10"  (1.778 m)    Weight:   Wt Readings from Last 1 Encounters:  10/29/16 202 lb (91.6 kg)    Ideal Body Weight:  75.4 kg  BMI:  Body mass index is 28.98 kg/m.  Estimated Nutritional Needs:   Kcal:  1937  Protein:  135-145 gm  Fluid:  per MD  EDUCATION NEEDS:   No education needs identified at this time  Arthur Holms, RD, LDN Pager #: [phone] After-Hours Pager #: [phone]

## 2016-10-29 NOTE — Progress Notes (Signed)
Subjective  - POD #2  Remains intubated and sedated   Physical Exam:  Right groin site soft Doppler pedal pulses abd soft Opens eyes to command       Assessment/Plan:  POD #2  Pulm:  Did not tolerate weaning yesterday.  Yellow secretions.  Check CXR.  Extubate when ready ABL:  Hb 7 this am with CVP 4.  Will transfuse 1 U prbc GU:  Cr contineus to rise.  Foley was able to be placed yesterday.  35cc UOP last hour.  continue to monitor Prophylaxis:  SCD'x  Will start Lovenox.  Continue protonix GI:  OG will min to mod output.  Keep NPO for now.  Hopefully can start enteral feeds soon  Alonah Lineback, [place] 10/29/2016 7:59 AM --  Vitals:   10/29/16 0726 10/29/16 0743  BP:    Pulse: 73   Resp: 19   Temp:  99.3 F (37.4 C)    Intake/Output Summary (Last 24 hours) at 10/29/16 0759 Last data filed at 10/29/16 0700  Gross per 24 hour  Intake          2345.91 ml  Output              500 ml  Net          1845.91 ml     Laboratory CBC    Component Value Date/Time   WBC 11.7 (H) 10/29/2016 0407   HGB 7.5 (L) 10/29/2016 0407   HCT 22.6 (L) 10/29/2016 0407   HCT 42 04/20/2014   PLT 203 10/29/2016 0407    BMET    Component Value Date/Time   NA 136 10/29/2016 0410   NA 139 08/23/2014   K 4.3 10/29/2016 0410   CL 112 (H) 10/29/2016 0410   CO2 20 (L) 10/29/2016 0410   GLUCOSE 135 (H) 10/29/2016 0410   BUN 35 (H) 10/29/2016 0410   BUN 28 (A) 08/23/2014   CREATININE 2.86 (H) 10/29/2016 0410   CALCIUM 7.7 (L) 10/29/2016 0410   CALCIUM 9.4 04/20/2014   GFRNONAA 18 (L) 10/29/2016 0410   GFRAA 21 (L) 10/29/2016 0410    COAG Lab Results  Component Value Date   INR 1.97 10-30-2016   INR 1.77 October 30, 2016   INR 1.47 10/29/2012   No results found for: PTT  Antibiotics Anti-infectives    Start     Dose/Rate Route Frequency Ordered Stop   10/28/16 2100  cefUROXime (ZINACEF) 1.5 g in dextrose 5 % 50 mL IVPB     1.5 g 100 mL/hr over 30 Minutes Intravenous Every  12 hours 10/28/16 1200 10/29/16 2059   2016/10/30 2030  cefUROXime (ZINACEF) 1.5 g in dextrose 5 % 50 mL IVPB     1.5 g 100 mL/hr over 30 Minutes Intravenous Every 12 hours Oct 30, 2016 1705 10/28/16 1014   Oct 30, 2016 1430  ceFAZolin (ANCEF) IVPB 2 g/50 mL premix  Status:  Discontinued     2 g 100 mL/hr over 30 Minutes Intravenous  Once Oct 30, 2016 1423 October 30, 2016 1705       V. Charlena Cross, M.D. [facility] [place] Office: [phone] Pager:  [phone]

## 2016-10-30 DIAGNOSIS — I713 Abdominal aortic aneurysm, ruptured: Secondary | ICD-10-CM

## 2016-10-30 LAB — CBC WITH DIFFERENTIAL/PLATELET
BASOS ABS: 0 10*3/uL (ref 0.0–0.1)
BASOS PCT: 0 %
EOS ABS: 0.1 10*3/uL (ref 0.0–0.7)
EOS PCT: 1 %
HCT: 24.4 % — ABNORMAL LOW (ref 39.0–52.0)
HEMOGLOBIN: 8.1 g/dL — AB (ref 13.0–17.0)
Lymphocytes Relative: 10 %
Lymphs Abs: 1.1 10*3/uL (ref 0.7–4.0)
MCH: 30.5 pg (ref 26.0–34.0)
MCHC: 33.2 g/dL (ref 30.0–36.0)
MCV: 91.7 fL (ref 78.0–100.0)
Monocytes Absolute: 0.9 10*3/uL (ref 0.1–1.0)
Monocytes Relative: 8 %
Neutro Abs: 8.9 10*3/uL — ABNORMAL HIGH (ref 1.7–7.7)
Neutrophils Relative %: 81 %
PLATELETS: 195 10*3/uL (ref 150–400)
RBC: 2.66 MIL/uL — AB (ref 4.22–5.81)
RDW: 15.6 % — ABNORMAL HIGH (ref 11.5–15.5)
WBC: 11 10*3/uL — AB (ref 4.0–10.5)

## 2016-10-30 LAB — RENAL FUNCTION PANEL
Albumin: 2.2 g/dL — ABNORMAL LOW (ref 3.5–5.0)
Anion gap: 6 (ref 5–15)
BUN: 35 mg/dL — AB (ref 6–20)
CALCIUM: 7.8 mg/dL — AB (ref 8.9–10.3)
CHLORIDE: 111 mmol/L (ref 101–111)
CO2: 19 mmol/L — AB (ref 22–32)
CREATININE: 2.73 mg/dL — AB (ref 0.61–1.24)
GFR, EST AFRICAN AMERICAN: 22 mL/min — AB (ref >60)
GFR, EST NON AFRICAN AMERICAN: 19 mL/min — AB (ref >60)
Glucose, Bld: 112 mg/dL — ABNORMAL HIGH (ref 65–99)
Phosphorus: 2.7 mg/dL (ref 2.5–4.6)
Potassium: 4.6 mmol/L (ref 3.5–5.1)
SODIUM: 136 mmol/L (ref 135–145)

## 2016-10-30 LAB — GLUCOSE, CAPILLARY
GLUCOSE-CAPILLARY: 94 mg/dL (ref 65–99)
GLUCOSE-CAPILLARY: 154 mg/dL — AB (ref 65–99)
Glucose-Capillary: 97 mg/dL (ref 65–99)
Glucose-Capillary: 79 mg/dL (ref 65–99)
Glucose-Capillary: 117 mg/dL — ABNORMAL HIGH (ref 65–99)

## 2016-10-30 LAB — MAGNESIUM: MAGNESIUM: 2 mg/dL (ref 1.7–2.4)

## 2016-10-30 LAB — HEMOGLOBIN AND HEMATOCRIT, BLOOD
HEMATOCRIT: 22.8 % — AB (ref 39.0–52.0)
HEMOGLOBIN: 7.7 g/dL — AB (ref 13.0–17.0)

## 2016-10-30 MED ORDER — DOCUSATE SODIUM 50 MG/5ML PO LIQD
100.0000 mg | Freq: Every day | ORAL | Status: DC
  Administered 2016-10-30 – 2016-11-02 (×4): 100 mg
  Filled 2016-10-30 (×4): qty 10

## 2016-10-30 MED ORDER — ASPIRIN 325 MG PO TABS
325.0000 mg | ORAL_TABLET | Freq: Every day | ORAL | Status: DC
  Administered 2016-10-30 – 2016-11-02 (×4): 325 mg
  Filled 2016-10-30 (×4): qty 1

## 2016-10-30 NOTE — Progress Notes (Signed)
PULMONARY / CRITICAL CARE MEDICINE   Name: Miguel Swanson MRN: [id number] DOB: 01/19/1926    ADMISSION DATE:  10/24/2016 CONSULTATION DATE: 11/01/2016   REFERRING MD: Harold Barban, M.D. / Vascular Surgery  CHIEF COMPLAINT: Ruptured R renal artery stent    Brief:   83 year old male with PMH as below, which is significant for vascular disease including AAA (s/p several procedures at [facility] in past), CAD, GERD, dementia, and atrial fibrillation. He presented to [facility] ED 1/23 with complaints of left sided back pain radiating to pelvis, which had been progressive for the past week. BP labile in ED and based on his history he was sent for CT angiogram to assess for AAA. CT demonstrated large AAA with endoleak and rupture. Initially his surgical team at [facility] was called for transfer, however, he then became more hypotensive and lethargic requiring intubation and was transferred to [facility] for emergent vascular surgery evaluation. He was taken to the OR under Dr. Trula Slade for coil embolization of R renal artery and repair of ruptured aortic aneurysm. The procedure was without complication and he was sent to the PACU and eventually ICU on ventilator.  SUBJECTIVE:  Currently off pressors and sedation. Weaning on vent.   VITAL SIGNS: BP (!) 110/54   Pulse 79   Temp 100.3 F (37.9 C) (Axillary)   Resp (!) 22   Ht 5\' 10"  (1.778 m)   Wt 79.3 kg (174 lb 13.2 oz)   SpO2 99%   BMI 25.08 kg/m   HEMODYNAMICS: CVP:  [4 mmHg-9 mmHg] 6 mmHg  VENTILATOR SETTINGS: Vent Mode: CPAP;PSV FiO2 (%):  [40 %] 40 % Set Rate:  [12 bmp] 12 bmp Vt Set:  [580 mL] 580 mL PEEP:  [5 cmH20] 5 cmH20 Pressure Support:  [10 cmH20] 10 cmH20 Plateau Pressure:  [12 cmH20-17 cmH20] 12 cmH20  INTAKE / OUTPUT: I/O last 3 completed shifts: In: 3961.6 [I.V.:3245.6; Blood:326; NG/GT:290; IV Piggyback:100] Out: [id number] [Urine:1085; Emesis/NG output:50]  PHYSICAL EXAMINATION: General: Elderly male, no distress, lying in  bed   Neuro: Alert, follows commands, moves extremities  HEENT: ETT in place, normocephalic  Cardiovascular: RRR, no MRG, NI S1/S2  Lungs: CTA, non-labored  Abdomen: non-distended, tender, active bowel sounds    Musculoskeletal: no deformities  Skin: warm, dry, intact    LABS:  BMET  Recent Labs Lab 10/28/16 0145 10/29/16 0410 10/30/16 0403  NA 138 136 136  K 4.2 4.3 4.6  CL 108 112* 111  CO2 20* 20* 19*  BUN 35* 35* 35*  CREATININE 2.00* 2.86* 2.73*  GLUCOSE 116* 135* 112*    Electrolytes  Recent Labs Lab 10/28/16 0145 10/29/16 0407 10/29/16 0410 10/30/16 0403  CALCIUM 8.0*  --  7.7* 7.8*  MG 2.0 2.1  --  2.0  PHOS 2.5  --  3.2 2.7    CBC  Recent Labs Lab 10/28/16 0140  10/29/16 0407 10/29/16 1700 10/30/16 0403  WBC 11.4*  --  11.7*  --  11.0*  HGB 8.0*  < > 7.5* 8.2* 8.1*  HCT 23.9*  < > 22.6* 24.8* 24.4*  PLT 195  --  203  --  195  < > = values in this interval not displayed.  Coag's  Recent Labs Lab 10/17/2016 1047 10/08/2016 1751  APTT  --  37*  INR 1.77 1.97    Sepsis Markers  Recent Labs Lab 10/28/16 0028  LATICACIDVEN 1.0    ABG  Recent Labs Lab 10/24/2016 1427 10/21/2016 1853  PHART  7.388 7.344*  PCO2ART 36.0 38.3  PO2ART 84.0 256.0*    Liver Enzymes  Recent Labs Lab 11/01/2016 1047 10/28/16 0145 10/29/16 0410 10/30/16 0403  AST 18 14*  --   --   ALT 14* 10*  --   --   ALKPHOS 88 59  --   --   BILITOT 2.0* 1.0  --   --   ALBUMIN 3.7 2.9* 2.5* 2.2*    Cardiac Enzymes  Recent Labs Lab 10/18/2016 2109 10/28/16 1214  TROPONINI 0.06* 0.08*    Glucose  Recent Labs Lab 10/29/16 1536 10/29/16 1919 10/30/16 0001 10/30/16 0344 10/30/16 0727  GLUCAP 102* 96 117* 97 79    Imaging No results found.   STUDIES:  CTA CHEST/ABD/PELVIS 1/23: Abdominal aortic aneurysm rupture with retroperitoneal hemorrhage. Aortic aneurysm sac within the abdomen massively enlarged and measuring up to 15 cm secondary to large  endoleak likely due to broken right renal artery stents. Delayed filling of the right kidney secondary to complications with right renal artery stents. Small left pleural effusion. Small pulmonary nodules without change since 2011 poorly characterized. Port CXR 1/24:  Previously reviewed by me. Minimal silhouetting left hemidiaphragm.Endotracheal tube and enteric feedings to in good position. Right internal jugular central venous catheter in good position.  CULTURES: MRSA 1/23:  Negative   ANTIBIOTICS: Cefuroxime (periop prophylaxis)  SIGNIFICANT EVENTS: 1/23 - AAA rupture, to OR, then ICU on vent  LINES/TUBES: OETT 7.5 1/23 >>> RIJ CVL 1/23 >>> L Rad Art Line 1/23 >>> OGT 1/23 >>>  ASSESSMENT / PLAN:  PULMONARY A Acute Hypoxic Respiratory Failure P:   Vent Support Wean as tolerated - ready for extubation today (will verify with family DNI after extubation)   Follow CXR Maintain Saturation >92  CARDIOVASCULAR A:  Abdominal Aortic Aneurysm Rupture Bradycardia - improved  H/O HLD, CAD, HTN P:  Vascular following  Cardiac Monitoring  Maintain MAP > 65, currently off pressors  Continue Lipitor, ASA   RENAL A:  Acute Renal Failure - improving  Hyperkalemia - resolving  P:   Monitor urinary output  Trend BMP Replace electrolytes as needed   GASTROINTESTINAL A:   GERD Dysphagia  P:   PPI Speech following   HEMATOLOGIC A:   Anemia secondary to acute blood loss  P:  Transfusion for hemoglobin less then 8 Trend CBC  Monitor for S&S of bleeding   INFECTIOUS A:   No issues  P:   Trend WBC and Fever curve   ENDOCRINE A:   No issues  P:   Trend glucose on BMP   NEUROLOGIC A:   Acute Encephalopathy secondary to shock  H/O dementia, neuropathy   P:   Continue Neurontin  Continue home Aricept  RASS goal: 0    FAMILY  - Updates: Will meet with family today to discuss possibility of one way extubation   - Inter-disciplinary family meet or  Palliative Care meeting due by: ongoing       Hayden Pedro, AG-ACNP [facility]  Pgr: [phone]  PCCM Pgr: [phone]

## 2016-10-30 NOTE — Progress Notes (Signed)
Progress Note    10/30/2016 9:02 AM 3 Days Post-Op  Subjective:  intubated  Tm 100.3 (this am) HR 70's-80's NSR [id number] systolic [id number] .[id number]   Gtts: Fentanyl   Vitals:   10/30/16 0728 10/30/16 0730  BP:    Pulse: 79   Resp: (!) 22   Temp:  100.3 F (37.9 C)    Physical Exam: Cardiac:  regular Lungs:  intubated Incisions:  Bilateral groins are soft without hematoma Extremities:  +palpable right DP and +doppler signal left DP Abdomen:  Soft to palpation Neuro:  Somewhat alert this am; trying to sit up; wiggles toes on commands and moving all extremities.   CBC    Component Value Date/Time   WBC 11.0 (H) 10/30/2016 0403   RBC 2.66 (L) 10/30/2016 0403   HGB 8.1 (L) 10/30/2016 0403   HCT 24.4 (L) 10/30/2016 0403   HCT 42 04/20/2014   PLT 195 10/30/2016 0403   MCV 91.7 10/30/2016 0403   MCV 95.0 04/20/2014   MCH 30.5 10/30/2016 0403   MCHC 33.2 10/30/2016 0403   RDW 15.6 (H) 10/30/2016 0403   RDW 13.7 04/20/2014   LYMPHSABS 1.1 10/30/2016 0403   MONOABS 0.9 10/30/2016 0403   EOSABS 0.1 10/30/2016 0403   EOSABS 0 04/20/2014   BASOSABS 0.0 10/30/2016 0403    BMET    Component Value Date/Time   NA 136 10/30/2016 0403   NA 139 08/23/2014   K 4.6 10/30/2016 0403   CL 111 10/30/2016 0403   CO2 19 (L) 10/30/2016 0403   GLUCOSE 112 (H) 10/30/2016 0403   BUN 35 (H) 10/30/2016 0403   BUN 28 (A) 08/23/2014   CREATININE 2.73 (H) 10/30/2016 0403   CALCIUM 7.8 (L) 10/30/2016 0403   CALCIUM 9.4 04/20/2014   GFRNONAA 19 (L) 10/30/2016 0403   GFRAA 22 (L) 10/30/2016 0403    INR    Component Value Date/Time   INR 1.97 10/24/2016 1751     Intake/Output Summary (Last 24 hours) at 10/30/16 0902 Last data filed at 10/30/16 0700  Gross per 24 hour  Intake             2481 ml  Output              960 ml  Net             1521 ml     Assessment:  83 y.o. male is s/p:  Procedure:   #1: Ultrasound guided access, right femoral artery      #2: Abdominal aortogram                         #3: Coil embolization, right renal artery                         #4: Closure device, right groin                         #5: Repair of ruptured abdominal aortic aneurysm  3 Days Post-Op   Plan: -pt intubated but alert this am.  Trying to sit up this morning.  Wiggles his toes on command.  Continue weaning per CCM -DVT prophylaxis:  Lovenox Dr. Ashok Cordia spoke with family yesterday about multisystem organ failure and underlying dementia.  Family feels he would not want to undergo HD, trach, or PEG placement.  Continue support at this time and monitor renal function.   -renal  function some improved this am -acute blood loss anemia is stable -+palpable right DP and +doppler signal left DP -Dr. Trula Slade will be by to see pt this afternoon.  Granddaughter is here this morning (going back to [place], [place] today) and says the rest of family will be here later today.   Leontine Locket, PA-C [facility] [phone] 10/30/2016 9:02 AM  I agree with the above.  Patient remains intubated and sedated sedated.  His ab soft and he has Doppler signals in both feet.  GI: To peter initiated today and the patient appears to be tolerating Blood loss anemia: Patient's hemoglobin remained stable.  He has not required transfusion the last 24 hours.  We'll continue to monitor Pulmonary: CCM is considering extubation tomorrow if he tolerates weaning.  After discussions with the family, he will not be reintubated.  GU: Continues to have adequate urine output.  Creatinine appears to have peaked. Prophylaxis: Lovenox for DVT, Protonix for GI  Wells Brabham

## 2016-10-31 ENCOUNTER — Inpatient Hospital Stay (HOSPITAL_COMMUNITY): Payer: Medicare Other

## 2016-10-31 DIAGNOSIS — I718 Aortic aneurysm of unspecified site, ruptured: Secondary | ICD-10-CM

## 2016-10-31 LAB — GLUCOSE, CAPILLARY
GLUCOSE-CAPILLARY: 47 mg/dL — AB (ref 65–99)
GLUCOSE-CAPILLARY: 102 mg/dL — AB (ref 65–99)
Glucose-Capillary: 118 mg/dL — ABNORMAL HIGH (ref 65–99)
Glucose-Capillary: 72 mg/dL (ref 65–99)
Glucose-Capillary: 112 mg/dL — ABNORMAL HIGH (ref 65–99)
Glucose-Capillary: 84 mg/dL (ref 65–99)
Glucose-Capillary: 78 mg/dL (ref 65–99)

## 2016-10-31 LAB — TYPE AND SCREEN
BLOOD PRODUCT EXPIRATION DATE: 201802192359
BLOOD PRODUCT EXPIRATION DATE: 201802192359
Blood Product Expiration Date: 201802172359
Blood Product Expiration Date: 201802192359
ISSUE DATE / TIME: 201801231952
ISSUE DATE / TIME: 201801232213
ISSUE DATE / TIME: 201801250904
UNIT TYPE AND RH: 5100
UNIT TYPE AND RH: 5100
Unit Type and Rh: 5100
Unit Type and Rh: 5100

## 2016-10-31 LAB — RENAL FUNCTION PANEL
ANION GAP: 5 (ref 5–15)
Albumin: 2.1 g/dL — ABNORMAL LOW (ref 3.5–5.0)
BUN: 36 mg/dL — ABNORMAL HIGH (ref 6–20)
CO2: 19 mmol/L — ABNORMAL LOW (ref 22–32)
Calcium: 7.7 mg/dL — ABNORMAL LOW (ref 8.9–10.3)
Chloride: 110 mmol/L (ref 101–111)
Creatinine, Ser: 2.75 mg/dL — ABNORMAL HIGH (ref 0.61–1.24)
GFR calc Af Amer: 22 mL/min — ABNORMAL LOW (ref >60)
GFR, EST NON AFRICAN AMERICAN: 19 mL/min — AB (ref >60)
GLUCOSE: 125 mg/dL — AB (ref 65–99)
PHOSPHORUS: 2.2 mg/dL — AB (ref 2.5–4.6)
Potassium: 4.5 mmol/L (ref 3.5–5.1)
Sodium: 134 mmol/L — ABNORMAL LOW (ref 135–145)

## 2016-10-31 LAB — CBC WITH DIFFERENTIAL/PLATELET
BASOS PCT: 0 %
Basophils Absolute: 0 10*3/uL (ref 0.0–0.1)
Eosinophils Absolute: 0.2 10*3/uL (ref 0.0–0.7)
Eosinophils Relative: 2 %
HEMATOCRIT: 23.3 % — AB (ref 39.0–52.0)
HEMOGLOBIN: 7.6 g/dL — AB (ref 13.0–17.0)
Lymphocytes Relative: 9 %
Lymphs Abs: 1 10*3/uL (ref 0.7–4.0)
MCH: 30 pg (ref 26.0–34.0)
MCHC: 32.6 g/dL (ref 30.0–36.0)
MCV: 92.1 fL (ref 78.0–100.0)
MONOS PCT: 9 %
Monocytes Absolute: 1 10*3/uL (ref 0.1–1.0)
NEUTROS ABS: 8.8 10*3/uL — AB (ref 1.7–7.7)
NEUTROS PCT: 80 %
Platelets: 201 10*3/uL (ref 150–400)
RBC: 2.53 MIL/uL — ABNORMAL LOW (ref 4.22–5.81)
RDW: 15.1 % (ref 11.5–15.5)
WBC: 11 10*3/uL — ABNORMAL HIGH (ref 4.0–10.5)

## 2016-10-31 LAB — POCT I-STAT 3, ART BLOOD GAS (G3+)
ACID-BASE DEFICIT: 5 mmol/L — AB (ref 0.0–2.0)
Bicarbonate: 19.4 mmol/L — ABNORMAL LOW (ref 20.0–28.0)
O2 Saturation: 98 %
PH ART: 7.367 (ref 7.350–7.450)
TCO2: 20 mmol/L (ref 0–100)
pCO2 arterial: 33.8 mmHg (ref 32.0–48.0)
pO2, Arterial: 106 mmHg (ref 83.0–108.0)

## 2016-10-31 LAB — MAGNESIUM: MAGNESIUM: 2 mg/dL (ref 1.7–2.4)

## 2016-10-31 LAB — HEMOGLOBIN AND HEMATOCRIT, BLOOD
HEMATOCRIT: 22.3 % — AB (ref 39.0–52.0)
HEMOGLOBIN: 7.4 g/dL — AB (ref 13.0–17.0)

## 2016-10-31 NOTE — Progress Notes (Signed)
PULMONARY / CRITICAL CARE MEDICINE   Name: Miguel Swanson MRN: [id number] DOB: [birth year]    ADMISSION DATE:  10/26/2016 CONSULTATION DATE: 11/02/2016   REFERRING MD: Harold Barban, M.D. / Vascular Surgery  CHIEF COMPLAINT: Ruptured R renal artery stent    BRIEF SUMMARY:   83 year old with PMH of vascular disease including AAA (s/p several procedures at [facility] in past), CAD, GERD, dementia, and atrial fibrillation. He presented to [facility] ED 1/23 with complaints of left sided back pain radiating to pelvis, which had been progressive for the past week. BP labile in ED and based on his history he was sent for CT angiogram to assess for AAA. CT demonstrated large AAA with endoleak and rupture. Initially his surgical team at [facility] was called for transfer, however, he then became more hypotensive and lethargic requiring intubation and was transferred to [facility] for emergent vascular surgery evaluation. He was taken to the OR under Dr. Trula Slade for coil embolization of R renal artery and repair of ruptured aortic aneurysm. The procedure was without complication and he was sent to the PACU and eventually ICU on ventilator.  SUBJECTIVE:  RN reports pt weaning on PSV 10/5, pt very HOH.  Remains on fentanyl at 150 mcg/hr.  Increased yellow secretions from ETT. Tmax 99.4.    VITAL SIGNS: BP (!) 107/53   Pulse 73   Temp 99 F (37.2 C) (Oral)   Resp 17   Ht 5\' 10"  (1.778 m)   Wt 176 lb 9.4 oz (80.1 kg)   SpO2 99%   BMI 25.34 kg/m   HEMODYNAMICS: CVP:  [2 mmHg-15 mmHg] 2 mmHg  VENTILATOR SETTINGS: Vent Mode: CPAP;PSV FiO2 (%):  [40 %] 40 % Set Rate:  [12 bmp] 12 bmp Vt Set:  [580 mL] 580 mL PEEP:  [5 cmH20] 5 cmH20 Pressure Support:  [10 cmH20] 10 cmH20 Plateau Pressure:  [12 cmH20-20 cmH20] 17 cmH20  INTAKE / OUTPUT: I/O last 3 completed shifts: In: 6108.8 [I.V.:5068.8; NG/GT:1040] Out: 1570 [Urine:1570]  PHYSICAL EXAMINATION: General: frail elderly male in NAD on vent HEENT: MM  pink/moist, eyes open to voice, ETT Neuro: Awakens to voice, follows simple commands then back to sleep, generalized weakness CV: s1s2 rrr, no m/r/g PULM: non-labored, lungs bilaterally clear [id number], non-tender, bsx4 active  Extremities: warm/dry, no edema  Skin: no rashes or lesions   LABS:  BMET  Recent Labs Lab 10/29/16 0410 10/30/16 0403 10/31/16 0413  NA 136 136 134*  K 4.3 4.6 4.5  CL 112* 111 110  CO2 20* 19* 19*  BUN 35* 35* 36*  CREATININE 2.86* 2.73* 2.75*  GLUCOSE 135* 112* 125*    Electrolytes  Recent Labs Lab 10/29/16 0407 10/29/16 0410 10/30/16 0403 10/31/16 0413  CALCIUM  --  7.7* 7.8* 7.7*  MG 2.1  --  2.0 2.0  PHOS  --  3.2 2.7 2.2*    CBC  Recent Labs Lab 10/29/16 0407  10/30/16 0403 10/30/16 1705 10/31/16 0500  WBC 11.7*  --  11.0*  --  11.0*  HGB 7.5*  < > 8.1* 7.7* 7.6*  HCT 22.6*  < > 24.4* 22.8* 23.3*  PLT 203  --  195  --  201  < > = values in this interval not displayed.  Coag's  Recent Labs Lab 10/15/2016 1047 10/18/2016 1751  APTT  --  37*  INR 1.77 1.97    Sepsis Markers  Recent Labs Lab 10/28/16 0028  LATICACIDVEN 1.0    ABG  Recent Labs Lab 10/29/2016 1427 10/20/2016 1853 10/31/16 0406  PHART 7.388 7.344* 7.367  PCO2ART 36.0 38.3 33.8  PO2ART 84.0 256.0* 106.0    Liver Enzymes  Recent Labs Lab 10/28/2016 1047 10/28/16 0145 10/29/16 0410 10/30/16 0403 10/31/16 0413  AST 18 14*  --   --   --   ALT 14* 10*  --   --   --   ALKPHOS 88 59  --   --   --   BILITOT 2.0* 1.0  --   --   --   ALBUMIN 3.7 2.9* 2.5* 2.2* 2.1*    Cardiac Enzymes  Recent Labs Lab 11/03/2016 2109 10/28/16 1214  TROPONINI 0.06* 0.08*    Glucose  Recent Labs Lab 10/30/16 0727 10/30/16 1122 10/30/16 1537 10/30/16 1943 10/31/16 0909 10/31/16 0912  GLUCAP 79 94 154* 117* 47* 118*    Imaging No results found.   STUDIES:  CTA CHEST/ABD/PELVIS 1/23: Abdominal aortic aneurysm rupture with retroperitoneal  hemorrhage. Aortic aneurysm sac within the abdomen massively enlarged and measuring up to 15 cm secondary to large endoleak likely due to broken right renal artery stents. Delayed filling of the right kidney secondary to complications with right renal artery stents. Small left pleural effusion. Small pulmonary nodules without change since 2011 poorly characterized. Port CXR 1/24:  Previously reviewed by me. Minimal silhouetting left hemidiaphragm.Endotracheal tube and enteric feedings to in good position. Right internal jugular central venous catheter in good position.  CULTURES: MRSA 1/23:  Negative   ANTIBIOTICS: Cefuroxime (periop prophylaxis)  SIGNIFICANT EVENTS: 1/23 - AAA rupture, to OR, then ICU on vent 1/27 - weaning on PSV  LINES/TUBES: OETT 7.5 1/23 >> RIJ CVL 1/23 >> L Rad Art Line 1/23 >> OGT 1/23 >>  ASSESSMENT / PLAN:  PULMONARY A Acute Hypoxic Respiratory Failure  P:   PRVC 8 cc/kg  Wean PEEP / FiO2 for sats > 92% PSV wean as tolerated  Plan for one-way extubation once tolerating SBT Favor holding extubation until we can diurese  Trend CXR   CARDIOVASCULAR A:  Abdominal Aortic Aneurysm Rupture Bradycardia - improved  H/O HLD, CAD, HTN P:  VVS following ICU monitoring  Continue lipitor & ASA Monitor BP trend, off vasopressors DNR   RENAL A:  Acute Renal Failure - improving  Hyperkalemia - resolving  P:   Trend BMP / UOP  Replace electrolytes as indicated  Once sr cr recovered, consider diuresis  GASTROINTESTINAL A:   GERD Dysphagia  P:   PPI for SUP  NPO  TF per nutrition   HEMATOLOGIC A:   Anemia secondary to acute blood loss  P:  Trend CBC Transfuse per ICU guidelines  Monitor for bleeding  INFECTIOUS A:   No acute issues  P:   Monitor WBC, fever curve  ENDOCRINE A:   No issues  P:   Monitor glucose on BMP  NEUROLOGIC A:   Acute Encephalopathy secondary to shock  H/O dementia, neuropathy   P:   RASS Goal: 0   Minimize sedation as able  Fentanyl gtt for pain / sedation     FAMILY  - Updates: No family on am rounds 1/27.    - Inter-disciplinary family meet or Palliative Care meeting due by: ongoing      CC Time: 9 minutes  Noe Gens, NP-C [facility] Pgr: [phone] or if no answer [phone] 10/31/2016, 9:13 AM

## 2016-10-31 NOTE — Progress Notes (Addendum)
Thin tan secretions noted in ETT. Tube feedings paused pending Abdominal film. No changes in O2 saturations. No residuals pulled from OG. Will continue to monitor pt closely

## 2016-10-31 NOTE — Progress Notes (Signed)
Abdominal film shows OG coiled in the stomach. Tube feedings resumed at previous rate of 40/hr. Will attempt to readvance per protocol.  Will continue to monitor pt closely.

## 2016-10-31 NOTE — Progress Notes (Signed)
Progress Note    10/31/2016 1:35 PM 4 Days Post-Op  Subjective:  Intubated, sedated with fentanyl  Vitals:   10/31/16 1100 10/31/16 1150  BP: 108/61   Pulse: 75 81  Resp: 19 (!) 26  Temp:  99.8 F (37.7 C)    Physical Exam: Intubated and sedated Follows commands per bedside nurse Abdomen is soft, does not move to deep palpation Bilateral palpable dp  CBC    Component Value Date/Time   WBC 11.0 (H) 10/31/2016 0500   RBC 2.53 (L) 10/31/2016 0500   HGB 7.6 (L) 10/31/2016 0500   HCT 23.3 (L) 10/31/2016 0500   HCT 42 04/20/2014   PLT 201 10/31/2016 0500   MCV 92.1 10/31/2016 0500   MCV 95.0 04/20/2014   MCH 30.0 10/31/2016 0500   MCHC 32.6 10/31/2016 0500   RDW 15.1 10/31/2016 0500   RDW 13.7 04/20/2014   LYMPHSABS 1.0 10/31/2016 0500   MONOABS 1.0 10/31/2016 0500   EOSABS 0.2 10/31/2016 0500   EOSABS 0 04/20/2014   BASOSABS 0.0 10/31/2016 0500    BMET    Component Value Date/Time   NA 134 (L) 10/31/2016 0413   NA 139 08/23/2014   K 4.5 10/31/2016 0413   CL 110 10/31/2016 0413   CO2 19 (L) 10/31/2016 0413   GLUCOSE 125 (H) 10/31/2016 0413   BUN 36 (H) 10/31/2016 0413   BUN 28 (A) 08/23/2014   CREATININE 2.75 (H) 10/31/2016 0413   CALCIUM 7.7 (L) 10/31/2016 0413   CALCIUM 9.4 04/20/2014   GFRNONAA 19 (L) 10/31/2016 0413   GFRAA 22 (L) 10/31/2016 0413    INR    Component Value Date/Time   INR 1.97 10/26/2016 1751     Intake/Output Summary (Last 24 hours) at 10/31/16 1335 Last data filed at 10/31/16 1100  Gross per 24 hour  Intake           4765.5 ml  Output             1015 ml  Net           3750.5 ml     Assessment:  83 y.o. male is plug of right renal artery for large aneurysm rupture  Plan: -ventilator management per ccm -he is now dnr per earlier discussion with family, nobody at bedside during my exam today -acute blood loss anemia without need for pressors, continue to trend cbc -tube feeds via ng   Gerda Yin C. Randie Heinz,  MD [facility] [place] Office: [phone] Pager: [phone]  10/31/2016 1:35 PM

## 2016-10-31 NOTE — Progress Notes (Addendum)
RN assessed dusky fingers on both hands. Areas blanchable. Pulses palpable. Cap refill >3 seconds. Message left with [facility] MD. Will continue to monitor pt closely.

## 2016-11-01 ENCOUNTER — Inpatient Hospital Stay (HOSPITAL_COMMUNITY): Payer: Medicare Other

## 2016-11-01 DIAGNOSIS — Z978 Presence of other specified devices: Secondary | ICD-10-CM

## 2016-11-01 LAB — RENAL FUNCTION PANEL
ANION GAP: 5 (ref 5–15)
Albumin: 1.9 g/dL — ABNORMAL LOW (ref 3.5–5.0)
BUN: 45 mg/dL — ABNORMAL HIGH (ref 6–20)
CHLORIDE: 110 mmol/L (ref 101–111)
CO2: 18 mmol/L — ABNORMAL LOW (ref 22–32)
Calcium: 7.7 mg/dL — ABNORMAL LOW (ref 8.9–10.3)
Creatinine, Ser: 2.93 mg/dL — ABNORMAL HIGH (ref 0.61–1.24)
GFR, EST AFRICAN AMERICAN: 21 mL/min — AB (ref >60)
GFR, EST NON AFRICAN AMERICAN: 18 mL/min — AB (ref >60)
Glucose, Bld: 123 mg/dL — ABNORMAL HIGH (ref 65–99)
POTASSIUM: 5 mmol/L (ref 3.5–5.1)
Phosphorus: 2 mg/dL — ABNORMAL LOW (ref 2.5–4.6)
Sodium: 133 mmol/L — ABNORMAL LOW (ref 135–145)

## 2016-11-01 LAB — CBC WITH DIFFERENTIAL/PLATELET
BASOS ABS: 0 10*3/uL (ref 0.0–0.1)
Basophils Relative: 0 %
Eosinophils Absolute: 0.2 10*3/uL (ref 0.0–0.7)
Eosinophils Relative: 2 %
HEMATOCRIT: 21.5 % — AB (ref 39.0–52.0)
HEMOGLOBIN: 7.1 g/dL — AB (ref 13.0–17.0)
LYMPHS PCT: 12 %
Lymphs Abs: 1.2 10*3/uL (ref 0.7–4.0)
MCH: 30.5 pg (ref 26.0–34.0)
MCHC: 33 g/dL (ref 30.0–36.0)
MCV: 92.3 fL (ref 78.0–100.0)
MONO ABS: 1.1 10*3/uL — AB (ref 0.1–1.0)
Monocytes Relative: 11 %
NEUTROS ABS: 7.1 10*3/uL (ref 1.7–7.7)
Neutrophils Relative %: 75 %
Platelets: 214 10*3/uL (ref 150–400)
RBC: 2.33 MIL/uL — AB (ref 4.22–5.81)
RDW: 15 % (ref 11.5–15.5)
WBC: 9.5 10*3/uL (ref 4.0–10.5)

## 2016-11-01 LAB — POCT I-STAT 3, ART BLOOD GAS (G3+)
Acid-base deficit: 7 mmol/L — ABNORMAL HIGH (ref 0.0–2.0)
Bicarbonate: 18.5 mmol/L — ABNORMAL LOW (ref 20.0–28.0)
O2 SAT: 97 %
TCO2: 20 mmol/L (ref 0–100)
pCO2 arterial: 35.7 mmHg (ref 32.0–48.0)
pH, Arterial: 7.323 — ABNORMAL LOW (ref 7.350–7.450)
pO2, Arterial: 97 mmHg (ref 83.0–108.0)

## 2016-11-01 LAB — GLUCOSE, CAPILLARY
GLUCOSE-CAPILLARY: 100 mg/dL — AB (ref 65–99)
GLUCOSE-CAPILLARY: 116 mg/dL — AB (ref 65–99)
Glucose-Capillary: 117 mg/dL — ABNORMAL HIGH (ref 65–99)
Glucose-Capillary: 107 mg/dL — ABNORMAL HIGH (ref 65–99)
Glucose-Capillary: 111 mg/dL — ABNORMAL HIGH (ref 65–99)

## 2016-11-01 LAB — PROCALCITONIN: Procalcitonin: 0.58 ng/mL

## 2016-11-01 LAB — PREPARE RBC (CROSSMATCH)

## 2016-11-01 LAB — MAGNESIUM: Magnesium: 2.1 mg/dL (ref 1.7–2.4)

## 2016-11-01 MED ORDER — SODIUM CHLORIDE 0.9 % IV SOLN
Freq: Once | INTRAVENOUS | Status: AC
  Administered 2016-11-01: 19:00:00 via INTRAVENOUS

## 2016-11-01 MED ORDER — FUROSEMIDE 10 MG/ML IJ SOLN
20.0000 mg | Freq: Once | INTRAMUSCULAR | Status: AC
  Administered 2016-11-01: 20 mg via INTRAVENOUS
  Filled 2016-11-01: qty 2

## 2016-11-01 MED ORDER — SODIUM CHLORIDE 0.9 % IV SOLN
INTRAVENOUS | Status: DC
  Administered 2016-11-01 – 2016-11-02 (×2): via INTRAVENOUS

## 2016-11-01 NOTE — Progress Notes (Signed)
PULMONARY / CRITICAL CARE MEDICINE   Name: Miguel Swanson MRN: [id number] DOB: Oct 05, 1926    ADMISSION DATE:  10/15/2016 CONSULTATION DATE: 11/02/2016   REFERRING MD: Harold Barban, M.D. / Vascular Surgery  CHIEF COMPLAINT: Ruptured R renal artery stent    BRIEF SUMMARY:   83 year old with PMH of vascular disease including AAA (s/p several procedures at [facility] in past), CAD, GERD, dementia, and atrial fibrillation. He presented to [facility] ED 1/23 with complaints of left sided back pain radiating to pelvis, which had been progressive for the past week. BP labile in ED and based on his history he was sent for CT angiogram to assess for AAA. CT demonstrated large AAA with endoleak and rupture. Initially his surgical team at [facility] was called for transfer, however, he then became more hypotensive and lethargic requiring intubation and was transferred to [facility] for emergent vascular surgery evaluation. He was taken to the OR under Dr. Trula Slade for coil embolization of R renal artery and repair of ruptured aortic aneurysm. The procedure was without complication and he was sent to the PACU and eventually ICU on ventilator.  SUBJECTIVE:   RN reports pt's sr cr increasing.  Intermittent agitation.   Tmax 100.6.  RN / RT report thick yellow/brown secretions.   Minimal residual of TF (15 cc).   VITAL SIGNS: BP 106/78 (BP Location: Right Arm)   Pulse 79   Temp 99.3 F (37.4 C) (Oral)   Resp (!) 26   Ht 5\' 10"  (1.778 m)   Wt 177 lb 14.6 oz (80.7 kg)   SpO2 100%   BMI 25.53 kg/m   HEMODYNAMICS: CVP:  [2 mmHg-14 mmHg] 6 mmHg  VENTILATOR SETTINGS: Vent Mode: CPAP;PSV FiO2 (%):  [40 %] 40 % Set Rate:  [12 bmp] 12 bmp Vt Set:  [580 mL] 580 mL PEEP:  [5 cmH20] 5 cmH20 Pressure Support:  [10 cmH20] 10 cmH20 Plateau Pressure:  [12 cmH20-15 cmH20] 12 cmH20  INTAKE / OUTPUT: I/O last 3 completed shifts: In: 6987.5 [I.V.:5410; NG/GT:1577.5] Out: 1225 [Urine:1225]  PHYSICAL  EXAMINATION: General: elderly male in NAD on vent  HEENT: MM pink/moist, ETT in place  Neuro: Awake, intermittently will follow simple commands, MAE CV: s1s2 rrr, no m/r/g PULM: non-labored, lungs bilaterally coarse [id number], non-tender, bsx4 active  Extremities: warm/dry, no edema.  L hand last three digits with faint dusky coloration, L radial aline.  Cap refill 3 sec in digits Skin: no rashes or lesions     LABS:  BMET  Recent Labs Lab 10/30/16 0403 10/31/16 0413 11/01/16 0452  NA 136 134* 133*  K 4.6 4.5 5.0  CL 111 110 110  CO2 19* 19* 18*  BUN 35* 36* 45*  CREATININE 2.73* 2.75* 2.93*  GLUCOSE 112* 125* 123*    Electrolytes  Recent Labs Lab 10/30/16 0403 10/31/16 0413 11/01/16 0452  CALCIUM 7.8* 7.7* 7.7*  MG 2.0 2.0 2.1  PHOS 2.7 2.2* 2.0*    CBC  Recent Labs Lab 10/30/16 0403  10/31/16 0500 10/31/16 1630 11/01/16 0453  WBC 11.0*  --  11.0*  --  9.5  HGB 8.1*  < > 7.6* 7.4* 7.1*  HCT 24.4*  < > 23.3* 22.3* 21.5*  PLT 195  --  201  --  214  < > = values in this interval not displayed.  Coag's  Recent Labs Lab 10/21/2016 1047 10/23/2016 1751  APTT  --  37*  INR 1.77 1.97    Sepsis Markers  Recent  Labs Lab 10/28/16 0028  LATICACIDVEN 1.0    ABG  Recent Labs Lab 10/11/2016 1853 10/31/16 0406 11/01/16 0431  PHART 7.344* 7.367 7.323*  PCO2ART 38.3 33.8 35.7  PO2ART 256.0* 106.0 97.0    Liver Enzymes  Recent Labs Lab 10/06/2016 1047 10/28/16 0145  10/30/16 0403 10/31/16 0413 11/01/16 0452  AST 18 14*  --   --   --   --   ALT 14* 10*  --   --   --   --   ALKPHOS 88 59  --   --   --   --   BILITOT 2.0* 1.0  --   --   --   --   ALBUMIN 3.7 2.9*  < > 2.2* 2.1* 1.9*  < > = values in this interval not displayed.  Cardiac Enzymes  Recent Labs Lab 10/19/2016 2109 10/28/16 1214  TROPONINI 0.06* 0.08*    Glucose  Recent Labs Lab 10/31/16 1227 10/31/16 1231 10/31/16 1627 10/31/16 1938 10/31/16 2325 11/01/16 0428   GLUCAP 72 112* 102* 84 78 116*    Imaging Dg Chest Port 1 View  Result Date: 11/01/2016 CLINICAL DATA:  Acute respiratory failure with hypoxia EXAM: PORTABLE CHEST 1 VIEW COMPARISON:  10/28/2016 FINDINGS: Cardiomegaly with vascular congestion and bilateral perihilar and lower lobe opacities, worsening since prior study. Suspect small effusions. Support devices are unchanged. IMPRESSION: Worsening bilateral perihilar and lower lobe opacities, likely edema. Electronically Signed   By: Rolm Baptise M.D.   On: 11/01/2016 07:49   Dg Abd Portable 1v  Result Date: 10/31/2016 CLINICAL DATA:  Tube placement EXAM: PORTABLE ABDOMEN - 1 VIEW COMPARISON:  11/03/2016 FINDINGS: NG tube coils in the stomach. Stent graft noted in the abdominal aorta. Nonobstructive bowel gas pattern. No free air. IMPRESSION: NG tube coils in the stomach. Electronically Signed   By: Rolm Baptise M.D.   On: 10/31/2016 12:17     STUDIES:  CTA CHEST/ABD/PELVIS 1/23: Abdominal aortic aneurysm rupture with retroperitoneal hemorrhage. Aortic aneurysm sac within the abdomen massively enlarged and measuring up to 15 cm secondary to large endoleak likely due to broken right renal artery stents. Delayed filling of the right kidney secondary to complications with right renal artery stents. Small left pleural effusion. Small pulmonary nodules without change since 2011 poorly characterized. Port CXR 1/24:  Previously reviewed by me. Minimal silhouetting left hemidiaphragm.Endotracheal tube and enteric feedings to in good position. Right internal jugular central venous catheter in good position.  CULTURES: MRSA 1/23:  Negative   ANTIBIOTICS: Cefuroxime (periop prophylaxis)  SIGNIFICANT EVENTS: 1/23 - AAA rupture, to OR, then ICU on vent 1/27 - weaning on PSV 1/28 - weaning on PSV, d/c aline   LINES/TUBES: OETT 7.5 1/23 >> RIJ CVL 1/23 >> L Rad Art Line 1/23 >> 1/28 OGT 1/23 >>  ASSESSMENT / PLAN:  PULMONARY A Acute Hypoxic  Respiratory Failure  Bibasilar Opacities - likely effusions with compressive atx P:   PRVC 8 cc/kg  Wean PEEP / FiO2 for sats > 92% PSV as tolerated  Plan is for one way extubation  Favor holding extubation until pt can be diuresed > sr cr rising.   Trend CXR  CARDIOVASCULAR A:  Abdominal Aortic Aneurysm Rupture Bradycardia - improved  H/O HLD, CAD, HTN P:  Post op care per VVS ICU monitoring  Continue lipitor / ASA Monitor BP off vasopressors  DNR  RENAL A:  Acute Renal Failure - improving  Hyperkalemia - resolving  P:   Trend BMP /  UOP  Replace electrolytes as indicated  Trial low dose lasix, 20 mg IV x1  Has HD cath in place, may need renal consult.    D/C K from IVF NS @ 40 ml/hr  GASTROINTESTINAL A:   GERD Dysphagia  P:   NPO  TF per nutrition  PPI for SUP  Assess KUB to ensure proper positioning of feeding tube Hold TF x4 hours and monitor secretions  HEMATOLOGIC A:   Anemia secondary to acute blood loss  P:  Trend CBC  Transfuse per ICU guidelines  INFECTIOUS A:   Increased pulmonary secretions / low grade temp P:   Monitor fever curve / WBC trend  Hold TF for 4 hours and observe secretions Consider empiric abx if fever > 101.5  ENDOCRINE A:   No issues  P:   Trend glucose on BMP   NEUROLOGIC A:   Acute Encephalopathy secondary to shock  H/O dementia, neuropathy   P:   RASS Goal: 0  Minimize sedation as able with renal failure  Fentanyl gtt for pain / sedation    FAMILY  - Updates: No family on am rounds 1/27.  Plan of care discussed with RN.   - Inter-disciplinary family meet or Palliative Care meeting due by: ongoing      CC Time:  9 minutes   Noe Gens, NP-C [facility] Pgr: [phone] or if no answer [phone] 11/01/2016, 8:53 AM

## 2016-11-01 NOTE — Progress Notes (Signed)
eLink Physician-Brief Progress Note Patient Name: Miguel Swanson DOB: 1926/06/27 MRN: [id number]   Date of Service  11/01/2016  HPI/Events of Note  Low grade fever, ? Ok to start transfusion  eICU Interventions  Ok to start transfusion, hold abx for def spike over baseline, check sputum culture     Intervention Category Major Interventions: Infection - evaluation and management  Christinia Gully 11/01/2016, 6:07 PM

## 2016-11-01 NOTE — Progress Notes (Signed)
CCM NP called to make aware of increased secretions from ETT. Very creamy color with a hit of yellow. Question if it could have been tube feeds. OG placement checked and heard in the belly pt has not vomited. Will get xray to make sure. Tube feed held for 4 hrs per NP. Also notified of tmax 101 this am. NP will place orders. Will cont to monitor and assess.

## 2016-11-01 NOTE — Progress Notes (Signed)
ETT holder changed without complication. Assigned RN at bedside to assist. RT will continue to monitor.

## 2016-11-01 NOTE — Progress Notes (Signed)
Progress Note    11/01/2016 11:40 AM 5 Days Post-Op  Subjective:  Remains intubated and sedated  Vitals:   11/01/16 0800 11/01/16 0900  BP: 106/78 (!) 109/51  Pulse: 79 85  Resp: (!) 26 20  Temp:  (!) 101.1 F (38.4 C)    Physical Exam: Intubated and sedated Abdomen is soft Bilateral palpable dp   CBC    Component Value Date/Time   WBC 9.5 11/01/2016 0453   RBC 2.33 (L) 11/01/2016 0453   HGB 7.1 (L) 11/01/2016 0453   HCT 21.5 (L) 11/01/2016 0453   HCT 42 04/20/2014   PLT 214 11/01/2016 0453   MCV 92.3 11/01/2016 0453   MCV 95.0 04/20/2014   MCH 30.5 11/01/2016 0453   MCHC 33.0 11/01/2016 0453   RDW 15.0 11/01/2016 0453   RDW 13.7 04/20/2014   LYMPHSABS 1.2 11/01/2016 0453   MONOABS 1.1 (H) 11/01/2016 0453   EOSABS 0.2 11/01/2016 0453   EOSABS 0 04/20/2014   BASOSABS 0.0 11/01/2016 0453    BMET    Component Value Date/Time   NA 133 (L) 11/01/2016 0452   NA 139 08/23/2014   K 5.0 11/01/2016 0452   CL 110 11/01/2016 0452   CO2 18 (L) 11/01/2016 0452   GLUCOSE 123 (H) 11/01/2016 0452   BUN 45 (H) 11/01/2016 0452   BUN 28 (A) 08/23/2014   CREATININE 2.93 (H) 11/01/2016 0452   CALCIUM 7.7 (L) 11/01/2016 0452   CALCIUM 9.4 04/20/2014   GFRNONAA 18 (L) 11/01/2016 0452   GFRAA 21 (L) 11/01/2016 0452    INR    Component Value Date/Time   INR 1.97 2016-11-22 1751     Intake/Output Summary (Last 24 hours) at 11/01/16 1140 Last data filed at 11/01/16 0800  Gross per 24 hour  Intake             3072 ml  Output              550 ml  Net             2522 ml     Assessment:  83 y.o. male is plug of right renal artery for rupture of AAA  Plan: -ventilator management per ccm -patient is dnr per earlier discussion  -acute blood loss anemia without pressor requirement, may benefit from transfusion -Cr continue to increase    Lourdez Mcgahan C. Randie Heinz, MD [facility] Office: [phone] Pager:  [phone]  11/01/2016 11:40 AM

## 2016-11-02 ENCOUNTER — Inpatient Hospital Stay (HOSPITAL_COMMUNITY): Payer: Medicare Other

## 2016-11-02 ENCOUNTER — Encounter (HOSPITAL_COMMUNITY): Payer: Self-pay | Admitting: Surgery

## 2016-11-02 DIAGNOSIS — Z9289 Personal history of other medical treatment: Secondary | ICD-10-CM

## 2016-11-02 DIAGNOSIS — Z8679 Personal history of other diseases of the circulatory system: Secondary | ICD-10-CM

## 2016-11-02 DIAGNOSIS — Z9889 Other specified postprocedural states: Secondary | ICD-10-CM

## 2016-11-02 DIAGNOSIS — R52 Pain, unspecified: Secondary | ICD-10-CM

## 2016-11-02 LAB — RENAL FUNCTION PANEL
Albumin: 2 g/dL — ABNORMAL LOW (ref 3.5–5.0)
Anion gap: 5 (ref 5–15)
BUN: 57 mg/dL — ABNORMAL HIGH (ref 6–20)
CHLORIDE: 108 mmol/L (ref 101–111)
CO2: 20 mmol/L — AB (ref 22–32)
CREATININE: 3.21 mg/dL — AB (ref 0.61–1.24)
Calcium: 8 mg/dL — ABNORMAL LOW (ref 8.9–10.3)
GFR calc non Af Amer: 16 mL/min — ABNORMAL LOW (ref >60)
GFR, EST AFRICAN AMERICAN: 18 mL/min — AB (ref >60)
GLUCOSE: 120 mg/dL — AB (ref 65–99)
Phosphorus: 2.4 mg/dL — ABNORMAL LOW (ref 2.5–4.6)
Potassium: 5.1 mmol/L (ref 3.5–5.1)
Sodium: 133 mmol/L — ABNORMAL LOW (ref 135–145)

## 2016-11-02 LAB — TYPE AND SCREEN
BLOOD PRODUCT EXPIRATION DATE: 201802212359
ISSUE DATE / TIME: 201801281815
UNIT TYPE AND RH: 5100

## 2016-11-02 LAB — GLUCOSE, CAPILLARY
GLUCOSE-CAPILLARY: 136 mg/dL — AB (ref 65–99)
GLUCOSE-CAPILLARY: 98 mg/dL (ref 65–99)
Glucose-Capillary: 103 mg/dL — ABNORMAL HIGH (ref 65–99)
Glucose-Capillary: 114 mg/dL — ABNORMAL HIGH (ref 65–99)
Glucose-Capillary: 114 mg/dL — ABNORMAL HIGH (ref 65–99)
Glucose-Capillary: 132 mg/dL — ABNORMAL HIGH (ref 65–99)
Glucose-Capillary: 107 mg/dL — ABNORMAL HIGH (ref 65–99)
Glucose-Capillary: 126 mg/dL — ABNORMAL HIGH (ref 65–99)

## 2016-11-02 LAB — CBC
HCT: 24.9 % — ABNORMAL LOW (ref 39.0–52.0)
HEMOGLOBIN: 8.4 g/dL — AB (ref 13.0–17.0)
MCH: 30.7 pg (ref 26.0–34.0)
MCHC: 33.7 g/dL (ref 30.0–36.0)
MCV: 90.9 fL (ref 78.0–100.0)
Platelets: 245 10*3/uL (ref 150–400)
RBC: 2.74 MIL/uL — ABNORMAL LOW (ref 4.22–5.81)
RDW: 15.4 % (ref 11.5–15.5)
WBC: 13.5 10*3/uL — ABNORMAL HIGH (ref 4.0–10.5)

## 2016-11-02 LAB — MAGNESIUM: MAGNESIUM: 2.1 mg/dL (ref 1.7–2.4)

## 2016-11-02 LAB — PROCALCITONIN: Procalcitonin: 0.8 ng/mL

## 2016-11-02 MED ORDER — HEPARIN SODIUM (PORCINE) 5000 UNIT/ML IJ SOLN
5000.0000 [IU] | Freq: Three times a day (TID) | INTRAMUSCULAR | Status: DC
  Administered 2016-11-02 (×2): 5000 [IU] via SUBCUTANEOUS
  Filled 2016-11-02 (×2): qty 1

## 2016-11-02 NOTE — Progress Notes (Signed)
PCCM Attending Note: Called to bedside regarding self extubation. Patient's respiratory rate fluctuating from the 20s to 30s on nonrebreather mask. Has a weak cough. Denies any pain. Voice quality is moderate. Discussion with patient's son via phone explaining my worry of reintubation only to use extubate the patient in the next 12-24 hours may be traumatic for the patient. Patient's daughter has just now arrived from [place]. They're going to discuss their wishes and then come to bedside to be with her father. Attempting CPAP therapy for respiratory support while awaiting family.   Sonia Baller Ashok Cordia, M.D. [facility] Pager:  [phone] After 3pm or if no response, call [phone] 11:23 PM 11/02/16

## 2016-11-02 NOTE — Progress Notes (Signed)
PULMONARY / CRITICAL CARE MEDICINE   Name: Miguel Swanson MRN: [id number] DOB: [birth year]    ADMISSION DATE:  10/12/2016 CONSULTATION DATE: 10/16/2016   REFERRING MD: Harold Barban, M.D. / Vascular Surgery  CHIEF COMPLAINT: Ruptured R renal artery stent    BRIEF SUMMARY:   83 year old with PMH of vascular disease including AAA (s/p several procedures at [facility] in past), CAD, GERD, dementia, and atrial fibrillation. He presented to [facility] ED 1/23 with complaints of left sided back pain radiating to pelvis, which had been progressive for the past week. BP labile in ED and based on his history he was sent for CT angiogram to assess for AAA. CT demonstrated large AAA with endoleak and rupture. Initially his surgical team at [facility] was called for transfer, however, he then became more hypotensive and lethargic requiring intubation and was transferred to [facility] for emergent vascular surgery evaluation. He was taken to the OR under Dr. Trula Slade for coil embolization of R renal artery and repair of ruptured aortic aneurysm. The procedure was without complication and he was sent to the PACU and eventually ICU on ventilator.  SUBJECTIVE:  Tolerated PS 8/5 this am x 2 hours, then back on full support r/t increased RR.  Still with copious secretions.  1 unit PRBC overnight for hgb 7.1.    VITAL SIGNS: BP 136/85   Pulse 92   Temp (!) 100.5 F (38.1 C) (Axillary)   Resp (!) 26   Ht 5\' 10"  (1.778 m)   Wt 85.1 kg (187 lb 9.8 oz)   SpO2 98%   BMI 26.92 kg/m   HEMODYNAMICS: CVP:  [0 mmHg-12 mmHg] 4 mmHg  VENTILATOR SETTINGS: Vent Mode: PRVC FiO2 (%):  [40 %] 40 % Set Rate:  [12 bmp] 12 bmp Vt Set:  [580 mL] 580 mL PEEP:  [5 cmH20] 5 cmH20 Pressure Support:  [8 cmH20-10 cmH20] 8 cmH20 Plateau Pressure:  [13 cmH20-18 cmH20] 18 cmH20  INTAKE / OUTPUT: I/O last 3 completed shifts: In: 4767.1 [I.V.:2790.4; Blood:333; Other:30; NG/GT:1613.7] Out: 2525 [Urine:2525]  PHYSICAL  EXAMINATION:  General:  Chronically ill appearing elderly male, NAD on vent  HEENT: MM pink/moist Neuro:  Sedated on vent, RASS -1, follows some commands, gen weakness  CV: s1s2 rrr, no m/r/g PULM: even/non-labored, scattered rhonchi  [id number], non-tender, bsx4 active  Extremities: warm/dry, no edema  Skin: no rashes or lesions   LABS:  BMET  Recent Labs Lab 10/31/16 0413 11/01/16 0452 11/02/16 0447  NA 134* 133* 133*  K 4.5 5.0 5.1  CL 110 110 108  CO2 19* 18* 20*  BUN 36* 45* 57*  CREATININE 2.75* 2.93* 3.21*  GLUCOSE 125* 123* 120*    Electrolytes  Recent Labs Lab 10/31/16 0413 11/01/16 0452 11/02/16 0447  CALCIUM 7.7* 7.7* 8.0*  MG 2.0 2.1 2.1  PHOS 2.2* 2.0* 2.4*    CBC  Recent Labs Lab 10/31/16 0500 10/31/16 1630 11/01/16 0453 11/02/16 0447  WBC 11.0*  --  9.5 13.5*  HGB 7.6* 7.4* 7.1* 8.4*  HCT 23.3* 22.3* 21.5* 24.9*  PLT 201  --  214 245    Coag's  Recent Labs Lab 10/24/2016 1047 11/03/2016 1751  APTT  --  37*  INR 1.77 1.97    Sepsis Markers  Recent Labs Lab 10/28/16 0028 11/01/16 1519 11/02/16 0447  LATICACIDVEN 1.0  --   --   PROCALCITON  --  0.58 0.80    ABG  Recent Labs Lab 10/28/2016 1853 10/31/16 0406 11/01/16  0431  PHART 7.344* 7.367 7.323*  PCO2ART 38.3 33.8 35.7  PO2ART 256.0* 106.0 97.0    Liver Enzymes  Recent Labs Lab 10/30/2016 1047 10/28/16 0145  10/31/16 0413 11/01/16 0452 11/02/16 0447  AST 18 14*  --   --   --   --   ALT 14* 10*  --   --   --   --   ALKPHOS 88 59  --   --   --   --   BILITOT 2.0* 1.0  --   --   --   --   ALBUMIN 3.7 2.9*  < > 2.1* 1.9* 2.0*  < > = values in this interval not displayed.  Cardiac Enzymes  Recent Labs Lab 10/16/2016 2109 10/28/16 1214  TROPONINI 0.06* 0.08*    Glucose  Recent Labs Lab 11/01/16 1217 11/01/16 1719 11/01/16 2102 11/01/16 2344 11/02/16 0451 11/02/16 0801  GLUCAP 107* 111* 103* 100* 114* 114*    Imaging Dg Chest Port 1  View  Result Date: 11/02/2016 CLINICAL DATA:  Cute respiratory failure, intubated patient, history of bladder malignancy, coronary artery disease, peripheral vascular disease EXAM: PORTABLE CHEST 1 VIEW COMPARISON:  Portable chest x-ray of November 01, 2016 FINDINGS: The lungs remain well-expanded. Interstitial and alveolar opacities are more conspicuous bilaterally. The cardiac silhouette is enlarged. The pulmonary vascularity is engorged and indistinct. There is a small left pleural effusion. There is no pneumothorax. A descending thoracic aortic stent graft is present. The endotracheal tube tip projects approximately 5.4 cm above the carina. The esophagogastric tube tip projects below the inferior margin of the image and terminate terminates in the gastric cardia. The right internal jugular venous catheter tip projects over the midportion of the SVC. IMPRESSION: Worsening appearance of the pulmonary interstitium consistent with CHF with interstitial edema. A small left pleural effusion is present. One cannot exclude bibasilar atelectasis or developing pneumonia. Electronically Signed   By: David  Martinique M.D.   On: 11/02/2016 07:25   Dg Abd Portable 1v  Result Date: 11/01/2016 CLINICAL DATA:  83 y/o  M; feeding tube placement. EXAM: PORTABLE ABDOMEN - 1 VIEW COMPARISON:  10/31/2016 abdomen radiographs. FINDINGS: Enteric tube is coiled within the stomach. Multiple vascular stents of aorta and branch vessels with tortuous course of the aorta. Bones are demineralized. Nonobstructive bowel gas pattern. IMPRESSION: Enteric tube is coiled within the stomach. Electronically Signed   By: Kristine Garbe M.D.   On: 11/01/2016 13:29     STUDIES:  CTA CHEST/ABD/PELVIS 1/23: Abdominal aortic aneurysm rupture with retroperitoneal hemorrhage. Aortic aneurysm sac within the abdomen massively enlarged and measuring up to 15 cm secondary to large endoleak likely due to broken right renal artery stents. Delayed  filling of the right kidney secondary to complications with right renal artery stents. Small left pleural effusion. Small pulmonary nodules without change since 2011 poorly characterized. Port CXR 1/24:  Previously reviewed by me. Minimal silhouetting left hemidiaphragm.Endotracheal tube and enteric feedings to in good position. Right internal jugular central venous catheter in good position.  CULTURES: MRSA 1/23:  Negative  Sputum 1/28>>>few GNR, few GPR>>> BC x 2 1/28>>>  ANTIBIOTICS: Cefuroxime (periop prophylaxis)  SIGNIFICANT EVENTS: 1/23 - AAA rupture, to OR, then ICU on vent 1/27 - weaning on PSV 1/28 - weaning on PSV, d/c aline   LINES/TUBES: OETT 7.5 1/23 >> RIJ CVL 1/23 >> L Rad Art Line 1/23 >> 1/28 OGT 1/23 >>  ASSESSMENT / PLAN:  PULMONARY A Acute Hypoxic Respiratory Failure  Bibasilar  Opacities - likely effusions with compressive atx P:   Vent support - 8cc/kg  F/u CXR  F/u ABG Plan for one-way extubation at some point - goal was to diurese and optimize respiratory status prior  Follow sputum culture    CARDIOVASCULAR A:  Abdominal Aortic Aneurysm Rupture Bradycardia - improved  H/O HLD, CAD, HTN P:  Post op care per VVS  Continue asa, statin  DNR  Hold further diuresis with declining renal function  PRN labetalol    RENAL A:  Acute Renal Failure  Hyperkalemia - resolving  P:   F/u chem  Hold further lasix for now  Replete electrolytes as needed    GASTROINTESTINAL A:   GERD Dysphagia  P:   NPO  PPI  TF  Continue colace   HEMATOLOGIC A:   Anemia secondary to acute blood loss  P:  F/u CBC  1 unit PRBC given 1/28   INFECTIOUS A:   Increased pulmonary secretions / low grade temp P:   Monitor blood and sputum cultures  Monitor fever, wbc trend off abx for now  F/u cxr    ENDOCRINE A:   No issues  P:   Trend glucose on BMP   NEUROLOGIC A:   Acute Encephalopathy secondary to shock  H/O dementia, neuropathy   P:    RASS Goal: 0  fent gtt - wean as able  Continue aricept    FAMILY  - Updates:  Son and grandson updated at length at bedside 1/29.  Need to determine timing for one-way extubation.  Son calling his sister who lives in [place] family meet or Palliative Care meeting due by: ongoing      [place] Time:  68 minutes    Nickolas Madrid, NP 11/02/2016  11:22 AM Pager: [phone] or [phone]  STAFF NOTE: Linwood Dibbles, MD FACP have personally reviewed patient's available data, including medical history, events of note, physical examination and test results as part of my evaluation. I have discussed with resident/NP and other care providers such as pharmacist, RN and RRT. In addition, I personally evaluated patient and elicited key findings of: not awake, sedated, ronchi moderate, weak, abdo soft, lower ext warm, he is not progressing well, rise crt with lasix,. pcxr with effusion/ ATX / ALI, will hold lasix and evaluate crt trend, he is NOT a candidate for HD and family also does not want, secretions remain an issue, ph noted, with rising crt dc lov, add sub q hep, I feel strongly that continued vent support will not alter his outcome, have updated med poa son and suggested comfortcare, he will call sister in [place] to discuss, wean ps 10-12 as able The patient is critically ill with multiple organ systems failure and requires high complexity decisin making for assessment and support, frequent evaluation and titration of therapies, application of advanced monitoring technologies and extensive interpretation of multiple databases.   Critical Care Time devoted to patient care services described in this note is 30  Minutes. This time reflects time of care of this signee: Merrie Roof, MD FACP. This critical care time does not reflect procedure time, or teaching time or supervisory time of PA/NP/Med student/Med Resident etc but could involve care discussion time. Rest  per NP/medical resident whose note is outlined above and that I agree with   Lavon Paganini. Titus Mould, MD, [facility] Pgr: [place] [facility] 11/02/2016 11:57 AM

## 2016-11-02 NOTE — Progress Notes (Signed)
[place] Progress Note Patient Name: Miguel Swanson DOB: 05/19/1926 MRN: [id number]   Date of Service  11/02/2016  HPI/Events of Note  Self Extubated  Patient tachypneic with increased work of breathing. Spoke with son. He said that the sister is coming tomorrow and they have not made a final decision about one way extubation  eICU Interventions  Bedside MD informed about need for reintubation.     Intervention Category Major Interventions: Other:  Veona Bittman 11/02/2016, 11:08 PM

## 2016-11-02 NOTE — Plan of Care (Signed)
Problem: Nutritional: Goal: Intake of prescribed amount of daily calories will improve Outcome: Progressing Pt with nutrition following. On tube feeding for  caloric intake

## 2016-11-02 NOTE — Progress Notes (Signed)
  [facility] Progress Note  Subjective  - POD #6  Intubated and sedated. Does open eyes. Not following commands.  Objective Vitals:   11/02/16 0600 11/02/16 0700  BP: (!) 110/58 (!) 105/42  Pulse: 86 82  Resp: (!) 21 (!) 22  Temp:      Intake/Output Summary (Last 24 hours) at 11/02/16 0800 Last data filed at 11/02/16 0700  Gross per 24 hour  Intake          2755.09 ml  Output             2100 ml  Net           655.09 ml   Respirations even RRR Abdomen soft Right groin without hematoma. Palpable DP pulses b/l  Assessment/Planning: 83 y.o. male is s/p: plugging and coil embolization of right renal artery for rupture of AAA 6 Days Post-Op   Remains sedated and intubated. One way extubation this week per PCCM. Creatinine continuing to rise. Good UOP.  Received one unit prbcs yesterday. Hemoglobin with appropriate increase.  Appreciate PCCM assistance with this patient.   Joelene Millin A Trinh 11/02/2016 8:00 AM --  Laboratory CBC    Component Value Date/Time   WBC 13.5 (H) 11/02/2016 0447   HGB 8.4 (L) 11/02/2016 0447   HCT 24.9 (L) 11/02/2016 0447   HCT 42 04/20/2014   PLT 245 11/02/2016 0447    BMET    Component Value Date/Time   NA 133 (L) 11/02/2016 0447   NA 139 08/23/2014   K 5.1 11/02/2016 0447   CL 108 11/02/2016 0447   CO2 20 (L) 11/02/2016 0447   GLUCOSE 120 (H) 11/02/2016 0447   BUN 57 (H) 11/02/2016 0447   BUN 28 (A) 08/23/2014   CREATININE 3.21 (H) 11/02/2016 0447   CALCIUM 8.0 (L) 11/02/2016 0447   CALCIUM 9.4 04/20/2014   GFRNONAA 16 (L) 11/02/2016 0447   GFRAA 18 (L) 11/02/2016 0447    COAG Lab Results  Component Value Date   INR 1.97 10/28/2016   INR 1.77 10/30/2016   INR 1.47 10/29/2012   No results found for: PTT  Antibiotics Anti-infectives    Start     Dose/Rate Route Frequency Ordered Stop   10/28/16 2100  cefUROXime (ZINACEF) 1.5 g in dextrose 5 % 50 mL IVPB     1.5 g 100 mL/hr over 30 Minutes  Intravenous Every 12 hours 10/28/16 1200 10/29/16 0934   10/26/2016 2030  cefUROXime (ZINACEF) 1.5 g in dextrose 5 % 50 mL IVPB     1.5 g 100 mL/hr over 30 Minutes Intravenous Every 12 hours 10/09/2016 1705 10/28/16 1014   10/26/2016 1430  ceFAZolin (ANCEF) IVPB 2 g/50 mL premix  Status:  Discontinued     2 g 100 mL/hr over 30 Minutes Intravenous  Once 10/14/2016 1423 10/06/2016 [place], PA-C [facility] Office: [phone] Pager: [phone] 11/02/2016 8:00 AM

## 2016-11-03 ENCOUNTER — Inpatient Hospital Stay (HOSPITAL_COMMUNITY): Payer: Medicare Other

## 2016-11-03 MED ORDER — MORPHINE BOLUS VIA INFUSION
5.0000 mg | INTRAVENOUS | Status: DC | PRN
  Filled 2016-11-03: qty 20

## 2016-11-03 MED ORDER — SODIUM CHLORIDE 0.9 % IV SOLN
1.0000 mg/h | INTRAVENOUS | Status: DC
  Administered 2016-11-04 – 2016-11-05 (×2): 10 mg/h via INTRAVENOUS
  Filled 2016-11-03 (×3): qty 10

## 2016-11-03 MED ORDER — SODIUM CHLORIDE 0.9 % IV SOLN
1.0000 mg/h | INTRAVENOUS | Status: DC
  Administered 2016-11-03: 10 mg/h via INTRAVENOUS
  Filled 2016-11-03: qty 10

## 2016-11-03 NOTE — Progress Notes (Signed)
PCCM Attending Note: Patient's respiratory status still declining in terms of fatigue. Family now at bedside. We discussed reintubation versus proceeding with full palliation. Family at bedside are all in agreement with full palliation and moving towards comfort. I offered spiritual support from hospital chaplain. Nurse notified of family's decision. Order placed for morphine drip to start for patient's comfort per the comfort care order set.  Sonia Baller Ashok Cordia, M.D. [facility] Pulmonary & Critical Care Pager:  [phone] After 3pm or if no response, call [phone] 12:37 AM 11/03/16

## 2016-11-03 NOTE — Progress Notes (Signed)
Pt now extubated Family at bedside Comfort measures in place Patient looks comfortable All of family's questions answered.  Miguel Swanson

## 2016-11-03 NOTE — Progress Notes (Signed)
RT removed patient from BiPAP per verbal order from MD Ashok Cordia) once patient's RR was lower and the patient was more comfortable.  RT applied [place] @ 4L.

## 2016-11-03 NOTE — Progress Notes (Signed)
RT NTS patient with one pass down both the left and right nare with copious amount of secretions suctioned out of both.  RT inserted nasal trumpet in the right nare.

## 2016-11-03 NOTE — Progress Notes (Signed)
PULMONARY / CRITICAL CARE MEDICINE   Name: Miguel Swanson MRN: [id number] DOB: 1926/07/05    ADMISSION DATE:  10/30/2016 CONSULTATION DATE: 10/08/2016   REFERRING MD: Harold Barban, M.D. / Vascular Surgery  CHIEF COMPLAINT: Ruptured R renal artery stent    BRIEF SUMMARY:   83 year old with PMH of vascular disease including AAA (s/p several procedures at [facility] in past), CAD, GERD, dementia, and atrial fibrillation. He presented to [facility] ED 1/23 with complaints of left sided back pain radiating to pelvis, which had been progressive for the past week. BP labile in ED and based on his history he was sent for CT angiogram to assess for AAA. CT demonstrated large AAA with endoleak and rupture. Initially his surgical team at [facility] was called for transfer, however, he then became more hypotensive and lethargic requiring intubation and was transferred to [facility] for emergent vascular surgery evaluation. He was taken to the OR under Dr. Trula Slade for coil embolization of R renal artery and repair of ruptured aortic aneurysm. The procedure was without complication and he was sent to the PACU and eventually ICU on ventilator.  SUBJECTIVE: On MSO4 drip and comfortable   VITAL SIGNS: BP (!) 105/53 (BP Location: Right Arm)   Pulse 84   Temp 98.7 F (37.1 C) (Axillary)   Resp (!) 22   Ht 5\' 10"  (1.778 m)   Wt 187 lb 9.8 oz (85.1 kg)   SpO2 96%   BMI 26.92 kg/m   HEMODYNAMICS: CVP:  [0 mmHg-11 mmHg] 11 mmHg  VENTILATOR SETTINGS: Vent Mode: Bi-Vent;PCV FiO2 (%):  [40 %] 40 % Set Rate:  [12 bmp-20 bmp] 20 bmp Vt Set:  [580 mL] 580 mL PEEP:  [5 cmH20-6 cmH20] 6 cmH20 Plateau Pressure:  [18 cmH20-22 cmH20] 22 cmH20  INTAKE / OUTPUT: I/O last 3 completed shifts: In: 2902.3 [I.V.:1233.3; Blood:303; Other:30; NG/GT:1336] Out: 2750 [Urine:2750]  PHYSICAL EXAMINATION:  General:  Chronically ill appearing elderly male,MSO4 drip HEENT: MM pink/moist Neuro:  Sedated t, RASS -1,  CV: s1s2 rrr,  no m/r/g PULM: even/non-labored, scattered rhonchi  [id number], non-tender, bsx4 active  Extremities: warm/dry, no edema  Skin: no rashes or lesions   LABS:  BMET  Recent Labs Lab 10/31/16 0413 11/01/16 0452 11/02/16 0447  NA 134* 133* 133*  K 4.5 5.0 5.1  CL 110 110 108  CO2 19* 18* 20*  BUN 36* 45* 57*  CREATININE 2.75* 2.93* 3.21*  GLUCOSE 125* 123* 120*    Electrolytes  Recent Labs Lab 10/31/16 0413 11/01/16 0452 11/02/16 0447  CALCIUM 7.7* 7.7* 8.0*  MG 2.0 2.1 2.1  PHOS 2.2* 2.0* 2.4*    CBC  Recent Labs Lab 10/31/16 0500 10/31/16 1630 11/01/16 0453 11/02/16 0447  WBC 11.0*  --  9.5 13.5*  HGB 7.6* 7.4* 7.1* 8.4*  HCT 23.3* 22.3* 21.5* 24.9*  PLT 201  --  214 245    Coag's  Recent Labs Lab 10/25/2016 1047 10/13/2016 1751  APTT  --  37*  INR 1.77 1.97    Sepsis Markers  Recent Labs Lab 10/28/16 0028 11/01/16 1519 11/02/16 0447  LATICACIDVEN 1.0  --   --   PROCALCITON  --  0.58 0.80    ABG  Recent Labs Lab 10/29/2016 1853 10/31/16 0406 11/01/16 0431  PHART 7.344* 7.367 7.323*  PCO2ART 38.3 33.8 35.7  PO2ART 256.0* 106.0 97.0    Liver Enzymes  Recent Labs Lab 11/01/2016 1047 10/28/16 0145  10/31/16 0413 11/01/16 0452 11/02/16 0447  AST 18 14*  --   --   --   --   ALT 14* 10*  --   --   --   --   ALKPHOS 88 59  --   --   --   --   BILITOT 2.0* 1.0  --   --   --   --   ALBUMIN 3.7 2.9*  < > 2.1* 1.9* 2.0*  < > = values in this interval not displayed.  Cardiac Enzymes  Recent Labs Lab 10/29/2016 2109 10/28/16 1214  TROPONINI 0.06* 0.08*    Glucose  Recent Labs Lab 11/02/16 0451 11/02/16 0801 11/02/16 1223 11/02/16 1530 11/02/16 1937 11/02/16 2336  GLUCAP 114* 114* 132* 107* 126* 136*    Imaging No results found.   STUDIES:  CTA CHEST/ABD/PELVIS 1/23: Abdominal aortic aneurysm rupture with retroperitoneal hemorrhage. Aortic aneurysm sac within the abdomen massively enlarged and measuring up to 15 cm  secondary to large endoleak likely due to broken right renal artery stents. Delayed filling of the right kidney secondary to complications with right renal artery stents. Small left pleural effusion. Small pulmonary nodules without change since 2011 poorly characterized. Port CXR 1/24:  Previously reviewed by me. Minimal silhouetting left hemidiaphragm.Endotracheal tube and enteric feedings to in good position. Right internal jugular central venous catheter in good position.  CULTURES: MRSA 1/23:  Negative  Sputum 1/28>>>few GNR, few GPR>>> BC x 2 1/28>>>  ANTIBIOTICS: Cefuroxime (periop prophylaxis)  SIGNIFICANT EVENTS: 1/23 - AAA rupture, to OR, then ICU on vent 1/27 - weaning on PSV 1/28 - weaning on PSV, d/c aline  1/30 self extubated and is comfort care  LINES/TUBES: OETT 7.5 1/23 >>1/30 RIJ CVL 1/23 >> L Rad Art Line 1/23 >> 1/28 OGT 1/23 >>  ASSESSMENT / PLAN:  PULMONARY A Acute Hypoxic Respiratory Failure  Bibasilar Opacities - likely effusions with compressive atx P:   Comfort care, post self extubation and extended discussions with family. Transfer to floor with MSO4 drip  CARDIOVASCULAR A:  Abdominal Aortic Aneurysm Rupture Bradycardia - improved  H/O HLD, CAD, HTN P:  Comfort care   RENAL A:  Acute Renal Failure  Hyperkalemia - resolving  P:   Comfort care    GASTROINTESTINAL A:   GERD Dysphagia  P:   Comfort care   HEMATOLOGIC A:   Anemia secondary to acute blood loss  P:  Comfort care   INFECTIOUS A:   Increased pulmonary secretions / low grade temp P:   Comfort care   ENDOCRINE A:   No issues  P:   Comfort care  NEUROLOGIC A:   Acute Encephalopathy secondary to shock  H/O dementia, neuropathy   P:   RASS Goal: 0   Comfort care   FAMILY  - Updates:  Son and grandson updated at length at bedside 1/29.  Need to determine timing for one-way extubation.  Son calling his sister who lives in [place].  1/30 extended time with  family and they are in agreement with comfort care and MSO4 drip. Will transfer to floor for palliation.  - Inter-disciplinary family meet or Palliative Care meeting due by: ongoing      Richardson Landry Minor ACNP Maryanna Shape PCCM Pager [phone] till 3 pm If no answer page [phone] 11/03/2016, 9:07 AM   STAFF NOTE: I, Merrie Roof, MD FACP have personally reviewed patient's available data, including medical history, events of note, physical examination and test results as part of my evaluation. I have discussed with resident/NP  and other care providers such as pharmacist, RN and RRT. In addition, I personally evaluated patient and elicited key findings of: unresponsive, slight coarse, BS, after extesnive family meetings and daughter now visiting from [place] for comfort care and extubation, assessing rr to goal 12-18, no esclation O2 , mrophine to adjust to pain and rr, consider move to private room, I updated family in room in full The patient is critically ill with multiple organ systems failure and requires high complexity decision making for assessment and support, frequent evaluation and titration of therapies, application of advanced monitoring technologies and extensive interpretation of multiple databases.   Critical Care Time devoted to patient care services described in this note is 30 Minutes. This time reflects time of care of this signee: Merrie Roof, MD FACP. This critical care time does not reflect procedure time, or teaching time or supervisory time of PA/NP/Med student/Med Resident etc but could involve care discussion time. Rest per NP/medical resident whose note is outlined above and that I agree with   Lavon Paganini. Titus Mould, MD, [place] Pgr: [place] [facility] 11/03/2016 10:42 AM

## 2016-11-03 NOTE — Progress Notes (Signed)
[facility] [place]  Subjective  - End of life care family at bedside.   Objective (!) 105/53 84 98.7 F (37.1 C) (Axillary) (!) 22 96%  Intake/Output Summary (Last 24 hours) at 11/03/16 0852 Last data filed at 11/03/16 0800  Gross per 24 hour  Intake          1099.75 ml  Output             1865 ml  Net          -765.25 ml    Extubated O2 4L [place] Appears comfortable  Assessment/Planning: POD # 7 plugging and coil embolization of right renal artery for rupture of AAA 6 Days Post-Op   Palliative care, comfort care, DNR  Laurence Slate [facility] 11/03/2016 8:52 AM --  Laboratory Lab Results:  Recent Labs  11/01/16 0453 11/02/16 0447  WBC 9.5 13.5*  HGB 7.1* 8.4*  HCT 21.5* 24.9*  PLT 214 245   BMET  Recent Labs  11/01/16 0452 11/02/16 0447  NA 133* 133*  K 5.0 5.1  CL 110 108  CO2 18* 20*  GLUCOSE 123* 120*  BUN 45* 57*  CREATININE 2.93* 3.21*  CALCIUM 7.7* 8.0*    COAG Lab Results  Component Value Date   INR 1.97 10/30/2016   INR 1.77 11/04/2016   INR 1.47 10/29/2012   No results found for: PTT

## 2016-11-03 NOTE — Plan of Care (Signed)
Problem: Respiratory: Goal: Ability to maintain a clear airway and adequate ventilation will improve Outcome: Completed/Met Date Met: 11/03/16 Pt terminally extubated

## 2016-11-03 NOTE — Progress Notes (Signed)
RT NTS patient due to the patient having gurgling sounds and BiPAP was registering high peak pressures.  RT suctioned copious amounts of secretions.

## 2016-11-03 NOTE — Progress Notes (Signed)
Nutrition Follow Up/Brief Note  Chart reviewed. Pt now transitioning to comfort care.   TF (Vital AF 1.2) discontinued via OGT. No further nutrition interventions warranted at this time.  Please re-consult as needed.   Arthur Holms, RD, LDN Pager #: [phone] After-Hours Pager #: [phone]

## 2016-11-04 LAB — CULTURE, RESPIRATORY W GRAM STAIN: Culture: NORMAL

## 2016-11-04 LAB — CULTURE, RESPIRATORY: SPECIAL REQUESTS: NORMAL

## 2016-11-04 NOTE — Care Management Important Message (Signed)
Important Message  Patient Details  Name: Miguel Swanson MRN: [id number] Date of Birth: Sep 02, 1926   Medicare Important Message Given:  Yes    Orbie Pyo 11/04/2016, 12:46 PM

## 2016-11-05 MED ORDER — LORAZEPAM 2 MG/ML IJ SOLN
1.0000 mg | INTRAMUSCULAR | Status: DC | PRN
  Administered 2016-11-05 (×2): 1 mg via INTRAVENOUS
  Filled 2016-11-05 (×2): qty 1

## 2016-11-05 MED ORDER — SCOPOLAMINE 1 MG/3DAYS TD PT72: 1.0000 | MEDICATED_PATCH | TRANSDERMAL | Status: DC

## 2016-11-05 MED ORDER — GLYCOPYRROLATE 0.2 MG/ML IJ SOLN
0.1000 mg | INTRAMUSCULAR | Status: DC
  Administered 2016-11-05 (×3): 0.1 mg via INTRAVENOUS
  Filled 2016-11-05 (×3): qty 1

## 2016-11-05 MED ORDER — SCOPOLAMINE HBR 0.4 MG/ML IJ SOLN
0.4000 mg | Freq: Once | INTRAMUSCULAR | Status: DC
  Filled 2016-11-05: qty 1

## 2016-11-05 DEATH — deceased

## 2016-11-06 LAB — CULTURE, BLOOD (ROUTINE X 2)
Culture: NO GROWTH
Culture: NO GROWTH

## 2016-12-03 NOTE — Progress Notes (Signed)
Body transferred to the morgue.  

## 2016-12-03 NOTE — Progress Notes (Signed)
[place] made aware of the death. Body is not a candidate for any organ donation.

## 2016-12-03 NOTE — Progress Notes (Signed)
MD on call updated about the patient, new order given and noted.

## 2016-12-03 NOTE — Progress Notes (Signed)
     Patient in room appears comfortable Family at bedside.  End of life care on morphine drip and ativan PRN  Aerin Delany MAUREEN PA-C

## 2016-12-03 NOTE — Discharge Summary (Signed)
[facility] Death Summary   Patient ID:  Miguel Swanson MRN: [id number] DOB/AGE: 1926-09-14 83 y.o.  Admit date: 2016/11/01 Discharge date: deceased 2016/11/10 Date of Surgery: 01-Nov-2016 Surgeon: Surgeon(s): Serafina Mitchell, MD  Admission Diagnosis: Ruptured aortic aneurysm, unspecified portion of aorta [facility]) [I71.8]  Discharge Diagnoses:  Ruptured aortic aneurysm, unspecified portion of aorta ([place]) [I71.8]  Secondary Diagnoses: Past Medical History:  Diagnosis Date  . AAA (abdominal aortic aneurysm) ([place]) 12/2011, 9/20131   s/p endovasc graft repair [facility], repeat 06/2012  . CAD (coronary artery disease)   . Chronic low back pain    ddd  . Coronary atherosclerosis of unspecified type of vessel, native or graft    cabg  . Dysphagia   . Esophageal reflux   . Hyperlipidemia   . Malignant neoplasm of bladder, part unspecified   . Other diseases of lung, not elsewhere classified   . 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 Peripheral vascular disease, unspecified   . Unspecified essential hypertension     Procedure(s): EMBOLIZATION RIGHT RENAL ARTERY REPAIR RUPTURED ANEURYSM  Discharged Condition: Deseased  HPI: 83 y/o male was seen in the ED with complaints of left back pain radiating to left pelvic area into groin-started last week-denies dysuria.  History of thoracoabdominal aortic aneurysm and status post fenestrated endovascular repair with branch devices involving the celiac, SMA and bilateral renal arteries.Dr. Trula Slade exam the patient and reviewed the CTA.    CTA : IMPRESSION: Abdominal aortic aneurysm rupture with retroperitoneal hemorrhage. The abdominal aortic aneurysm sac has massively enlarged since 2015 and measures up to 15 cm. The aneurysm sac enlargement is secondary to a large endoleak which is probably due to the broken right renal artery stents. Blood is probably filling the aneurysm sac through the right renal artery stent.      Hospital Course:  Miguel Swanson is a 83 y.o. male is S/P Right Procedure(s):Nov 01, 2016 EMBOLIZATION RIGHT RENAL ARTERY REPAIR RUPTURED ANEURYSM Coil embolization, right renal artery  Consulted CCM patient remained intubated post op and transferred to the ICU.  HGB was 6.3 post op he received 1 unit PRBC  He received 4 units total intraoperative and post operatively combined.    POST-OP   S/p emergent embolization of right renal artery for active extravasation from renal stent separation resulting in aneurysm growth to 15cm and rupture  Patient remains intubated and stable on minimal blood pressure support Abdomen remains soft Palpable pedal pulses  GU:  Unsuccessful attempt x 2 of placement of foley in OR.  Patient spontaneously voided upon return to ICU and condom catheter placed.  Creatinine up to 1.6 from 1.3 pre-op.  In 2015, patient has a creatinine of 1.6.  I suspect this will continue to rise overnight.  He will likely need urology help for catheter placement.  Will check bladder scan to see if he is making any urine.  I discussed with the family that he may go into renal failure.  Acute blood loss anemia:  Most of the patient's blood loss is contained within his aneurysm and not in the RP space, which is why his abdomen remains soft.  His Hb was 10 post op and dropped to 6 after the case.  I suspect he was dehydrated and he is equilibrating.  I would imagine he bled at least 4-5 units of blood in his aneurysm sac.  He will get 2 units of blood now and repeat his CBC  Pulm:  OR duration was not very long,  so I would try to extubate him as soon as reasonably possible  Case discussed with CCM.  Appreciate their support  Given his age and presentation, his chance for a  Good outcome is low  10/29/2016  Decline of his medical condition with respiratory failure, renal failure, and underlying dementia. Discussion with family members and Dr. Ashok Cordia CCM We discussed the  fact that his renal function is continuing to worsen and with his renal dysfunction he is retaining some fluids which are likely contributing to his respiratory failure. We also discussed at this time it appears as though his bleeding has ceased. Further addressing his wishes family all seem to feel that he would not wish to undergo dialysis, tracheostomy, or PEG tube placement. We did discuss the eventual potential need for a one way extubation as well as the process of palliation with morphine should he have difficulty breathing postextubation. We will continue to support him as we await and hope for renal recovery.  11/02/2016 Self Extubated  Patient tachypneic with increased work of breathing.Spoke with son. He said that the sister is coming tomorrow and they have not made a final decision about one way extubation.  11/03/2016 The patient is critically ill with multiple organ systems failure.  Comfort measures in place Morphine drip and ativan.   Family at bedside.    11/23/16 4:43 pm Pt without pulse or respirations, pronouncement of death.  Family at bedside with pt at the time of death.  Comfort given to family. Pronouncement of death completed by Corky Crafts, RN also.  Significant Diagnostic Studies: CBC Lab Results  Component Value Date   WBC 13.5 (H) 11/02/2016   HGB 8.4 (L) 11/02/2016   HCT 24.9 (L) 11/02/2016   MCV 90.9 11/02/2016   PLT 245 11/02/2016    BMET    Component Value Date/Time   NA 133 (L) 11/02/2016 0447   NA 139 08/23/2014   K 5.1 11/02/2016 0447   CL 108 11/02/2016 0447   CO2 20 (L) 11/02/2016 0447   GLUCOSE 120 (H) 11/02/2016 0447   BUN 57 (H) 11/02/2016 0447   BUN 28 (A) 08/23/2014   CREATININE 3.21 (H) 11/02/2016 0447   CALCIUM 8.0 (L) 11/02/2016 0447   CALCIUM 9.4 04/20/2014   GFRNONAA 16 (L) 11/02/2016 0447   GFRAA 18 (L) 11/02/2016 0447   COAG Lab Results  Component Value Date   INR 1.97 10/10/2016   INR 1.77 10/17/2016   INR 1.47  10/29/2012     Disposition:  Discharge to :Deceased  Allergies as of 2016-11-23      Reactions   Lisinopril    Throat clearing      Medication List    ASK your doctor about these medications   atorvastatin 40 MG tablet Commonly known as:  LIPITOR TAKE 1 TABLET BY MOUTH EVERY EVENING FOR HIGH CHOLESTEROL   donepezil 10 MG tablet Commonly known as:  ARICEPT TAKE 1 TABLET BY MOUTH EVERY NIGHT AT BEDTIME   ELIQUIS 2.5 MG Tabs tablet Generic drug:  apixaban Take 2.5 mg by mouth 2 (two) times daily.   gabapentin 300 MG capsule Commonly known as:  NEURONTIN TAKE 1 CAPSULE BY MOUTH AT BEDTIME FOR NEUROPATHIC PAIN   losartan 50 MG tablet Commonly known as:  COZAAR TAKE 1 TABLET BY MOUTH DAILY   Melatonin 3 MG Tabs TAKE 1 TABLET BY MOUTH AT BEDTIME FOR SLEEP   multivitamin with minerals Tabs tablet Take 1 tablet by mouth daily.   polyethylene glycol  powder powder Commonly known as:  GLYCOLAX/MIRALAX USE 1 CAPFUL MIXED IN 8 OUNCES OF LIQUID DAILY FOR CONSTIPATION   ranitidine 150 MG tablet Commonly known as:  ZANTAC TAKE 1 TABLET BY MOUTH TWICE DAILY   rOPINIRole 0.25 MG tablet Commonly known as:  REQUIP TAKE 1 TABLET BY MOUTH 2 HOURS BEFORE BEDTIME   traZODone 50 MG tablet Commonly known as:  DESYREL TAKE 1 TO 2 TABLETS BY MOUTH EVERY EVENING      Verbal and written Discharge instructions given to the patient. Wound care per Discharge AVS   Signed: Laurence Slate [facility] 11/10/2016, 8:42 AM

## 2016-12-03 NOTE — Progress Notes (Signed)
Pt without pulse or respirations, pronouncement of death.  Family at bedside with pt at the time of death.  Comfort given to family. Pronouncement of death completed by Corky Crafts, RN also.

## 2016-12-03 DEATH — deceased

## 2017-04-27 IMAGING — DX DG ABD PORTABLE 1V
1 series · 1 of 1 positions shown · non-contrast
Comparison: 10/27/2016

CLINICAL DATA: Tube placement

EXAM:
PORTABLE ABDOMEN - 1 VIEW

[abdomen kub]
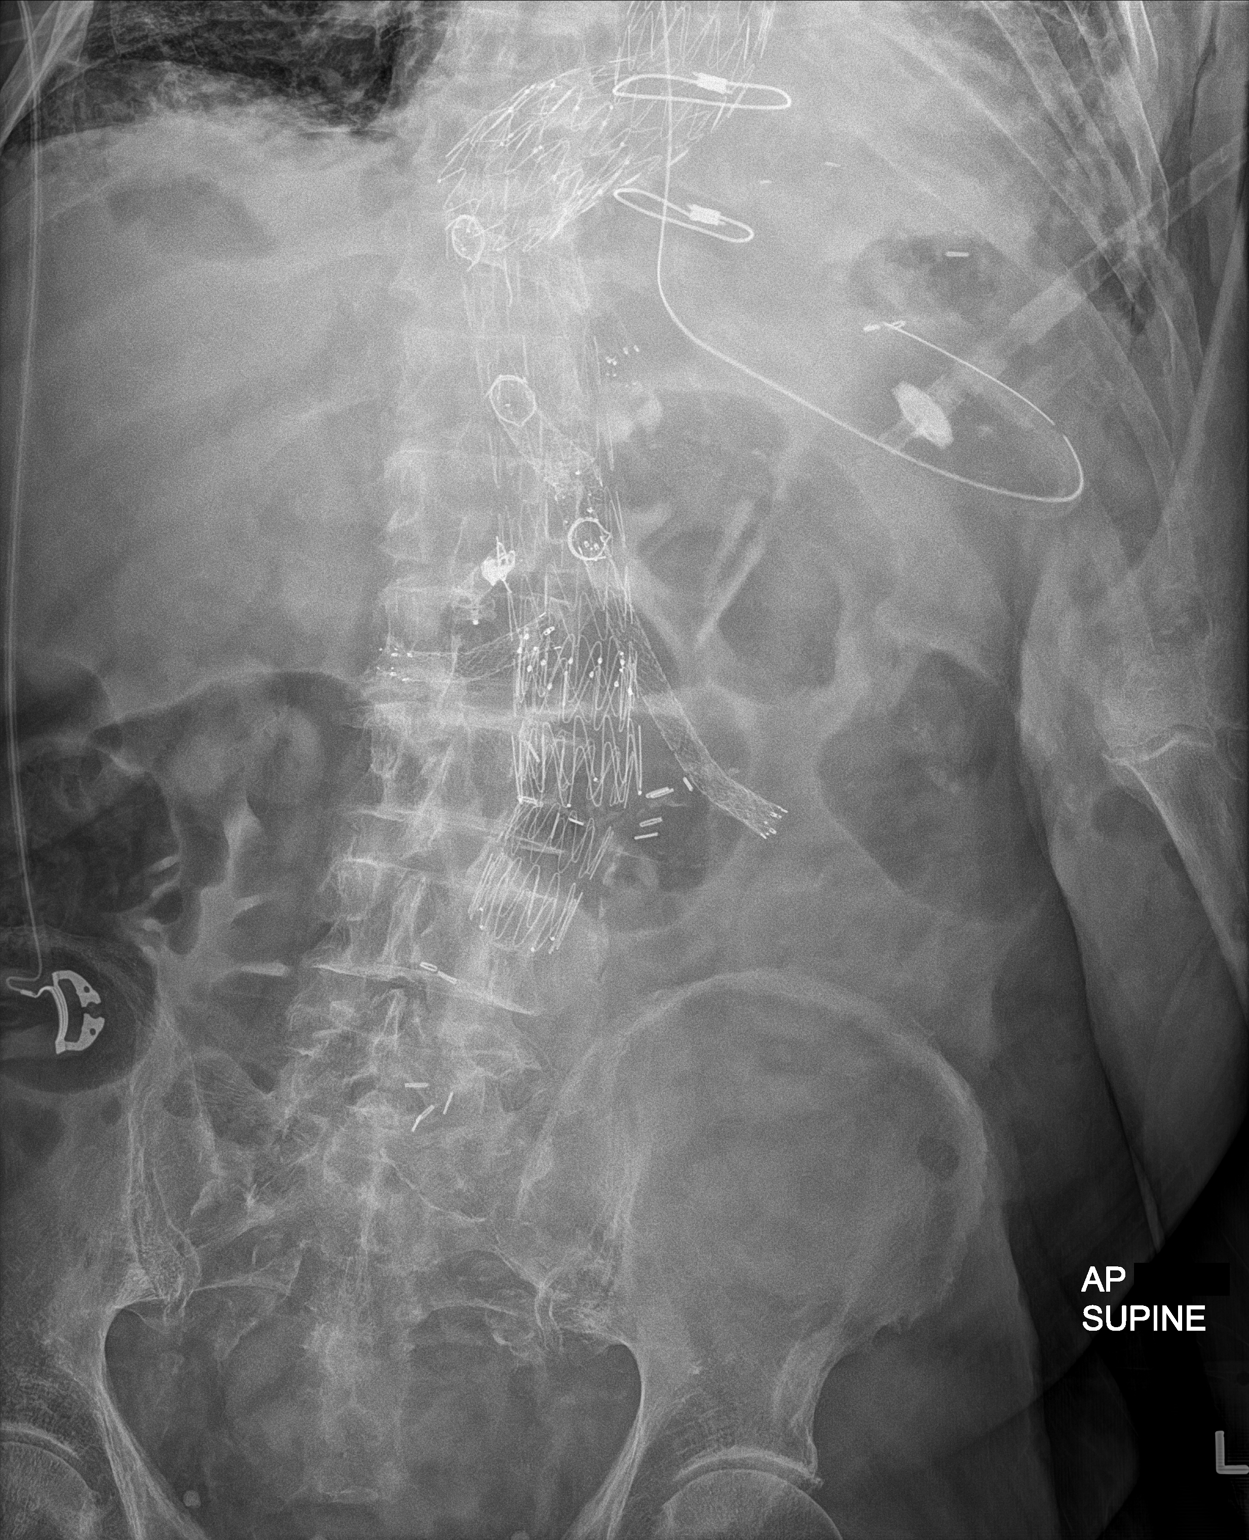

[1 of 1 positions shown; findings below may reference images not displayed]

FINDINGS: NG tube coils in the stomach. Stent graft noted in the abdominal
aorta. Nonobstructive bowel gas pattern. No free air.
IMPRESSION: NG tube coils in the stomach.
